# Patient Record
Sex: Female | Born: 1945 | Race: White | Hispanic: No | Smoking: Never smoker
Health system: Southern US, Community
[De-identification: ages and names within clinical notes are randomized; demographics above are authoritative.]

## PROBLEM LIST (undated history)

## (undated) DIAGNOSIS — E039 Hypothyroidism, unspecified: Secondary | ICD-10-CM

## (undated) DIAGNOSIS — I10 Essential (primary) hypertension: Secondary | ICD-10-CM

## (undated) DIAGNOSIS — C801 Malignant (primary) neoplasm, unspecified: Secondary | ICD-10-CM

## (undated) DIAGNOSIS — C50919 Malignant neoplasm of unspecified site of unspecified female breast: Secondary | ICD-10-CM

## (undated) DIAGNOSIS — N189 Chronic kidney disease, unspecified: Secondary | ICD-10-CM

## (undated) DIAGNOSIS — M1990 Unspecified osteoarthritis, unspecified site: Secondary | ICD-10-CM

## (undated) HISTORY — PX: ABDOMINAL HYSTERECTOMY: SHX81

## (undated) HISTORY — DX: Essential (primary) hypertension: I10

## (undated) HISTORY — DX: Malignant (primary) neoplasm, unspecified: C80.1

## (undated) HISTORY — DX: Unspecified osteoarthritis, unspecified site: M19.90

## (undated) HISTORY — PX: BREAST SURGERY: SHX581

## (undated) HISTORY — DX: Malignant neoplasm of unspecified site of unspecified female breast: C50.919

---

## 2000-12-07 ENCOUNTER — Other Ambulatory Visit: Admission: RE | Admit: 2000-12-07 | Discharge: 2000-12-07 | Payer: Self-pay

## 2000-12-20 ENCOUNTER — Ambulatory Visit: Admission: RE | Admit: 2000-12-20 | Discharge: 2000-12-20 | Payer: Self-pay | Admitting: Gynecology

## 2000-12-27 ENCOUNTER — Encounter: Payer: Self-pay | Admitting: Gynecology

## 2001-01-02 ENCOUNTER — Inpatient Hospital Stay (HOSPITAL_COMMUNITY): Admission: RE | Admit: 2001-01-02 | Discharge: 2001-01-04 | Payer: Self-pay

## 2001-01-02 ENCOUNTER — Encounter (INDEPENDENT_AMBULATORY_CARE_PROVIDER_SITE_OTHER): Payer: Self-pay

## 2001-01-02 ENCOUNTER — Encounter (INDEPENDENT_AMBULATORY_CARE_PROVIDER_SITE_OTHER): Payer: Self-pay | Admitting: Specialist

## 2001-01-09 ENCOUNTER — Ambulatory Visit: Admission: RE | Admit: 2001-01-09 | Discharge: 2001-01-09 | Payer: Self-pay | Admitting: Gynecology

## 2001-12-14 ENCOUNTER — Encounter: Payer: Self-pay | Admitting: Urology

## 2001-12-14 ENCOUNTER — Encounter: Admission: RE | Admit: 2001-12-14 | Discharge: 2001-12-14 | Payer: Self-pay | Admitting: Urology

## 2002-08-28 ENCOUNTER — Other Ambulatory Visit: Admission: RE | Admit: 2002-08-28 | Discharge: 2002-08-28 | Payer: Self-pay

## 2003-08-07 ENCOUNTER — Other Ambulatory Visit: Admission: RE | Admit: 2003-08-07 | Discharge: 2003-08-07 | Payer: Self-pay | Admitting: Obstetrics and Gynecology

## 2004-02-12 ENCOUNTER — Other Ambulatory Visit: Admission: RE | Admit: 2004-02-12 | Discharge: 2004-02-12 | Payer: Self-pay | Admitting: Obstetrics and Gynecology

## 2004-04-27 ENCOUNTER — Encounter: Admission: RE | Admit: 2004-04-27 | Discharge: 2004-04-27 | Payer: Self-pay | Admitting: General Surgery

## 2004-05-03 ENCOUNTER — Encounter: Admission: RE | Admit: 2004-05-03 | Discharge: 2004-05-03 | Payer: Self-pay | Admitting: General Surgery

## 2004-05-11 ENCOUNTER — Encounter: Admission: RE | Admit: 2004-05-11 | Discharge: 2004-05-11 | Payer: Self-pay | Admitting: General Surgery

## 2004-05-13 ENCOUNTER — Encounter (INDEPENDENT_AMBULATORY_CARE_PROVIDER_SITE_OTHER): Payer: Self-pay | Admitting: Specialist

## 2004-05-13 ENCOUNTER — Ambulatory Visit (HOSPITAL_COMMUNITY): Admission: RE | Admit: 2004-05-13 | Discharge: 2004-05-13 | Payer: Self-pay | Admitting: General Surgery

## 2004-05-13 ENCOUNTER — Ambulatory Visit (HOSPITAL_BASED_OUTPATIENT_CLINIC_OR_DEPARTMENT_OTHER): Admission: RE | Admit: 2004-05-13 | Discharge: 2004-05-13 | Payer: Self-pay | Admitting: General Surgery

## 2004-05-18 ENCOUNTER — Ambulatory Visit: Payer: Self-pay | Admitting: Oncology

## 2004-06-04 ENCOUNTER — Ambulatory Visit (HOSPITAL_COMMUNITY): Admission: RE | Admit: 2004-06-04 | Discharge: 2004-06-04 | Payer: Self-pay | Admitting: Oncology

## 2004-06-08 ENCOUNTER — Ambulatory Visit (HOSPITAL_COMMUNITY): Admission: RE | Admit: 2004-06-08 | Discharge: 2004-06-08 | Payer: Self-pay | Admitting: General Surgery

## 2004-06-08 ENCOUNTER — Ambulatory Visit (HOSPITAL_BASED_OUTPATIENT_CLINIC_OR_DEPARTMENT_OTHER): Admission: RE | Admit: 2004-06-08 | Discharge: 2004-06-08 | Payer: Self-pay | Admitting: General Surgery

## 2004-06-10 ENCOUNTER — Ambulatory Visit (HOSPITAL_COMMUNITY): Admission: RE | Admit: 2004-06-10 | Discharge: 2004-06-10 | Payer: Self-pay | Admitting: Oncology

## 2004-06-10 ENCOUNTER — Encounter (INDEPENDENT_AMBULATORY_CARE_PROVIDER_SITE_OTHER): Payer: Self-pay | Admitting: *Deleted

## 2004-07-07 ENCOUNTER — Ambulatory Visit: Payer: Self-pay | Admitting: Oncology

## 2004-08-26 ENCOUNTER — Ambulatory Visit: Payer: Self-pay | Admitting: Oncology

## 2004-10-13 ENCOUNTER — Ambulatory Visit: Payer: Self-pay | Admitting: Oncology

## 2004-10-19 ENCOUNTER — Ambulatory Visit: Payer: Self-pay | Admitting: Oncology

## 2004-10-20 ENCOUNTER — Ambulatory Visit: Admission: RE | Admit: 2004-10-20 | Discharge: 2004-12-22 | Payer: Self-pay | Admitting: Radiation Oncology

## 2004-12-08 ENCOUNTER — Ambulatory Visit: Payer: Self-pay | Admitting: Oncology

## 2005-03-02 ENCOUNTER — Other Ambulatory Visit: Admission: RE | Admit: 2005-03-02 | Discharge: 2005-03-02 | Payer: Self-pay | Admitting: Obstetrics and Gynecology

## 2005-03-10 ENCOUNTER — Ambulatory Visit: Payer: Self-pay | Admitting: Oncology

## 2005-06-21 ENCOUNTER — Ambulatory Visit: Payer: Self-pay | Admitting: Oncology

## 2005-06-23 LAB — COMPREHENSIVE METABOLIC PANEL
ALT: 17 U/L (ref 0–40)
AST: 18 U/L (ref 0–37)
Chloride: 105 mEq/L (ref 96–112)
Creatinine, Ser: 1 mg/dL (ref 0.4–1.2)
Sodium: 142 mEq/L (ref 135–145)
Total Bilirubin: 0.4 mg/dL (ref 0.3–1.2)

## 2005-06-23 LAB — CBC WITH DIFFERENTIAL/PLATELET
Basophils Absolute: 0 10*3/uL (ref 0.0–0.1)
EOS%: 3.8 % (ref 0.0–7.0)
HCT: 36.4 % (ref 34.8–46.6)
HGB: 12.2 g/dL (ref 11.6–15.9)
LYMPH%: 15 % (ref 14.0–48.0)
MCH: 29.7 pg (ref 26.0–34.0)
MCV: 88.4 fL (ref 81.0–101.0)
MONO%: 8.2 % (ref 0.0–13.0)
NEUT%: 72.8 % (ref 39.6–76.8)

## 2005-06-23 LAB — CANCER ANTIGEN 27.29: CA 27.29: 16 U/mL (ref 0–39)

## 2005-07-21 ENCOUNTER — Encounter: Admission: RE | Admit: 2005-07-21 | Discharge: 2005-07-21 | Payer: Self-pay | Admitting: Oncology

## 2005-08-09 ENCOUNTER — Ambulatory Visit: Payer: Self-pay | Admitting: Oncology

## 2005-09-27 ENCOUNTER — Ambulatory Visit: Payer: Self-pay | Admitting: Oncology

## 2005-09-29 LAB — CBC WITH DIFFERENTIAL/PLATELET
BASO%: 1.2 % (ref 0.0–2.0)
EOS%: 3.3 % (ref 0.0–7.0)
HCT: 36.6 % (ref 34.8–46.6)
LYMPH%: 18.7 % (ref 14.0–48.0)
MCH: 29.9 pg (ref 26.0–34.0)
MCHC: 33.4 g/dL (ref 32.0–36.0)
NEUT%: 70.4 % (ref 39.6–76.8)
RBC: 4.09 10*6/uL (ref 3.70–5.32)
lymph#: 1.2 10*3/uL (ref 0.9–3.3)

## 2005-09-29 LAB — COMPREHENSIVE METABOLIC PANEL
AST: 15 U/L (ref 0–37)
Alkaline Phosphatase: 67 U/L (ref 39–117)
BUN: 24 mg/dL — ABNORMAL HIGH (ref 6–23)
Creatinine, Ser: 0.83 mg/dL (ref 0.40–1.20)
Potassium: 4 mEq/L (ref 3.5–5.3)

## 2005-09-29 LAB — CANCER ANTIGEN 27.29: CA 27.29: 16 U/mL (ref 0–39)

## 2006-01-08 ENCOUNTER — Ambulatory Visit: Payer: Self-pay | Admitting: Oncology

## 2006-01-11 LAB — COMPREHENSIVE METABOLIC PANEL
ALT: 14 U/L (ref 0–35)
Albumin: 4.2 g/dL (ref 3.5–5.2)
CO2: 27 mEq/L (ref 19–32)
Chloride: 105 mEq/L (ref 96–112)
Glucose, Bld: 171 mg/dL — ABNORMAL HIGH (ref 70–99)
Potassium: 3.7 mEq/L (ref 3.5–5.3)
Sodium: 142 mEq/L (ref 135–145)
Total Bilirubin: 0.3 mg/dL (ref 0.3–1.2)
Total Protein: 6.9 g/dL (ref 6.0–8.3)

## 2006-01-11 LAB — CBC WITH DIFFERENTIAL/PLATELET
BASO%: 0.3 % (ref 0.0–2.0)
Basophils Absolute: 0 10*3/uL (ref 0.0–0.1)
HCT: 36.8 % (ref 34.8–46.6)
HGB: 12.1 g/dL (ref 11.6–15.9)
LYMPH%: 12.5 % — ABNORMAL LOW (ref 14.0–48.0)
MCHC: 32.8 g/dL (ref 32.0–36.0)
MONO#: 0.4 10*3/uL (ref 0.1–0.9)
NEUT%: 77.2 % — ABNORMAL HIGH (ref 39.6–76.8)
Platelets: 197 10*3/uL (ref 145–400)
WBC: 6.1 10*3/uL (ref 3.9–10.0)

## 2006-01-11 LAB — LACTATE DEHYDROGENASE: LDH: 165 U/L (ref 94–250)

## 2006-01-16 ENCOUNTER — Encounter: Admission: RE | Admit: 2006-01-16 | Discharge: 2006-01-16 | Payer: Self-pay | Admitting: Oncology

## 2006-02-18 ENCOUNTER — Ambulatory Visit: Payer: Self-pay | Admitting: Oncology

## 2006-04-10 ENCOUNTER — Ambulatory Visit: Payer: Self-pay | Admitting: Oncology

## 2006-05-11 LAB — COMPREHENSIVE METABOLIC PANEL
BUN: 18 mg/dL (ref 6–23)
CO2: 26 mEq/L (ref 19–32)
Creatinine, Ser: 0.88 mg/dL (ref 0.40–1.20)
Glucose, Bld: 142 mg/dL — ABNORMAL HIGH (ref 70–99)
Sodium: 144 mEq/L (ref 135–145)
Total Bilirubin: 0.3 mg/dL (ref 0.3–1.2)
Total Protein: 6.7 g/dL (ref 6.0–8.3)

## 2006-05-11 LAB — CBC WITH DIFFERENTIAL/PLATELET
Basophils Absolute: 0 10*3/uL (ref 0.0–0.1)
Eosinophils Absolute: 0.2 10*3/uL (ref 0.0–0.5)
HCT: 35 % (ref 34.8–46.6)
LYMPH%: 13.8 % — ABNORMAL LOW (ref 14.0–48.0)
MONO#: 0.5 10*3/uL (ref 0.1–0.9)
NEUT#: 5.7 10*3/uL (ref 1.5–6.5)
NEUT%: 76.2 % (ref 39.6–76.8)
Platelets: 207 10*3/uL (ref 145–400)
WBC: 7.5 10*3/uL (ref 3.9–10.0)

## 2006-06-27 ENCOUNTER — Ambulatory Visit: Payer: Self-pay | Admitting: Oncology

## 2006-07-27 ENCOUNTER — Encounter: Admission: RE | Admit: 2006-07-27 | Discharge: 2006-07-27 | Payer: Self-pay | Admitting: Oncology

## 2006-09-11 ENCOUNTER — Ambulatory Visit: Payer: Self-pay | Admitting: Oncology

## 2006-09-14 LAB — COMPREHENSIVE METABOLIC PANEL
Albumin: 3.8 g/dL (ref 3.5–5.2)
Alkaline Phosphatase: 62 U/L (ref 39–117)
BUN: 24 mg/dL — ABNORMAL HIGH (ref 6–23)
Glucose, Bld: 154 mg/dL — ABNORMAL HIGH (ref 70–99)
Potassium: 3.8 mEq/L (ref 3.5–5.3)
Total Bilirubin: 0.3 mg/dL (ref 0.3–1.2)

## 2006-09-14 LAB — CBC WITH DIFFERENTIAL/PLATELET
Basophils Absolute: 0 10*3/uL (ref 0.0–0.1)
Eosinophils Absolute: 0.3 10*3/uL (ref 0.0–0.5)
HGB: 11.8 g/dL (ref 11.6–15.9)
LYMPH%: 14.7 % (ref 14.0–48.0)
MCV: 88.4 fL (ref 81.0–101.0)
MONO%: 7.2 % (ref 0.0–13.0)
NEUT#: 4.5 10*3/uL (ref 1.5–6.5)
Platelets: 175 10*3/uL (ref 145–400)

## 2006-09-14 LAB — LACTATE DEHYDROGENASE: LDH: 149 U/L (ref 94–250)

## 2006-09-14 LAB — CANCER ANTIGEN 27.29: CA 27.29: 12 U/mL (ref 0–39)

## 2006-10-31 ENCOUNTER — Ambulatory Visit: Payer: Self-pay | Admitting: Oncology

## 2006-12-12 ENCOUNTER — Ambulatory Visit: Payer: Self-pay | Admitting: Oncology

## 2006-12-19 ENCOUNTER — Encounter: Admission: RE | Admit: 2006-12-19 | Discharge: 2006-12-19 | Payer: Self-pay | Admitting: Oncology

## 2007-01-11 ENCOUNTER — Other Ambulatory Visit: Admission: RE | Admit: 2007-01-11 | Discharge: 2007-01-11 | Payer: Self-pay | Admitting: Obstetrics and Gynecology

## 2007-01-23 ENCOUNTER — Ambulatory Visit: Payer: Self-pay | Admitting: Oncology

## 2007-02-01 ENCOUNTER — Encounter: Admission: RE | Admit: 2007-02-01 | Discharge: 2007-02-01 | Payer: Self-pay | Admitting: Oncology

## 2007-03-01 LAB — CBC WITH DIFFERENTIAL/PLATELET
Basophils Absolute: 0.1 10*3/uL (ref 0.0–0.1)
Eosinophils Absolute: 0.3 10*3/uL (ref 0.0–0.5)
HGB: 11.9 g/dL (ref 11.6–15.9)
LYMPH%: 14.8 % (ref 14.0–48.0)
MONO#: 0.5 10*3/uL (ref 0.1–0.9)
NEUT#: 5.6 10*3/uL (ref 1.5–6.5)
Platelets: 228 10*3/uL (ref 145–400)
RBC: 4.09 10*6/uL (ref 3.70–5.32)
RDW: 14.8 % — ABNORMAL HIGH (ref 11.3–14.5)
WBC: 7.5 10*3/uL (ref 3.9–10.0)

## 2007-03-01 LAB — COMPREHENSIVE METABOLIC PANEL
Albumin: 3.8 g/dL (ref 3.5–5.2)
CO2: 25 mEq/L (ref 19–32)
Calcium: 9.1 mg/dL (ref 8.4–10.5)
Glucose, Bld: 158 mg/dL — ABNORMAL HIGH (ref 70–99)
Potassium: 3.6 mEq/L (ref 3.5–5.3)
Sodium: 142 mEq/L (ref 135–145)
Total Protein: 6.6 g/dL (ref 6.0–8.3)

## 2007-03-01 LAB — LACTATE DEHYDROGENASE: LDH: 149 U/L (ref 94–250)

## 2007-03-01 LAB — CANCER ANTIGEN 27.29: CA 27.29: 15 U/mL (ref 0–39)

## 2007-04-02 ENCOUNTER — Ambulatory Visit: Payer: Self-pay | Admitting: Oncology

## 2007-04-23 ENCOUNTER — Ambulatory Visit (HOSPITAL_BASED_OUTPATIENT_CLINIC_OR_DEPARTMENT_OTHER): Admission: RE | Admit: 2007-04-23 | Discharge: 2007-04-23 | Payer: Self-pay | Admitting: General Surgery

## 2007-08-02 ENCOUNTER — Encounter: Admission: RE | Admit: 2007-08-02 | Discharge: 2007-08-02 | Payer: Self-pay | Admitting: Oncology

## 2007-12-13 ENCOUNTER — Ambulatory Visit: Payer: Self-pay | Admitting: Oncology

## 2007-12-17 LAB — CBC WITH DIFFERENTIAL/PLATELET
BASO%: 0.3 % (ref 0.0–2.0)
EOS%: 1.6 % (ref 0.0–7.0)
MCH: 29.5 pg (ref 26.0–34.0)
MCHC: 33.6 g/dL (ref 32.0–36.0)
MONO%: 7.1 % (ref 0.0–13.0)
RBC: 4.33 10*6/uL (ref 3.70–5.32)
RDW: 15.3 % — ABNORMAL HIGH (ref 11.3–14.5)
lymph#: 1.1 10*3/uL (ref 0.9–3.3)

## 2007-12-18 LAB — COMPREHENSIVE METABOLIC PANEL
ALT: 14 U/L (ref 0–35)
AST: 13 U/L (ref 0–37)
Albumin: 4.2 g/dL (ref 3.5–5.2)
Calcium: 9.1 mg/dL (ref 8.4–10.5)
Chloride: 103 mEq/L (ref 96–112)
Creatinine, Ser: 0.94 mg/dL (ref 0.40–1.20)
Potassium: 3.7 mEq/L (ref 3.5–5.3)

## 2008-06-17 ENCOUNTER — Ambulatory Visit: Payer: Self-pay | Admitting: Oncology

## 2008-06-19 LAB — CBC WITH DIFFERENTIAL/PLATELET
Basophils Absolute: 0 10*3/uL (ref 0.0–0.1)
Eosinophils Absolute: 0.3 10*3/uL (ref 0.0–0.5)
HGB: 12.1 g/dL (ref 11.6–15.9)
MCV: 89.3 fL (ref 79.5–101.0)
MONO%: 6.4 % (ref 0.0–14.0)
NEUT#: 5.4 10*3/uL (ref 1.5–6.5)
RDW: 14.6 % — ABNORMAL HIGH (ref 11.2–14.5)

## 2008-06-20 LAB — COMPREHENSIVE METABOLIC PANEL
Albumin: 4 g/dL (ref 3.5–5.2)
Alkaline Phosphatase: 64 U/L (ref 39–117)
BUN: 22 mg/dL (ref 6–23)
CO2: 24 mEq/L (ref 19–32)
Calcium: 9.2 mg/dL (ref 8.4–10.5)
Chloride: 105 mEq/L (ref 96–112)
Glucose, Bld: 158 mg/dL — ABNORMAL HIGH (ref 70–99)
Potassium: 3.7 mEq/L (ref 3.5–5.3)

## 2008-06-20 LAB — CANCER ANTIGEN 27.29: CA 27.29: 11 U/mL (ref 0–39)

## 2008-06-20 LAB — VITAMIN D 25 HYDROXY (VIT D DEFICIENCY, FRACTURES): Vit D, 25-Hydroxy: 46 ng/mL (ref 30–89)

## 2008-06-20 LAB — LACTATE DEHYDROGENASE: LDH: 139 U/L (ref 94–250)

## 2008-08-28 ENCOUNTER — Encounter: Admission: RE | Admit: 2008-08-28 | Discharge: 2008-08-28 | Payer: Self-pay | Admitting: Oncology

## 2008-12-25 ENCOUNTER — Encounter: Admission: RE | Admit: 2008-12-25 | Discharge: 2008-12-25 | Payer: Self-pay | Admitting: Oncology

## 2008-12-30 ENCOUNTER — Ambulatory Visit: Payer: Self-pay | Admitting: Oncology

## 2009-01-01 ENCOUNTER — Ambulatory Visit (HOSPITAL_COMMUNITY): Admission: RE | Admit: 2009-01-01 | Discharge: 2009-01-01 | Payer: Self-pay | Admitting: Oncology

## 2009-01-01 LAB — CBC WITH DIFFERENTIAL/PLATELET
BASO%: 0.7 % (ref 0.0–2.0)
EOS%: 2.9 % (ref 0.0–7.0)
LYMPH%: 16.7 % (ref 14.0–49.7)
MCHC: 32.8 g/dL (ref 31.5–36.0)
MCV: 91.6 fL (ref 79.5–101.0)
MONO%: 7.4 % (ref 0.0–14.0)
Platelets: 210 10*3/uL (ref 145–400)
RBC: 3.92 10*6/uL (ref 3.70–5.45)

## 2009-01-02 LAB — COMPREHENSIVE METABOLIC PANEL
ALT: 13 U/L (ref 0–35)
Alkaline Phosphatase: 62 U/L (ref 39–117)
Sodium: 140 mEq/L (ref 135–145)
Total Bilirubin: 0.3 mg/dL (ref 0.3–1.2)
Total Protein: 6.9 g/dL (ref 6.0–8.3)

## 2009-01-02 LAB — CANCER ANTIGEN 27.29: CA 27.29: 11 U/mL (ref 0–39)

## 2009-07-15 ENCOUNTER — Ambulatory Visit: Payer: Self-pay | Admitting: Oncology

## 2009-07-16 LAB — CBC WITH DIFFERENTIAL/PLATELET
BASO%: 0.4 % (ref 0.0–2.0)
Eosinophils Absolute: 0.1 10*3/uL (ref 0.0–0.5)
HCT: 35.4 % (ref 34.8–46.6)
MCHC: 34.2 g/dL (ref 31.5–36.0)
MONO#: 0.4 10*3/uL (ref 0.1–0.9)
NEUT#: 4.8 10*3/uL (ref 1.5–6.5)
NEUT%: 73.4 % (ref 38.4–76.8)
WBC: 6.5 10*3/uL (ref 3.9–10.3)
lymph#: 1.2 10*3/uL (ref 0.9–3.3)

## 2009-07-17 LAB — COMPREHENSIVE METABOLIC PANEL
ALT: 15 U/L (ref 0–35)
CO2: 24 mEq/L (ref 19–32)
Calcium: 9.4 mg/dL (ref 8.4–10.5)
Chloride: 102 mEq/L (ref 96–112)
Creatinine, Ser: 1.06 mg/dL (ref 0.40–1.20)
Glucose, Bld: 143 mg/dL — ABNORMAL HIGH (ref 70–99)
Sodium: 140 mEq/L (ref 135–145)
Total Protein: 6.8 g/dL (ref 6.0–8.3)

## 2009-07-17 LAB — LACTATE DEHYDROGENASE: LDH: 147 U/L (ref 94–250)

## 2009-09-03 ENCOUNTER — Encounter: Admission: RE | Admit: 2009-09-03 | Discharge: 2009-09-03 | Payer: Self-pay | Admitting: General Surgery

## 2010-02-25 ENCOUNTER — Ambulatory Visit: Payer: Self-pay | Admitting: Oncology

## 2010-02-25 LAB — CBC WITH DIFFERENTIAL/PLATELET
BASO%: 0.2 % (ref 0.0–2.0)
Basophils Absolute: 0 10*3/uL (ref 0.0–0.1)
EOS%: 2.5 % (ref 0.0–7.0)
Eosinophils Absolute: 0.2 10*3/uL (ref 0.0–0.5)
HCT: 34.9 % (ref 34.8–46.6)
HGB: 11.6 g/dL (ref 11.6–15.9)
LYMPH%: 14.7 % (ref 14.0–49.7)
MCH: 29.9 pg (ref 25.1–34.0)
MCHC: 33.2 g/dL (ref 31.5–36.0)
MCV: 90 fL (ref 79.5–101.0)
MONO#: 0.5 10*3/uL (ref 0.1–0.9)
MONO%: 6.9 % (ref 0.0–14.0)
NEUT#: 5.2 10*3/uL (ref 1.5–6.5)
NEUT%: 75.7 % (ref 38.4–76.8)
Platelets: 219 10*3/uL (ref 145–400)
RBC: 3.88 10*6/uL (ref 3.70–5.45)
RDW: 15.5 % — ABNORMAL HIGH (ref 11.2–14.5)
WBC: 6.9 10*3/uL (ref 3.9–10.3)
lymph#: 1 10*3/uL (ref 0.9–3.3)

## 2010-02-26 LAB — COMPREHENSIVE METABOLIC PANEL
ALT: 13 U/L (ref 0–35)
AST: 17 U/L (ref 0–37)
Albumin: 4.1 g/dL (ref 3.5–5.2)
Alkaline Phosphatase: 65 U/L (ref 39–117)
BUN: 29 mg/dL — ABNORMAL HIGH (ref 6–23)
CO2: 25 mEq/L (ref 19–32)
Calcium: 9.5 mg/dL (ref 8.4–10.5)
Chloride: 103 mEq/L (ref 96–112)
Creatinine, Ser: 1.34 mg/dL — ABNORMAL HIGH (ref 0.40–1.20)
Glucose, Bld: 124 mg/dL — ABNORMAL HIGH (ref 70–99)
Potassium: 3.9 mEq/L (ref 3.5–5.3)
Sodium: 141 mEq/L (ref 135–145)
Total Bilirubin: 0.4 mg/dL (ref 0.3–1.2)
Total Protein: 7.1 g/dL (ref 6.0–8.3)

## 2010-02-26 LAB — LACTATE DEHYDROGENASE: LDH: 160 U/L (ref 94–250)

## 2010-02-26 LAB — VITAMIN D 25 HYDROXY (VIT D DEFICIENCY, FRACTURES): Vit D, 25-Hydroxy: 116 ng/mL — ABNORMAL HIGH (ref 30–89)

## 2010-02-26 LAB — CANCER ANTIGEN 27.29: CA 27.29: 30 U/mL (ref 0–39)

## 2010-03-14 ENCOUNTER — Encounter: Payer: Self-pay | Admitting: Oncology

## 2010-04-08 ENCOUNTER — Other Ambulatory Visit: Payer: Self-pay | Admitting: Oncology

## 2010-04-08 DIAGNOSIS — Z853 Personal history of malignant neoplasm of breast: Secondary | ICD-10-CM

## 2010-04-08 DIAGNOSIS — Z9889 Other specified postprocedural states: Secondary | ICD-10-CM

## 2010-04-08 DIAGNOSIS — Z780 Asymptomatic menopausal state: Secondary | ICD-10-CM

## 2010-06-29 ENCOUNTER — Encounter (INDEPENDENT_AMBULATORY_CARE_PROVIDER_SITE_OTHER): Payer: Self-pay | Admitting: General Surgery

## 2010-07-06 NOTE — Op Note (Signed)
[name], Monica Morrison              ACCOUNT NO.:  [id number]   MEDICAL RECORD NO.:  [id number]          PATIENT TYPE:  AMB   LOCATION:  DSC                          FACILITY:  [facility]   PHYSICIAN:  Angelia Mould. Derrell Lolling, M.D.DATE OF BIRTH:  Mar 29, 1945   DATE OF PROCEDURE:  04/23/2007  DATE OF DISCHARGE:                               OPERATIVE REPORT   PREOPERATIVE DIAGNOSIS:  Breast cancer.   POSTOPERATIVE DIAGNOSIS:  Breast cancer.   OPERATION PERFORMED:  Removal of Port-A-Cath.   SURGEON:  Angelia Mould. Derrell Lolling, M.D.   OPERATIVE INDICATIONS:  This is a 65 year old white female who underwent  a left partial mastectomy and left axillary lymph node dissection on  May 13, 2004.  She had 26 positive lymph nodes, stage T2, N2, receptor  positive.  She had chemotherapy and radiation therapy and is taking  Arimidex and is followed Dr. Pierce Crane.  She was referred back to me  at this time for removal of her port.  I have discussed the indications  and details and risks of this procedure with her, and she is very  comfortable and wants to go ahead with this.   OPERATIVE TECHNIQUE:  The patient was brought to the minor procedure  room at [facility].  She was placed supine on the operating  table.  I could palpate the port in the right infraclavicular area.  This area was prepped and draped in a sterile fashion.  The patient was  identified as correct patient, correct procedure. and correct site.  1%  Xylocaine with epinephrine was utilized as a local infiltration  anesthetic.   A transverse incision was made overlying the port, through the previous  scar.  Dissection was carried down through the subcutaneous tissue until  I encountered the capsule of the port.  The port was mobilized and  lifted up.  With a knife, I cut two Prolene sutures and then dissected  the port and the locking device and the catheter away from the scar  tissue, and I removed the catheter and the port  intact.  There was no  bleeding.  There was no sign of any fracture of the port or catheter.  The subcutaneous tissue was closed with interrupted sutures of 3-0  Vicryl.  The skin was closed with a running subcuticular suture of 4-0  Monocryl and Steri-Strips.  Clean bandages were placed and the patient  taken to the recovery room in stable condition.   ESTIMATED BLOOD LOSS:  About 2 mL.   COMPLICATIONS:  None.   COUNTS:  Sponge, needle, and instrument counts were correct.     Angelia Mould. Derrell Lolling, M.D.  Electronically Signed    HMI/MEDQ  D:  04/23/2007  T:  04/23/2007  Job:  [id number]

## 2010-07-09 NOTE — Discharge Summary (Signed)
[facility]  Patient:    Monica Morrison, Monica Morrison Visit Number: [id number] MRN: [id number]          Service Type: Attending:  [name]. Galen Daft, M.D. Dictated by:   [name]. Galen Daft, M.D.                             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  ADMITTING DIAGNOSIS:  Endometrial cancer.  PRINCIPAL DIAGNOSIS:  Endometrial cancer.  SECONDARY DIAGNOSES: 1. Diabetes. 2. High blood pressure.  PRINCIPAL PROCEDURE:  Total abdominal hysterectomy with bilateral salpingo-oophorectomy, lymph node sampling, and exploration.  SURGEONS:  [name]. Galen Daft, M.D., Rande Brunt. Clarke-Pearson, M.D.  ANESTHESIA:  General.  CONDITION AT DISCHARGE:  Improved and there were no complications of hospitalization.  HOSPITAL COURSE:  Patient was admitted for the planned procedure of hysterectomy with lymph node biopsies and oophorectomy.  The procedure went without difficulty.  The patient had a normal postoperative course with excellent return of bowel function.  She was discharged home on postoperative day #2 with full instructions regarding wound care, follow-up in the office, medications, and diet.  Final pathology came back with grade 1, stage I endometrial cancer with minimal invasion.  All lymph nodes were negative and there was no metastatic disease. Dictated by:   [name]. Galen Daft, M.D. Attending:  [name]. Galen Daft, M.D. DD:  01/11/01 TD:  01/11/01 Job: [id number]

## 2010-07-09 NOTE — Op Note (Signed)
Monica Morrison, Monica Morrison              ACCOUNT NO.:  [id number]   MEDICAL RECORD NO.:  [id number]          PATIENT TYPE:  AMB   LOCATION:  DSC                          FACILITY:  [facility]   PHYSICIAN:  Rose Phi. Maple Hudson, M.D.   DATE OF BIRTH:  October 25, 1945   DATE OF PROCEDURE:  06/08/2004  DATE OF DISCHARGE:                                 OPERATIVE REPORT   PREOPERATIVE DIAGNOSIS:  Carcinoma of the left breast.   POSTOPERATIVE DIAGNOSIS:  Carcinoma of the left breast.   OPERATION:  Insertion of Port-A-Cath under fluoroscopic control.   SURGEON:  Rose Phi. Maple Hudson, M.D.   ANESTHESIA:  MAC.   OPERATIVE PROCEDURE:  The patient was placed on the operating table with  arms down by the side and the right upper chest and neck prepped and draped  in the usual fashion.   Under local anesthesia, a right subclavian puncture was carried out without  difficulty and the guidewire inserted and proper positioning of it confirmed  by C-arm fluoroscopy.   Also under local anesthesia, I made an incision on the anterior chest wall  and developed a pocket for the implantable port. We then tunneled from the  puncture site and the pocket and passed the catheter through that and  connected it to the Bard export which was placed in the pocket and anchored  in place with two 2-0 Prolene sutures.   The catheter tip was then trimmed to go to the level of the fourth  interspace at the cavoatrial junction. We then passed a dilator and peel-  away sheath over the wire and removed the wire and the dilator and passed  the catheter through the peel-away sheath and then removed it.   Again, fluoroscopic control was used to be sure the tip was in the superior  vena cava and that there was no kinking in the system.   With this confirmed, we then closed incisions with 3-0 Vicryl and  subcuticular 4-0 Monocryl and Steri-Strips.   I then accessed the system to heparinize it fully and then remove the  needle.   Dressings  were applied and the patient transferred to the recovery room in  satisfactory condition having tolerated the procedure well.      PRY/MEDQ  D:  06/08/2004  T:  06/08/2004  Job:  [id number]

## 2010-07-09 NOTE — Op Note (Signed)
Monica Morrison, Monica Morrison          ACCOUNT NO.:  [id number]   MEDICAL RECORD NO.:  [id number]          PATIENT TYPE:  AMB   LOCATION:  DSC                          FACILITY:  [facility]   PHYSICIAN:  Rose Phi. Maple Hudson, M.D.   DATE OF BIRTH:  1945/12/15   DATE OF PROCEDURE:  05/13/2004  DATE OF DISCHARGE:                                 OPERATIVE REPORT   PREOPERATIVE DIAGNOSIS:  Stage II carcinoma left breast.   POSTOPERATIVE DIAGNOSIS:  Stage II carcinoma left breast.   OPERATION:  Left partial mastectomy with axillary lymph node dissection.   SURGEON:  Dr. Francina Ames.   ANESTHESIA:  General.   OPERATIVE PROCEDURE:  This patient had presented with a palpable mass in the  subareolar area of the left breast and a positive lymph node proven by  needle biopsy. She was then scheduled for partial mastectomy and an axillary  node dissection.   After suitable general endotracheal anesthesia was induced, the patient was  placed in the supine position with the arms extended on the arm board. After  prepping and draping the breast and axilla, a transverse elliptical incision  was outlined in the central portion of the breast, incorporating the  palpable mass as well as the nipple-areolar complex.  The incision was made  and I excised this central portion of the breast. This was oriented for the  pathologist and submitted for evaluation of the margins.   While that was being done, a transverse axillary incision was made with  dissection through the subcutaneous tissue to the clavipectoral fascia to  enter the low axilla. We exposed the pectoralis major muscle and then  dissected along it in a cephalad direction retracting it and exposing the  pectoralis minor and the clavipectoral fascia at the level of the axillary  vein. With both major and minor retracted and the vein exposed, we then  dissected out all the tissue inferior to the vein and from behind the minor  so that we got level 1 and  level 2 nodes. There were multiple palpable  positive nodes.  The long thoracic, thoracodorsal nerves were identified and  preserved and other vessels and nerves were clipped and divided.   Following the completion of axillary node dissection, we had good  hemostasis. I thoroughly irrigated the field with saline. A #19-French Blake  drain was inserted and brought out through a separate stab wound. We then  injected the subcutaneous tissue with the local anesthetic mixture, then  closed it in 2 layers of 3-0 Vicryl and staples.   The margins on the lumpectomy site showed some tumor at the deep margin and  I excised more deep margin, which put [place] on the pectoralis fascia at the  level of muscle. Then I was concerned about the inferolateral margin which  also was very close, so I excised more there to get a clear margin.   Following a completion of this with good hemostasis and thorough irrigation,  I again injected the tissue with the local anesthetic and then closed it in  2 layers with 3-0 Vicryl and staples.  Dressings were then applied  and the  patient transferred to the recovery room in satisfactory condition, having  tolerated procedure well.      PRY/MEDQ  D:  05/13/2004  T:  05/13/2004  Job:  [id number]

## 2010-07-09 NOTE — Consult Note (Signed)
[facility]  Patient:    Monica Morrison, Monica Morrison Visit Number: [id number] MRN: [id number]          Service Type: GON Location: GYN Attending Physician:  Jeannette Corpus Dictated by:   Rande Brunt. Clarke-Pearson, M.D. Proc. Date: 12/20/00 Admit Date:  12/20/2000   CC:         [name]. Galen Daft, M.D.             Meredith Staggers, M.D.             Telford Nab, R.N.                          Consultation Report  REASON FOR CONSULTATION:  [age]-year-old white female referred by Dr. [name]. Tuso for evaluation and management of a grade 2 endometrial carcinoma.  The patient had postmenopausal bleeding and was referred to Dr. Galen Daft, who obtained an endometrial biopsy showing a grade 2 endometrial adenocarcinoma.  She has minimal cramping but has no other pelvic symptoms. She denies any GI or GU symptoms, any weight loss or any constitutional symptoms.  The patient has no past gynecologic history.  PAST MEDICAL HISTORY:  Medical illnesses:  Hypertension, diabetes, and thyroid disease.  PAST SURGICAL HISTORY:  Thyroidectomy, tonsils and adenoidectomy, excision of cutaneous nevi.  OBSTETRICAL HISTORY:  Gravida 2 with twins who are 66 years of age.  SOCIAL HISTORY:  Patient does not smoke.  She works as a IT sales professional at [employer].  She lives with her mother who is in her 66s.  CURRENT MEDICATIONS: 1. Potassium chloride 8 mg b.i.d. 2. Verapamil ER 240 mg daily. 3. Levoxyl 200 mcg every day. 4. Glucovance 2.5/500 mg b.i.d. 5. Metoprolol 50 mg b.i.d. 6. Triamterene/hydrochlorothiazide 37.5/25 mg daily.  The patient had previously been taking Evista but that has been discontinued just recently; she has also been asked to discontinue taking baby aspirin in anticipation of surgery.  FAMILY HISTORY:  Family history is negative for gynecologic or colon cancer. The patient has a maternal aunt with breast cancer.  REVIEW OF SYSTEMS:   Essentially negative.  PHYSICAL EXAMINATION:  VITAL SIGNS:  Height 5 foot 3 inches.  Weight 163 pounds.  Blood pressure 148/90, pulse 70, respiratory rate 18.  GENERAL:  The patient is a healthy white female in no acute distress.  HEENT:  Negative.  NECK:  Supple without thyromegaly.  NODES:  There is no supraclavicular or inguinal adenopathy.  ABDOMEN:  Soft and nontender.  No masses, organomegaly, ascites or herniae are noted.  PELVIC:  EGBUS are normal.  Vagina is clean, well-supported.  Cervix is parous and clean.  No lesions are noted.  Bimanual and rectovaginal exam reveal no masses, induration or nodularity.  Her uterus is anterior, normal shape, size and consistency.  IMPRESSION:  Clinical stage I, grade 2 endometrial adenocarcinoma.  PLAN:  I recommend that the patient undergo exploratory laparotomy, total abdominal hysterectomy and bilateral salpingo-oophorectomy and probable pelvic and para-aortic lymphadenectomy for surgical treatment and staging.  We did discuss the risks of surgery including hemorrhage, infection, injury to adjacent viscera and thromboembolic complications as well as anesthetic risks and the increased risk of poor wound healing, given the fact that the patient is diabetic and moderately overweight.  She is further made aware that based on surgical findings, it may be recommended that she receive adjuvant therapy in the terms of radiation and/or chemotherapy.  All the patients questions are asked and answered.  We will proceed with surgery on November 12th in conjunction with Dr. Gwendalyn Ege. Dictated by:   Rande Brunt. Clarke-Pearson, M.D. Attending Physician:  Jeannette Corpus DD:  12/20/00 TD:  12/21/00 Job: [id number]

## 2010-07-09 NOTE — Discharge Summary (Signed)
[facility]  Patient:    Monica Morrison, MELLS Visit Number: [id number] MRN: [id number]          Service Type: GON Location: GYN Attending Physician:  Jeannette Corpus Dictated by:   [name]. Galen Daft, M.D. Admit Date:  01/09/2001 Discharge Date: 01/09/2001                             Discharge Summary  PREOPERATIVE DIAGNOSIS:  Endometrial cancer.  POSTOPERATIVE DIAGNOSIS:  Endometrial cancer, stage 1, grade 1.  PRINCIPAL PROCEDURE:  Total abdominal hysterectomy, bilateral salpingo-oophorectomy, and lymph node dissection.  SECONDARY PROCEDURE:  None.  COMPLICATIONS OF HOSPITALIZATION:  None.  CONDITION ON DISCHARGE:  Stable.  HOSPITAL COURSE:  The patient was admitted on January 02, 2001, she underwent a total abdominal hysterectomy, bilateral salpingo-oophorectomy, with lymph node dissection.  The surgery came back as endometrial cancer, stage 1, grade 1, and there was no evidence of metastatic disease.  She had a normal return to bowel function, and was discharged home after two postoperative days with full instructions regarding activity limits, wound care, followup in the office, and medications.  The patient had no complications. Dictated by:   [name]. Galen Daft, M.D. Attending Physician:  Jeannette Corpus DD:  03/21/01 TD:  03/21/01 Job: [id number]

## 2010-07-09 NOTE — Op Note (Signed)
[facility]  Patient:    Monica Morrison, Monica Morrison Visit Number: [id number] MRN: [id number]          Service Type: GYN Location: 4W [id number] [id number] Attending Physician:  Barbaraann Cao Proc. Date: 01/02/01 Admit Date:  01/02/2001   CC:         [name]. Galen Daft, M.D.  Telford Nab, R.N.   Operative Report  PREOPERATIVE DIAGNOSIS:  Grade 2 endometrial adenocarcinoma.  POSTOPERATIVE DIAGNOSIS:  Grade 2 endometrial adenocarcinoma.  PROCEDURE:  Exploratory laparotomy, total abdominal hysterectomy, bilateral salpingo-oophorectomy, pelvic and periaortic lymphadenectomy.  SURGEON:  Daniel L. Clarke-Pearson, M.D.  ASSISTANTS: 1. [name]. Galen Daft, M.D. 2. Telford Nab, R.N.  ANESTHESIA:  General with orotracheal tube.  ESTIMATED BLOOD LOSS:  150 cc.  SURGICAL FINDINGS:  At exploratory laparotomy, the upper abdomen including the diaphragm, liver, spleen, stomach, omentum, small and large bowel were normal. The only abnormality was that she had thickening of the sigmoid colon consistent with diverticulosis.  The uterus was normal.  The tubes and ovaries appeared normal.  The pelvic and periaortic lymph nodes appeared normal.  On frozen section, there was minimal myometrial invasion noted.  DESCRIPTION OF PROCEDURE:  The patient was brought to the operating room and placed under general anesthesia.  She was positioned in [place] stirrups in the modified lithotomy position.  The anterior abdominal wall, perineum, and vagina were prepped with Betadine, Foley catheter was placed, and the patient was draped.  The abdomen was entered through a midline incision.  Peritoneal washings were obtained from the pelvis.  The upper abdomen and pelvis explored with the above-noted findings.  A Bookwalter retractor was positioned, and the bowel was packed out of the pelvis.  The uterus was grasped with long Kelly clamps, the round ligaments were divided, and the  retroperitoneal space is opened, identifying the external iliac artery and vein, iliac artery and ureter.  The paravesical and pararectal spaces were opened.  The ureter was identified, the ovarian vessels isolated, clamped, cut, free-tied, and suture ligated using 2-0 Vicryl.  The bladder flap was advanced through sharp dissection.  The uterine vessels were skeletonized, clamped, cut, and suture ligated.  In a stepwise fashion, the paracervical and cardinal ligaments were clamped, cut, and suture ligated.  The pararectal space and rectovaginal septum were isolated and the uterosacral ligaments identified.  The uterosacral ligaments and vaginal angles were cross-clamped and divided.  The vagina was transected from its connection to the cervix.  The uterus, cervix, tubes and ovaries are handed off the operative field as a single specimen and submitted to frozen section.  The vaginal angles and the uterosacral ligaments were transfixed with 0 Vicryl.  The central portion of the vagina is closed with interrupted figure-of-eight sutures using 2-0 Vicryl.  Attention was turned to performing a pelvic lymphadenectomy. Bilateral pelvic lymphadenectomy was performed, removing lymph nodes from the external iliac artery and vein, internal iliac artery and obturator space.  These were submitted as two separate specimens with the external iliac nodes and obturator lymph nodes.  Throughout the dissection, hemostasis was achieved with hemoclips and electrocautery.  Care was taken to avoid injury to any of the blood vessels and the obturator nerve.  The Bookwalter retractor was repositioned.  The peritoneum overlying the right common iliac artery and aorta was identified and incised with cautery.  The dissection was carried from the right common iliac artery up to the retroperitoneal portion of the duodenum.  Further dissection identified the right ureter which was  reflected laterally, thus exposing lymph  nodes overlying the aorta and vena cava. Further dissection elevated the retroperitoneal portion of the duodenum to allow more cephalad access.  The lymph nodes over the aorta and vena cava were then excised with care taken to avoid injury to the inferior mesenteric artery.  The right ovarian artery was encountered and doubly clipped in the course of this dissection.  The lymph nodes were entirely excised and submitted to pathology as a single specimen.  The hemostasis in the para-aortic chain region was ascertained.  Attention was turned to the pelvis which was reinspected, hemostasis achieved. The pelvis was irrigated with saline.  The retractors and packs were removed, and the anterior abdominal wall closed in layers, the first being a running mass closure using #1 PDS.  The subcutaneous tissue was irrigated, hemostasis achieved with the cautery, and the skin closed with skin staples.  A dressing was applied.  The patient was taken to the recovery room in satisfactory condition.  Sponge, needle, and instrument counts correct x 2. Attending Physician:  Barbaraann Cao DD:  01/02/01 TD:  01/02/01 Job: [id number]

## 2010-07-09 NOTE — Consult Note (Signed)
[facility]  Patient:    Monica Morrison, Monica Morrison Visit Number: [id number] MRN: [id number]          Service Type: GON Location: GYN Attending Physician:  Jeannette Corpus Dictated by:   Rande Brunt. Clarke-Pearson, M.D. Proc. Date: 01/09/01 Admit Date:  01/09/2001   CC:         [name]. Galen Daft, M.D.  Telford Nab, R.N.   Consultation Report  REASON FOR FOLLOWUP:  [age]-year-old white female returns for her first postoperative checkup, having undergone a total abdominal hysterectomy, bilateral salpingo-oophorectomy and pelvic and para-aortic lymphadenectomy for endometrial carcinoma on January 02, 2001.  Final pathology showed a well-differentiated endometrial carcinoma (preoperative grade was grade 2) with focal invasion of the superficial myometrium.  All lymph nodes and washings were negative (stage IB, grade 1).  The patient has had an uncomplicated postoperative course.  PHYSICAL EXAMINATION:  ABDOMEN:  Soft and nontender.  Staples are removed from her midline incision and Steri-Strips are applied.  IMPRESSION:  Stage IB, grade 2 endometrial adenocarcinoma.  The patient is having a good postoperative recovery.  She will return to see Dr. [name]. Tuso for a six-week checkup and thereafter, would recommend that she be followed in his office with surveillance for a very low risk of recurrence.  We would suggest she have visits every three months in the first year, visits every four months in the second year and then every six months to complete five years of followup.  I would be happy to see her in followup as needed, however, her primary followup will be provided in Dr. Mardi Mainland office. Dictated by:   Rande Brunt. Clarke-Pearson, M.D. Attending Physician:  Jeannette Corpus DD:  01/09/01 TD:  01/10/01 Job: [id number]

## 2010-09-09 ENCOUNTER — Ambulatory Visit
Admission: RE | Admit: 2010-09-09 | Discharge: 2010-09-09 | Disposition: A | Discharge status: Home / Self Care | Payer: Medicare Other | Source: Ambulatory Visit | Attending: Oncology | Admitting: Oncology

## 2010-09-09 DIAGNOSIS — Z9889 Other specified postprocedural states: Secondary | ICD-10-CM

## 2010-11-01 ENCOUNTER — Encounter (INDEPENDENT_AMBULATORY_CARE_PROVIDER_SITE_OTHER): Payer: Self-pay | Admitting: General Surgery

## 2010-12-23 ENCOUNTER — Ambulatory Visit (INDEPENDENT_AMBULATORY_CARE_PROVIDER_SITE_OTHER): Payer: Medicare Other | Admitting: General Surgery

## 2010-12-23 ENCOUNTER — Encounter (INDEPENDENT_AMBULATORY_CARE_PROVIDER_SITE_OTHER): Payer: Self-pay | Admitting: General Surgery

## 2010-12-23 VITALS — BP 128/84 | HR 80 | Temp 98.2°F | Resp 16 | Wt 166.2 lb

## 2010-12-23 DIAGNOSIS — Z853 Personal history of malignant neoplasm of breast: Secondary | ICD-10-CM

## 2010-12-23 NOTE — Progress Notes (Signed)
Chief Complaint  Patient presents with  . Follow-up    Breast Cancer  [facility] - Left  PM; ALND 05/13/2004(Dr. PY) - T3,N1; 26 nodes pos, ER Pos., Her-2 neg.    HPI Monica Morrison is a 65 y.o. female.   HPI This pleasant woman returns for long-term followup regarding her breast cancer. She is followed annually by Dr. Pierce Crane every January. Her primary physician is Dr. Tally Joe.  On May 13, 2004 she underwent left partial mastectomy and left axillary lymph node dissection by Dr. Francina Ames. Pathologic stage is T2, N2, but was 26 nodes positive, receptor positive and HER-2 negative. She has no known recurrence to date. She had adjuvant chemotherapy. Her port has been removed. She had radiation therapy and she is on adjuvant anastrozole.  Her medical problems include arthritis, and non-insulin-dependent diabetes, hypertension.  Bilateral mammograms were performed on September 09, 2010. They show some scarring on the left buttock category 2 with no evidence of malignancy. She has no complaints about her breasts. She has very little in the way of left arm symptoms.  Past Medical History  Diagnosis Date  . Cancer     breast  . Hypertension   . Diabetes mellitus   . Arthritis     Past Surgical History  Procedure Date  . Abdominal hysterectomy   . Breast surgery     Family History  Problem Relation Age of Onset  . Diabetes Mother   . Hypertension Mother   . Diabetes Sister   . Hypertension Sister     Social History History  Substance Use Topics  . Smoking status: Never Smoker   . Smokeless tobacco: Not on file  . Alcohol Use: No    No Known Allergies  Current Outpatient Prescriptions  Medication Sig Dispense Refill  . alendronate (FOSAMAX) 70 MG tablet Take 70 mg by mouth every 7 (seven) days. Take with a full glass of water on an empty stomach.       Marland Kitchen anastrozole (ARIMIDEX) 1 MG tablet Take 1 mg by mouth daily.        . Calcium Carbonate (CALCIUM 500 PO) Take by  mouth.        Marland Kitchen glipiZIDE (GLUCOTROL) 5 MG tablet Take 5 mg by mouth 2 (two) times daily before a meal.        . levothyroxine (SYNTHROID, LEVOTHROID) 112 MCG tablet Take 112 mcg by mouth daily.        . metFORMIN (GLUMETZA) 500 MG (MOD) 24 hr tablet Take 500 mg by mouth daily with breakfast.        . METOPROLOL TARTRATE PO Take by mouth.        Marland Kitchen POTASSIUM PO Take by mouth.        . TRIAMTERENE-HCTZ PO Take by mouth.        . verapamil (COVERA HS) 240 MG (CO) 24 hr tablet Take 240 mg by mouth at bedtime.        . Flaxseed, Linseed, (FLAX SEED OIL) 1000 MG CAPS Take by mouth.        . LYSINE PO Take 20 mg by mouth daily.        . pioglitazone (ACTOS) 15 MG tablet Take 15 mg by mouth daily.          Review of Systems Review of Systems  Constitutional: Negative for fever, chills and unexpected weight change.  HENT: Negative for hearing loss, congestion, sore throat, trouble swallowing and voice change.   Eyes:  Negative for visual disturbance.  Respiratory: Negative for cough and wheezing.   Cardiovascular: Negative for chest pain, palpitations and leg swelling.  Gastrointestinal: Negative for nausea, vomiting, abdominal pain, diarrhea, constipation, blood in stool, abdominal distention and anal bleeding.  Genitourinary: Negative for hematuria, vaginal bleeding and difficulty urinating.  Musculoskeletal: Positive for arthralgias.  Skin: Negative for rash and wound.  Neurological: Negative for seizures, syncope and headaches.  Hematological: Negative for adenopathy. Does not bruise/bleed easily.  Psychiatric/Behavioral: Negative for confusion.    Blood pressure 128/84, pulse 80, temperature 98.2 F (36.8 C), temperature source Temporal, resp. rate 16, weight 166 lb 3.2 oz (75.388 kg).  Physical Exam Physical Exam  Eyes: Pupils are equal, round, and reactive to light. Right eye exhibits no discharge. Left eye exhibits no discharge. No scleral icterus.       Right conjunctiva slightly  erythrematous. ? allergies.  Neck: Normal range of motion. Neck supple. No JVD present. No tracheal deviation present. No thyromegaly present.       Old thyroidectomy scar  Cardiovascular: Normal rate, regular rhythm, normal heart sounds and intact distal pulses.   No murmur heard. Pulmonary/Chest: Effort normal and breath sounds normal. No stridor. No respiratory distress. She has no wheezes. She has no rales. She exhibits no tenderness.    Lymphadenopathy:    She has no cervical adenopathy.  Skin: Skin is warm and dry. No rash noted. No pallor.       Slight erythema left axilla. Chronic.    Data Reviewed I have reviewed her mammograms performed September 09, 2010.  Assessment    Invasive lobular carcinoma left breast, stage T2, N2 , receptor positive, 26 positive lymph nodes, no evidence of recurrence  6 years post op  Non-insulin-dependent diabetes mellitus  Hypertension     Plan    We had a long talk about long-term follow up and surveillance. She has excellent general medical care with Dr. Tally Joe and she sees Dr. Donnie Coffin annually. She was given the option of continuing to follow with me or see me on a p.r.n. basis. At this point in time her decision is to see Dr. Donnie Coffin annually and to see me on a p.r.n. basis. All standby should any further surgical problems arise.  Annual mammograms       Rubbie Goostree M 12/23/2010, 8:58 AM

## 2010-12-23 NOTE — Patient Instructions (Signed)
Your mammograms looked normal and your physical exam shows no evidence of recurrent cancer. I advise you do get mammograms every year in July. I advise you to continue annual followup with Dr. Donnie Coffin. Return to see me as needed.

## 2010-12-30 ENCOUNTER — Ambulatory Visit
Admission: RE | Admit: 2010-12-30 | Discharge: 2010-12-30 | Disposition: A | Discharge status: Home / Self Care | Payer: Medicare Other | Source: Ambulatory Visit | Attending: Oncology | Admitting: Oncology

## 2010-12-30 DIAGNOSIS — Z780 Asymptomatic menopausal state: Secondary | ICD-10-CM

## 2010-12-30 DIAGNOSIS — Z853 Personal history of malignant neoplasm of breast: Secondary | ICD-10-CM

## 2011-02-09 ENCOUNTER — Encounter: Payer: Self-pay | Admitting: Oncology

## 2011-02-11 ENCOUNTER — Telehealth: Payer: Self-pay | Admitting: Oncology

## 2011-02-11 NOTE — Telephone Encounter (Signed)
l.m for pt,appt r/s to later timwe and mailed appt info    aom

## 2011-02-24 ENCOUNTER — Other Ambulatory Visit: Payer: Self-pay | Admitting: Oncology

## 2011-02-24 ENCOUNTER — Encounter: Payer: Self-pay | Admitting: Oncology

## 2011-02-24 ENCOUNTER — Other Ambulatory Visit (HOSPITAL_BASED_OUTPATIENT_CLINIC_OR_DEPARTMENT_OTHER): Payer: Medicare Other | Admitting: Lab

## 2011-02-24 DIAGNOSIS — C50119 Malignant neoplasm of central portion of unspecified female breast: Secondary | ICD-10-CM

## 2011-02-24 LAB — CBC WITH DIFFERENTIAL/PLATELET
BASO%: 0.5 % (ref 0.0–2.0)
EOS%: 3.9 % (ref 0.0–7.0)
MCH: 29.2 pg (ref 25.1–34.0)
MCHC: 33.2 g/dL (ref 31.5–36.0)
MCV: 88 fL (ref 79.5–101.0)
MONO%: 7 % (ref 0.0–14.0)
RBC: 3.9 10*6/uL (ref 3.70–5.45)
RDW: 14.9 % — ABNORMAL HIGH (ref 11.2–14.5)

## 2011-02-25 LAB — COMPREHENSIVE METABOLIC PANEL
AST: 17 U/L (ref 0–37)
Albumin: 4.1 g/dL (ref 3.5–5.2)
Alkaline Phosphatase: 68 U/L (ref 39–117)
BUN: 30 mg/dL — ABNORMAL HIGH (ref 6–23)
Potassium: 3.9 mEq/L (ref 3.5–5.3)
Sodium: 142 mEq/L (ref 135–145)

## 2011-03-03 ENCOUNTER — Ambulatory Visit (HOSPITAL_BASED_OUTPATIENT_CLINIC_OR_DEPARTMENT_OTHER): Payer: Medicare Other | Admitting: Oncology

## 2011-03-03 VITALS — BP 162/95 | HR 72 | Temp 97.7°F | Ht 62.5 in | Wt 168.7 lb

## 2011-03-03 DIAGNOSIS — C50919 Malignant neoplasm of unspecified site of unspecified female breast: Secondary | ICD-10-CM

## 2011-03-03 NOTE — Progress Notes (Signed)
Hematology and Oncology Follow Up Visit  Monica Morrison [id number] Feb 09, 1946 66 y.o. 03/03/2011 3:30 PM PCP dr Tally Joe  Principle Diagnosis: multi-node + BC, s/p lumpectomy 2006; s/p TAC x 6, s/p xrt completed 9/06; on arimidex since.  Interim History:  There have been no intercurrent illness, hospitalizations or medication changes.  Medications: I have reviewed the patient's current medications.  Allergies: No Known Allergies  Past Medical History, Surgical history, Social history, and Family History were reviewed and updated.  Review of Systems: Constitutional:  Negative for fever, chills, night sweats, anorexia, weight loss, pain. Cardiovascular: no chest pain or dyspnea on exertion Respiratory: no cough, shortness of breath, or wheezing Neurological: negative Dermatological: negative ENT: negative Skin Gastrointestinal: no abdominal pain, change in bowel habits, or black or bloody stools Genito-Urinary: no dysuria, trouble voiding, or hematuria Hematological and Lymphatic: negative Breast: negative Musculoskeletal: negative Remaining ROS negative.  Physical Exam: Blood pressure 162/95, pulse 72, temperature 97.7 F (36.5 C), temperature source Oral, height 5' 2.5" (1.588 m), weight 168 lb 11.2 oz (76.522 kg). ECOG:  General appearance: alert, cooperative and appears stated age Head: Normocephalic, without obvious abnormality, atraumatic Neck: no adenopathy, no carotid bruit, no JVD, supple, symmetrical, trachea midline and thyroid not enlarged, symmetric, no tenderness/mass/nodules Lymph nodes: Cervical, supraclavicular, and axillary nodes normal. Cardiac : regular rate and rhythm, no murmurs or gallops Pulmonary:clear to auscultation bilaterally and normal percussion bilaterally Breasts: inspection negative, no nipple discharge or bleeding, no masses or nodularity palpable Abdomen:soft, non-tender; bowel sounds normal; no masses,  no organomegaly Extremities  negative Neuro: alert, oriented, normal speech, no focal findings or movement disorder noted  Lab Results: Lab Results  Component Value Date   WBC 6.3 02/24/2011   HGB 11.4* 02/24/2011   HCT 34.3* 02/24/2011   MCV 88.0 02/24/2011   PLT 197 02/24/2011     Chemistry      Component Value Date/Time   NA 142 02/24/2011 1233   NA 142 02/24/2011 1233   NA 142 02/24/2011 1233   K 3.9 02/24/2011 1233   K 3.9 02/24/2011 1233   K 3.9 02/24/2011 1233   CL 105 02/24/2011 1233   CL 105 02/24/2011 1233   CL 105 02/24/2011 1233   CO2 27 02/24/2011 1233   CO2 27 02/24/2011 1233   CO2 27 02/24/2011 1233   BUN 30* 02/24/2011 1233   BUN 30* 02/24/2011 1233   BUN 30* 02/24/2011 1233   CREATININE 1.39* 02/24/2011 1233   CREATININE 1.39* 02/24/2011 1233   CREATININE 1.39* 02/24/2011 1233      Component Value Date/Time   CALCIUM 9.4 02/24/2011 1233   CALCIUM 9.4 02/24/2011 1233   CALCIUM 9.4 02/24/2011 1233   ALKPHOS 68 02/24/2011 1233   ALKPHOS 68 02/24/2011 1233   ALKPHOS 68 02/24/2011 1233   AST 17 02/24/2011 1233   AST 17 02/24/2011 1233   AST 17 02/24/2011 1233   ALT 13 02/24/2011 1233   ALT 13 02/24/2011 1233   ALT 13 02/24/2011 1233   BILITOT 0.4 02/24/2011 1233   BILITOT 0.4 02/24/2011 1233   BILITOT 0.4 02/24/2011 1233      .pathology. Radiological Studies: chest X-ray n/a Mammogram Due 7/13 Bone density Due 11/14  Impression and Plan: Monica Morrison is doing well tolerating her arimidex and is free of disease. i will see her in 1 yr with appropriate imaging.  More than 50% of the visit was spent in patient-related counselling   Pierce Crane, MD 1/10/20133:30 PM

## 2011-10-12 ENCOUNTER — Ambulatory Visit
Admission: RE | Admit: 2011-10-12 | Discharge: 2011-10-12 | Disposition: A | Discharge status: Home / Self Care | Payer: Medicare Other | Source: Ambulatory Visit | Attending: Oncology | Admitting: Oncology

## 2011-10-12 ENCOUNTER — Other Ambulatory Visit: Payer: Self-pay | Admitting: Oncology

## 2011-10-12 DIAGNOSIS — C50919 Malignant neoplasm of unspecified site of unspecified female breast: Secondary | ICD-10-CM

## 2012-02-16 ENCOUNTER — Telehealth: Payer: Self-pay | Admitting: *Deleted

## 2012-02-16 NOTE — Telephone Encounter (Signed)
patient confirmed over the phone the appointment on the 03-02-2012 has been cancelled

## 2012-03-02 ENCOUNTER — Ambulatory Visit: Payer: Medicare Other | Admitting: Oncology

## 2012-03-02 ENCOUNTER — Other Ambulatory Visit: Payer: Medicare Other | Admitting: Lab

## 2012-03-06 ENCOUNTER — Telehealth: Payer: Self-pay | Admitting: Oncology

## 2012-03-06 NOTE — Telephone Encounter (Signed)
Left a message to schedule follow up.

## 2012-03-07 ENCOUNTER — Other Ambulatory Visit: Payer: Self-pay | Admitting: Oncology

## 2012-03-07 DIAGNOSIS — Z853 Personal history of malignant neoplasm of breast: Secondary | ICD-10-CM

## 2012-03-07 DIAGNOSIS — C50512 Malignant neoplasm of lower-outer quadrant of left female breast: Secondary | ICD-10-CM | POA: Insufficient documentation

## 2012-03-12 ENCOUNTER — Telehealth: Payer: Self-pay | Admitting: Oncology

## 2012-03-12 ENCOUNTER — Other Ambulatory Visit (HOSPITAL_BASED_OUTPATIENT_CLINIC_OR_DEPARTMENT_OTHER): Payer: Medicare Other | Admitting: Lab

## 2012-03-12 ENCOUNTER — Ambulatory Visit (HOSPITAL_BASED_OUTPATIENT_CLINIC_OR_DEPARTMENT_OTHER): Payer: Medicare Other | Admitting: Gynecologic Oncology

## 2012-03-12 VITALS — BP 178/103 | HR 79 | Temp 97.9°F | Resp 20 | Ht 62.5 in | Wt 170.6 lb

## 2012-03-12 DIAGNOSIS — C50119 Malignant neoplasm of central portion of unspecified female breast: Secondary | ICD-10-CM

## 2012-03-12 DIAGNOSIS — M949 Disorder of cartilage, unspecified: Secondary | ICD-10-CM

## 2012-03-12 DIAGNOSIS — C773 Secondary and unspecified malignant neoplasm of axilla and upper limb lymph nodes: Secondary | ICD-10-CM

## 2012-03-12 DIAGNOSIS — Z853 Personal history of malignant neoplasm of breast: Secondary | ICD-10-CM

## 2012-03-12 DIAGNOSIS — M899 Disorder of bone, unspecified: Secondary | ICD-10-CM

## 2012-03-12 LAB — COMPREHENSIVE METABOLIC PANEL (CC13)
BUN: 22 mg/dL (ref 7.0–26.0)
CO2: 27 mEq/L (ref 22–29)
Creatinine: 1.3 mg/dL — ABNORMAL HIGH (ref 0.6–1.1)
Glucose: 131 mg/dl — ABNORMAL HIGH (ref 70–99)
Total Bilirubin: 0.4 mg/dL (ref 0.20–1.20)

## 2012-03-12 LAB — CBC WITH DIFFERENTIAL/PLATELET
BASO%: 0.8 % (ref 0.0–2.0)
Eosinophils Absolute: 0.2 10*3/uL (ref 0.0–0.5)
HCT: 37.3 % (ref 34.8–46.6)
LYMPH%: 12.1 % — ABNORMAL LOW (ref 14.0–49.7)
MCHC: 32.9 g/dL (ref 31.5–36.0)
MONO#: 0.6 10*3/uL (ref 0.1–0.9)
NEUT%: 77.4 % — ABNORMAL HIGH (ref 38.4–76.8)
Platelets: 235 10*3/uL (ref 145–400)
WBC: 8.3 10*3/uL (ref 3.9–10.3)

## 2012-03-12 MED ORDER — ANASTROZOLE 1 MG PO TABS: 1.0000 mg | ORAL_TABLET | Freq: Every day | ORAL | Status: DC

## 2012-03-12 NOTE — Telephone Encounter (Signed)
gv pt appt schedule for September, mammo appt for 8/22 and bone density test for 11/10. Checked w/[facility] (APP) and test pt needs is bone density test not bone scan. Per [facility] pt not due to bone density til November 2014. Bone density schedule for November [facility] made aware.

## 2012-03-12 NOTE — Progress Notes (Signed)
OFFICE PROGRESS NOTE  Sissy Hoff, MD [address] [place] [address]  HPI:  Monica Morrison is a 67 year old female who presented to her primary care physician in February of 2006 because she noted a mass behind her nipple on the left breast.  A mammogram performed on 04/22/2004 compared to a mammogram performed on 08/28/03 revealed a 1.2 cm nodular mass-like density in the anterior left upper outer breast.  There was increase in the anterior left breast with spiculation noted in the craniocaudal margin imaged.  Ultrasound of the breast showed an irregular, angular, hypoechoic solid mass, measuring well 14 mm in height.  Vascularity with lesions and posterior shadowing was also noted along with an enlarged lymph node in the axilla.  After referral to the breast center, a biopsy was performed on 04/27/2004 revealing invasive mammary carcinoma from both the subareolar mass as well as the left axillary mass.  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  S/p 6 q3 week cycles of adjuvant TAC with start date of 06/17/04 (Adriamycin held with the first cycle due to the unavailability of an echo at that time).  She completed radiation in 10/2004 and was started on Arimidex at that time.     CURRENT THERAPY:  Arimidex 1 mg daily since September of 2006.  INTERVAL HISTORY: Monica Morrison 67 y.o. female returns for continued follow up.  She voices no complaints since last visit.  She reports that she is feeling anxious today about her visit because she "hopes everything is ok."  She reports that she is tolerating Arimidex well with no hot flashes or vaginal dryness.  Her last mammogram was in August of 2013 and bone density in 12/2010.  She complains of intermittent joint pain but she relates this to a history of arthritis.  She has been taking Fosamax to address osteopenia noted on a  DXA scan in November of 2012 without difficulties.  She reports that her diabetes is under good control with her Hbg A1C around 6.    MEDICAL HISTORY: Past Medical History  Diagnosis Date  . Cancer     breast  . Hypertension   . Diabetes mellitus   . Arthritis     ALLERGIES:   has no known allergies.  MEDICATIONS:  Current Outpatient Prescriptions  Medication Sig Dispense Refill  . alendronate (FOSAMAX) 70 MG tablet Take 70 mg by mouth every 7 (seven) days. Take with a full glass of water on an empty stomach.       . ALPRAZolam (XANAX) 0.25 MG tablet Take 0.25 mg by mouth at bedtime as needed.      Marland Kitchen anastrozole (ARIMIDEX) 1 MG tablet Take 1 tablet (1 mg total) by mouth daily.  90 tablet  12  . atorvastatin (LIPITOR) 20 MG tablet Take 20 mg by mouth daily.      . Calcium Carbonate (CALCIUM 500 PO) Take by mouth.        . Flaxseed, Linseed, (FLAX SEED OIL) 1000 MG CAPS Take by mouth.        Marland Kitchen glipiZIDE (GLUCOTROL) 5 MG tablet Take 10 mg by mouth daily.       Marland Kitchen levothyroxine (SYNTHROID, LEVOTHROID) 112 MCG tablet Take 112 mcg by mouth daily.        Marland Kitchen LYSINE PO Take 20 mg by mouth daily.        Marland Kitchen  metFORMIN (GLUMETZA) 500 MG (MOD) 24 hr tablet Take 1,000 mg by mouth 2 (two) times daily with a meal.       . METOPROLOL TARTRATE PO Take 50 mg by mouth 2 (two) times daily.       . pioglitazone (ACTOS) 15 MG tablet Take 15 mg by mouth daily.        Marland Kitchen POTASSIUM PO Take by mouth 2 (two) times daily.       . TRIAMTERENE-HCTZ PO Take by mouth daily.       . verapamil (COVERA HS) 240 MG (CO) 24 hr tablet Take 240 mg by mouth at bedtime.          SURGICAL HISTORY:  Past Surgical History  Procedure Date  . Abdominal hysterectomy   . Breast surgery     REVIEW OF SYSTEMS:   Constitutional: Feels well.  Cardiovascular: No chest pain, shortness of breath, or edema.  Pulmonary: No cough or wheeze.  Gastrointestinal: No nausea, vomiting, or diarrhea. No bright red blood per rectum or change in  bowel movement.  Genitourinary: No frequency, urgency, or dysuria. No vaginal bleeding or discharge.  Musculoskeletal: Intermittent joint pain related to arthritis.  No myalgia. Neurologic: No weakness, numbness, or change in gait.  Psychology: No depression, anxiety, or insomnia  HEALTH MAINTENANCE: Mammogram: 09/2011 Colonoscopy:  Arrange by Dr. Damaris Schooner Density Scan:  12/2010  PHYSICAL EXAMINATION: Blood pressure 178/103, pulse 79, temperature 97.9 F (36.6 C), resp. rate 20, height 5' 2.5" (1.588 m), weight 170 lb 9.6 oz (77.384 kg). Body mass index is 30.71 kg/(m^2). ECOG PERFORMANCE STATUS: 1 - Symptomatic but completely ambulatory  General: Well developed, well nourished female in no acute distress. Alert and oriented x 3.  Repeat BP of 158/92 with patient reporting decrease in anxiety.  Head/ Neck: Oropharynx clear.  Sclerae anticteric.  Supple without any enlargements.  Lymph node survey: No cervical, supraclavicular, or inguinal adenopathy  Cardiovascular: Regular rate and rhythm. S1 and S2 normal.  Lungs: Clear to auscultation bilaterally. No wheezes/crackles/rhonchi noted.  Skin: No rashes or lesions present. Back: No CVA tenderness  Abdomen: Abdomen soft, non-tender and obese. Active bowel sounds in all quadrants. No evidence of a fluid wave or abdominal masses. Breast:  Scar to the left breast noted with no masses, discharge, or nodularity noted.  Right breast unremarkable with no masses, erythema, discharge, or nodularity noted.  Extremities: No bilateral cyanosis, edema, or clubbing.     LABORATORY DATA: Lab Results  Component Value Date   WBC 8.3 03/12/2012   HGB 12.3 03/12/2012   HCT 37.3 03/12/2012   MCV 87.1 03/12/2012   PLT 235 03/12/2012      Chemistry      Component Value Date/Time   NA 135* 03/12/2012 1310   NA 142 02/24/2011 1233   NA 142 02/24/2011 1233   NA 142 02/24/2011 1233   K 3.7 03/12/2012 1310   K 3.9 02/24/2011 1233   K 3.9 02/24/2011 1233   K 3.9  02/24/2011 1233   CL 97* 03/12/2012 1310   CL 105 02/24/2011 1233   CL 105 02/24/2011 1233   CL 105 02/24/2011 1233   CO2 27 03/12/2012 1310   CO2 27 02/24/2011 1233   CO2 27 02/24/2011 1233   CO2 27 02/24/2011 1233   BUN 22.0 03/12/2012 1310   BUN 30* 02/24/2011 1233   BUN 30* 02/24/2011 1233   BUN 30* 02/24/2011 1233   CREATININE 1.3* 03/12/2012 1310   CREATININE 1.39*  02/24/2011 1233   CREATININE 1.39* 02/24/2011 1233   CREATININE 1.39* 02/24/2011 1233      Component Value Date/Time   CALCIUM 10.7* 03/12/2012 1310   CALCIUM 9.4 02/24/2011 1233   CALCIUM 9.4 02/24/2011 1233   CALCIUM 9.4 02/24/2011 1233   ALKPHOS 73 03/12/2012 1310   ALKPHOS 68 02/24/2011 1233   ALKPHOS 68 02/24/2011 1233   ALKPHOS 68 02/24/2011 1233   AST 16 03/12/2012 1310   AST 17 02/24/2011 1233   AST 17 02/24/2011 1233   AST 17 02/24/2011 1233   ALT 13 03/12/2012 1310   ALT 13 02/24/2011 1233   ALT 13 02/24/2011 1233   ALT 13 02/24/2011 1233   BILITOT 0.40 03/12/2012 1310   BILITOT 0.4 02/24/2011 1233   BILITOT 0.4 02/24/2011 1233   BILITOT 0.4 02/24/2011 1233       RADIOGRAPHIC STUDIES:  No results found.  ASSESSMENT:  #69  67 year old female with T2 N2 stage IIIA left breast carcinoma s/p left partial mastectomy with axillary lymph node dissection on 05/13/2004.  She underwent 6 q3 week cycles of adjuvant TAC with start date of 06/17/04 (Adriamycin held with the first cycle due to the unavailability of an echo at that time).  She completed radiation in 10/2004 and was started on Arimidex at that time.     #2 Moderate fracture risk, low bone mass, and osteopenia on DXA scan in 12/2010 PLAN:  #1  She is to follow up with Dr. Welton Flakes for future care in September 2014 with a mammogram in August and a bone density scan.  Arimidex therapy will be re-evaluated in September 2014 based on bone density scan results and the risks vs the benefits since she has been taking Arimidex since September of 2006.     #2  She is continue taking Fosamax as ordered per Dr.  Azucena Cecil.  She is to have a repeat bone density scan before her appointment in September 2014 to re-evaluate Arimidex therapy.  All questions were answered. The patient knows to call the clinic with any problems, questions or concerns. We can certainly see the patient much sooner if necessary.  I spent 30 minutes counseling the patient face to face. Dr. Welton Flakes spoke with the patient for 10 minutes about future care.  The total time spent in the appointment including abstracting past information was 2 hours.  Warner Mccreedy, NP Medical/[facility]cology [facility] [phone] (Office)  03/12/2012, 5:11 PM

## 2012-03-13 ENCOUNTER — Encounter: Payer: Self-pay | Admitting: Gynecologic Oncology

## 2012-03-13 NOTE — Patient Instructions (Addendum)
Follow up with Dr. Welton Flakes in September 2014 with a mammogram and bone density in August 2014.  Please call for any questions or concerns.  Continue taking Arimidex as ordered.

## 2012-03-26 ENCOUNTER — Other Ambulatory Visit: Payer: Medicare Other | Admitting: Lab

## 2012-03-26 ENCOUNTER — Ambulatory Visit: Payer: Medicare Other | Admitting: Gynecologic Oncology

## 2012-09-11 ENCOUNTER — Other Ambulatory Visit: Payer: Self-pay | Admitting: Family Medicine

## 2012-09-11 ENCOUNTER — Ambulatory Visit
Admission: RE | Admit: 2012-09-11 | Discharge: 2012-09-11 | Disposition: A | Discharge status: Home / Self Care | Payer: Medicare Other | Source: Ambulatory Visit | Attending: Family Medicine | Admitting: Family Medicine

## 2012-09-11 DIAGNOSIS — Q766 Other congenital malformations of ribs: Secondary | ICD-10-CM

## 2012-10-12 ENCOUNTER — Ambulatory Visit
Admission: RE | Admit: 2012-10-12 | Discharge: 2012-10-12 | Disposition: A | Discharge status: Home / Self Care | Payer: Medicare Other | Source: Ambulatory Visit | Attending: Gynecologic Oncology | Admitting: Gynecologic Oncology

## 2012-10-12 DIAGNOSIS — Z853 Personal history of malignant neoplasm of breast: Secondary | ICD-10-CM

## 2012-11-05 ENCOUNTER — Ambulatory Visit (HOSPITAL_BASED_OUTPATIENT_CLINIC_OR_DEPARTMENT_OTHER): Payer: Medicare Other | Admitting: Oncology

## 2012-11-05 ENCOUNTER — Encounter: Payer: Self-pay | Admitting: Oncology

## 2012-11-05 ENCOUNTER — Telehealth: Payer: Self-pay | Admitting: *Deleted

## 2012-11-05 VITALS — BP 147/86 | HR 91 | Temp 97.8°F | Resp 20 | Ht 62.5 in | Wt 170.5 lb

## 2012-11-05 DIAGNOSIS — Z853 Personal history of malignant neoplasm of breast: Secondary | ICD-10-CM

## 2012-11-05 NOTE — Progress Notes (Signed)
OFFICE PROGRESS NOTE  Sissy Hoff, MD [address] [place] [address]  HPI:  Monica Morrison is a 67 year old female who presented to her primary care physician in February of 2006 because she noted a mass behind her nipple on the left breast.  A mammogram performed on 04/22/2004 compared to a mammogram performed on 08/28/03 revealed a 1.2 cm nodular mass-like density in the anterior left upper outer breast.  There was increase in the anterior left breast with spiculation noted in the craniocaudal margin imaged.  Ultrasound of the breast showed an irregular, angular, hypoechoic solid mass, measuring well 14 mm in height.  Vascularity with lesions and posterior shadowing was also noted along with an enlarged lymph node in the axilla.  After referral to the breast center, a biopsy was performed on 04/27/2004 revealing invasive mammary carcinoma from both the subareolar mass as well as the left axillary mass.  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  S/p 6 q3 week cycles of adjuvant TAC with start date of 06/17/04 (Adriamycin held with the first cycle due to the unavailability of an echo at that time).  She completed radiation in 10/2004 and was started on Arimidex at that time.     CURRENT THERAPY:  Arimidex 1 mg daily since September of 2006.  INTERVAL HISTORY: Monica Morrison 67 y.o. female returns for continued follow up.  She voices no complaints since last visit.  She reports that she is feeling anxious today about her visit because she "hopes everything is ok."  She reports that she is tolerating Arimidex well with no hot flashes or vaginal dryness.    She complains of intermittent joint pain but she relates this to a history of arthritis.  She has been taking Fosamax to address osteopenia noted on a DXA scan in November of 2012 without difficulties.    MEDICAL  HISTORY: Past Medical History  Diagnosis Date  . Cancer     breast  . Hypertension   . Diabetes mellitus   . Arthritis   . Breast cancer     ALLERGIES:  has No Known Allergies.  MEDICATIONS:  Current Outpatient Prescriptions  Medication Sig Dispense Refill  . alendronate (FOSAMAX) 70 MG tablet Take 70 mg by mouth every 7 (seven) days. Take with a full glass of water on an empty stomach.       . ALPRAZolam (XANAX) 0.25 MG tablet Take 0.25 mg by mouth at bedtime as needed.      Marland Kitchen anastrozole (ARIMIDEX) 1 MG tablet Take 1 tablet (1 mg total) by mouth daily.  90 tablet  12  . atorvastatin (LIPITOR) 20 MG tablet Take 20 mg by mouth daily.      . Calcium Carbonate (CALCIUM 500 PO) Take by mouth.        Marland Kitchen glipiZIDE (GLUCOTROL) 5 MG tablet Take 10 mg by mouth daily.       Marland Kitchen levothyroxine (SYNTHROID, LEVOTHROID) 112 MCG tablet Take 112 mcg by mouth daily.        . metFORMIN (GLUMETZA) 500 MG (MOD) 24 hr tablet Take 1,000 mg by mouth 2 (two) times daily with a meal.       . METOPROLOL TARTRATE PO Take 50 mg by mouth 2 (two) times daily.       . Multiple Vitamins-Minerals (MULTIVITAMIN PO) Take by mouth.      Marland Kitchen  pioglitazone (ACTOS) 15 MG tablet Take 15 mg by mouth daily.        Marland Kitchen POTASSIUM PO Take by mouth 2 (two) times daily.       . TRIAMTERENE-HCTZ PO Take by mouth daily.       . verapamil (COVERA HS) 240 MG (CO) 24 hr tablet Take 240 mg by mouth at bedtime.         No current facility-administered medications for this visit.    SURGICAL HISTORY:  Past Surgical History  Procedure Laterality Date  . Abdominal hysterectomy    . Breast surgery      REVIEW OF SYSTEMS:   Constitutional: Feels well.  Cardiovascular: No chest pain, shortness of breath, or edema.  Pulmonary: No cough or wheeze.  Gastrointestinal: No nausea, vomiting, or diarrhea. No bright red blood per rectum or change in bowel movement.  Genitourinary: No frequency, urgency, or dysuria. No vaginal bleeding or  discharge.  Musculoskeletal: Intermittent joint pain related to arthritis.  No myalgia. Neurologic: No weakness, numbness, or change in gait.  Psychology: No depression, anxiety, or insomnia  HEALTH MAINTENANCE: Mammogram: 09/2011 Colonoscopy:  Arrange by Dr. Verlee Monte Scan:  12/2010  PHYSICAL EXAMINATION: Blood pressure 147/86, pulse 91, temperature 97.8 F (36.6 C), temperature source Oral, resp. rate 20, height 5' 2.5" (1.588 m), weight 170 lb 8 oz (77.338 kg). Body mass index is 30.67 kg/(m^2). ECOG PERFORMANCE STATUS: 1 - Symptomatic but completely ambulatory  General: Well developed, well nourished female in no acute distress. Alert and oriented x 3.  Repeat BP of 158/92 with patient reporting decrease in anxiety.  Head/ Neck: Oropharynx clear.  Sclerae anticteric.  Supple without any enlargements.  Lymph node survey: No cervical, supraclavicular, or inguinal adenopathy  Cardiovascular: Regular rate and rhythm. S1 and S2 normal.  Lungs: Clear to auscultation bilaterally. No wheezes/crackles/rhonchi noted.  Skin: No rashes or lesions present. Back: No CVA tenderness  Abdomen: Abdomen soft, non-tender and obese. Active bowel sounds in all quadrants. No evidence of a fluid wave or abdominal masses. Breast:  Scar to the left breast noted with no masses, discharge, or nodularity noted.  Right breast unremarkable with no masses, erythema, discharge, or nodularity noted.  Extremities: No bilateral cyanosis, edema, or clubbing.     LABORATORY DATA: Lab Results  Component Value Date   WBC 8.3 03/12/2012   HGB 12.3 03/12/2012   HCT 37.3 03/12/2012   MCV 87.1 03/12/2012   PLT 235 03/12/2012      Chemistry      Component Value Date/Time   NA 135* 03/12/2012 1310   NA 142 02/24/2011 1233   K 3.7 03/12/2012 1310   K 3.9 02/24/2011 1233   CL 97* 03/12/2012 1310   CL 105 02/24/2011 1233   CO2 27 03/12/2012 1310   CO2 27 02/24/2011 1233   BUN 22.0 03/12/2012 1310   BUN 30* 02/24/2011 1233    CREATININE 1.3* 03/12/2012 1310   CREATININE 1.39* 02/24/2011 1233      Component Value Date/Time   CALCIUM 10.7* 03/12/2012 1310   CALCIUM 9.4 02/24/2011 1233   ALKPHOS 73 03/12/2012 1310   ALKPHOS 68 02/24/2011 1233   AST 16 03/12/2012 1310   AST 17 02/24/2011 1233   ALT 13 03/12/2012 1310   ALT 13 02/24/2011 1233   BILITOT 0.40 03/12/2012 1310   BILITOT 0.4 02/24/2011 1233       RADIOGRAPHIC STUDIES:  No results found.  ASSESSMENT:  #50  67 year old female  with T2 N2 stage IIIA left breast carcinoma s/p left partial mastectomy with axillary lymph node dissection on 05/13/2004.  She underwent 6 q3 week cycles of adjuvant TAC with start date of 06/17/04 (Adriamycin held with the first cycle due to the unavailability of an echo at that time).  She completed radiation in 10/2004 and was started on Arimidex at that time.     #2 Moderate fracture risk, low bone mass, and osteopenia on DXA scan in 12/2010  PLAN:  #1  Clinically patient is doing well her counts are stable.  #2  She is continue taking Fosamax as ordered per Dr. Azucena Cecil.   #3 patient will be seen back in 6 months time for followup  All questions were answered. The patient knows to call the clinic with any problems, questions or concerns. We can certainly see the patient much sooner if necessary.  I spent 20 minutes counseling the patient face to face..  The total time spent in the appointment including abstracting past information was 25  Drue Second, MD Medical/Oncology [facility] [phone] (beeper) [phone] (Office)   11/05/2012, 3:03 PM

## 2012-11-05 NOTE — Telephone Encounter (Signed)
appts made and printed...td 

## 2012-12-31 ENCOUNTER — Ambulatory Visit
Admission: RE | Admit: 2012-12-31 | Discharge: 2012-12-31 | Disposition: A | Discharge status: Home / Self Care | Payer: Self-pay | Source: Ambulatory Visit | Attending: Gynecologic Oncology | Admitting: Gynecologic Oncology

## 2012-12-31 DIAGNOSIS — Z853 Personal history of malignant neoplasm of breast: Secondary | ICD-10-CM

## 2013-02-04 ENCOUNTER — Telehealth: Payer: Self-pay | Admitting: Oncology

## 2013-02-04 ENCOUNTER — Ambulatory Visit (HOSPITAL_BASED_OUTPATIENT_CLINIC_OR_DEPARTMENT_OTHER): Payer: Medicare Other | Admitting: Oncology

## 2013-02-04 ENCOUNTER — Encounter: Payer: Self-pay | Admitting: Oncology

## 2013-02-04 VITALS — BP 165/92 | HR 84 | Temp 98.4°F | Resp 20 | Ht 62.5 in | Wt 165.0 lb

## 2013-02-04 DIAGNOSIS — M899 Disorder of bone, unspecified: Secondary | ICD-10-CM

## 2013-02-04 DIAGNOSIS — Z853 Personal history of malignant neoplasm of breast: Secondary | ICD-10-CM

## 2013-02-04 NOTE — Progress Notes (Signed)
OFFICE PROGRESS NOTE  Sissy Hoff, MD [address] [place] [address]  HPI:  Monica Morrison is a 67 year old female who presented to her primary care physician in February of 2006 because she noted a mass behind her nipple on the left breast.  A mammogram performed on 04/22/2004 compared to a mammogram performed on 08/28/03 revealed a 1.2 cm nodular mass-like density in the anterior left upper outer breast.  There was increase in the anterior left breast with spiculation noted in the craniocaudal margin imaged.  Ultrasound of the breast showed an irregular, angular, hypoechoic solid mass, measuring well 14 mm in height.  Vascularity with lesions and posterior shadowing was also noted along with an enlarged lymph node in the axilla.  After referral to the breast center, a biopsy was performed on 04/27/2004 revealing invasive mammary carcinoma from both the subareolar mass as well as the left axillary mass.  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  S/p 6 q3 week cycles of adjuvant TAC with start date of 06/17/04 (Adriamycin held with the first cycle due to the unavailability of an echo at that time).  She completed radiation in 10/2004 and was started on Arimidex at that time.     CURRENT THERAPY:  Arimidex 1 mg daily since September of 2006.  INTERVAL HISTORY: Monica Morrison 67 y.o. female returns for continued follow up.  She voices no complaints since last visit.  She reports that she is feeling anxious today about her visit because she "hopes everything is ok."  She reports that she is tolerating Arimidex well with no hot flashes or vaginal dryness.    She complains of intermittent joint pain but she relates this to a history of arthritis.  She has been taking Fosamax to address osteopenia noted on a DXA scan in November of 2012 without difficulties.     MEDICAL HISTORY: Past Medical History  Diagnosis Date  . Cancer     breast  . Hypertension   . Diabetes mellitus   . Arthritis   . Breast cancer     ALLERGIES:  has No Known Allergies.  MEDICATIONS:  Current Outpatient Prescriptions  Medication Sig Dispense Refill  . alendronate (FOSAMAX) 70 MG tablet Take 70 mg by mouth every 7 (seven) days. Take with a full glass of water on an empty stomach.       . ALPRAZolam (XANAX) 0.25 MG tablet Take 0.25 mg by mouth at bedtime as needed.      Marland Kitchen anastrozole (ARIMIDEX) 1 MG tablet Take 1 tablet (1 mg total) by mouth daily.  90 tablet  12  . atorvastatin (LIPITOR) 20 MG tablet Take 20 mg by mouth daily.      . Calcium Carbonate (CALCIUM 500 PO) Take by mouth.        Marland Kitchen glipiZIDE (GLUCOTROL) 5 MG tablet Take 10 mg by mouth daily.       Marland Kitchen levothyroxine (SYNTHROID, LEVOTHROID) 112 MCG tablet Take 112 mcg by mouth daily.        . metFORMIN (GLUMETZA) 500 MG (MOD) 24 hr tablet Take 1,000 mg by mouth 2 (two) times daily with a meal.       . METOPROLOL TARTRATE PO Take 50 mg by mouth 2 (two) times daily.       . Multiple Vitamins-Minerals (MULTIVITAMIN PO) Take by mouth.      Marland Kitchen  ONE TOUCH ULTRA TEST test strip       . pioglitazone (ACTOS) 15 MG tablet Take 15 mg by mouth daily.        Marland Kitchen POTASSIUM PO Take by mouth 2 (two) times daily.       . TRIAMTERENE-HCTZ PO Take by mouth daily.       . verapamil (COVERA HS) 240 MG (CO) 24 hr tablet Take 240 mg by mouth at bedtime.         No current facility-administered medications for this visit.    SURGICAL HISTORY:  Past Surgical History  Procedure Laterality Date  . Abdominal hysterectomy    . Breast surgery      REVIEW OF SYSTEMS:   Constitutional: Feels well.  Cardiovascular: No chest pain, shortness of breath, or edema.  Pulmonary: No cough or wheeze.  Gastrointestinal: No nausea, vomiting, or diarrhea. No bright red blood per rectum or change in bowel movement.  Genitourinary: No frequency,  urgency, or dysuria. No vaginal bleeding or discharge.  Musculoskeletal: Intermittent joint pain related to arthritis.  No myalgia. Neurologic: No weakness, numbness, or change in gait.  Psychology: No depression, anxiety, or insomnia  HEALTH MAINTENANCE: Mammogram: 09/2011 Colonoscopy:  Arrange by Dr. Verlee Monte Scan:  12/2010  PHYSICAL EXAMINATION: Blood pressure 165/92, pulse 84, temperature 98.4 F (36.9 C), temperature source Oral, resp. rate 20, height 5' 2.5" (1.588 m), weight 165 lb (74.844 kg). Body mass index is 29.68 kg/(m^2). ECOG PERFORMANCE STATUS: 1 - Symptomatic but completely ambulatory  General: Well developed, well nourished female in no acute distress. Alert and oriented x 3.  Repeat BP of 158/92 with patient reporting decrease in anxiety.  Head/ Neck: Oropharynx clear.  Sclerae anticteric.  Supple without any enlargements.  Lymph node survey: No cervical, supraclavicular, or inguinal adenopathy  Cardiovascular: Regular rate and rhythm. S1 and S2 normal.  Lungs: Clear to auscultation bilaterally. No wheezes/crackles/rhonchi noted.  Skin: No rashes or lesions present. Back: No CVA tenderness  Abdomen: Abdomen soft, non-tender and obese. Active bowel sounds in all quadrants. No evidence of a fluid wave or abdominal masses. Breast:  Scar to the left breast noted with no masses, discharge, or nodularity noted.  Right breast unremarkable with no masses, erythema, discharge, or nodularity noted.  Extremities: No bilateral cyanosis, edema, or clubbing.     LABORATORY DATA: Lab Results  Component Value Date   WBC 8.3 03/12/2012   HGB 12.3 03/12/2012   HCT 37.3 03/12/2012   MCV 87.1 03/12/2012   PLT 235 03/12/2012      Chemistry      Component Value Date/Time   NA 135* 03/12/2012 1310   NA 142 02/24/2011 1233   K 3.7 03/12/2012 1310   K 3.9 02/24/2011 1233   CL 97* 03/12/2012 1310   CL 105 02/24/2011 1233   CO2 27 03/12/2012 1310   CO2 27 02/24/2011 1233   BUN 22.0  03/12/2012 1310   BUN 30* 02/24/2011 1233   CREATININE 1.3* 03/12/2012 1310   CREATININE 1.39* 02/24/2011 1233      Component Value Date/Time   CALCIUM 10.7* 03/12/2012 1310   CALCIUM 9.4 02/24/2011 1233   ALKPHOS 73 03/12/2012 1310   ALKPHOS 68 02/24/2011 1233   AST 16 03/12/2012 1310   AST 17 02/24/2011 1233   ALT 13 03/12/2012 1310   ALT 13 02/24/2011 1233   BILITOT 0.40 03/12/2012 1310   BILITOT 0.4 02/24/2011 1233       RADIOGRAPHIC STUDIES:  No  results found.  ASSESSMENT:  #74  67 year old female with T2 N2 stage IIIA left breast carcinoma s/p left partial mastectomy with axillary lymph node dissection on 05/13/2004.  She underwent 6 q3 week cycles of adjuvant TAC with start date of 06/17/04 (Adriamycin held with the first cycle due to the unavailability of an echo at that time).  She completed radiation in 10/2004 and was started on Arimidex at that time.     #2 Moderate fracture risk, low bone mass, and osteopenia on DXA scan in 12/2010  PLAN:  #1  Clinically patient is doing well her counts are stable.  #2  She is continue taking Fosamax as ordered per Dr. Azucena Cecil.   #3 patient will be seen back in 6 months time for followup  All questions were answered. The patient knows to call the clinic with any problems, questions or concerns. We can certainly see the patient much sooner if necessary.  I spent 20 minutes counseling the patient face to face..  The total time spent in the appointment including abstracting past information was 25  Drue Second, MD Medical/Oncology [facility] [phone] (beeper) [phone] (Office)   02/04/2013, 10:06 AM

## 2013-02-04 NOTE — Telephone Encounter (Signed)
, °

## 2013-07-18 ENCOUNTER — Other Ambulatory Visit: Payer: Self-pay | Admitting: Gastroenterology

## 2013-07-18 DIAGNOSIS — R1310 Dysphagia, unspecified: Secondary | ICD-10-CM

## 2013-07-25 ENCOUNTER — Ambulatory Visit
Admission: RE | Admit: 2013-07-25 | Discharge: 2013-07-25 | Disposition: A | Discharge status: Home / Self Care | Payer: Commercial Managed Care - HMO | Source: Ambulatory Visit | Attending: Gastroenterology | Admitting: Gastroenterology

## 2013-07-25 DIAGNOSIS — R1310 Dysphagia, unspecified: Secondary | ICD-10-CM

## 2013-11-05 ENCOUNTER — Other Ambulatory Visit: Payer: Self-pay

## 2013-11-05 DIAGNOSIS — Z853 Personal history of malignant neoplasm of breast: Secondary | ICD-10-CM

## 2013-11-05 DIAGNOSIS — Z1231 Encounter for screening mammogram for malignant neoplasm of breast: Secondary | ICD-10-CM

## 2013-11-15 ENCOUNTER — Ambulatory Visit
Admission: RE | Admit: 2013-11-15 | Discharge: 2013-11-15 | Disposition: A | Discharge status: Home / Self Care | Payer: Commercial Managed Care - HMO | Source: Ambulatory Visit

## 2013-11-15 DIAGNOSIS — Z853 Personal history of malignant neoplasm of breast: Secondary | ICD-10-CM

## 2013-11-15 DIAGNOSIS — Z1231 Encounter for screening mammogram for malignant neoplasm of breast: Secondary | ICD-10-CM

## 2013-11-16 ENCOUNTER — Telehealth: Payer: Self-pay | Admitting: Hematology and Oncology

## 2013-11-16 NOTE — Telephone Encounter (Signed)
Lft msg for pt regarding labs/ov and MD change, mailed out schedule...Marland KitchenMarland KitchenMarland Kitchen KJ

## 2014-01-15 ENCOUNTER — Telehealth: Payer: Self-pay

## 2014-01-15 NOTE — Telephone Encounter (Signed)
Office notes dtd 01/13/14 rcvd from [facility].  Copy to Dr Lindi Adie.  Original to scan.

## 2014-02-05 ENCOUNTER — Other Ambulatory Visit: Payer: Self-pay

## 2014-02-05 DIAGNOSIS — Z853 Personal history of malignant neoplasm of breast: Secondary | ICD-10-CM

## 2014-02-06 ENCOUNTER — Ambulatory Visit (HOSPITAL_BASED_OUTPATIENT_CLINIC_OR_DEPARTMENT_OTHER): Payer: Commercial Managed Care - HMO | Admitting: Hematology and Oncology

## 2014-02-06 ENCOUNTER — Other Ambulatory Visit (HOSPITAL_BASED_OUTPATIENT_CLINIC_OR_DEPARTMENT_OTHER): Payer: Commercial Managed Care - HMO

## 2014-02-06 ENCOUNTER — Telehealth: Payer: Self-pay | Admitting: Hematology and Oncology

## 2014-02-06 VITALS — BP 160/81 | HR 18 | Temp 97.7°F | Resp 18 | Ht 62.5 in | Wt 180.5 lb

## 2014-02-06 DIAGNOSIS — Z853 Personal history of malignant neoplasm of breast: Secondary | ICD-10-CM

## 2014-02-06 DIAGNOSIS — M899 Disorder of bone, unspecified: Secondary | ICD-10-CM

## 2014-02-06 LAB — COMPREHENSIVE METABOLIC PANEL (CC13)
ALBUMIN: 3.5 g/dL (ref 3.5–5.0)
ALK PHOS: 83 U/L (ref 40–150)
ALT: 12 U/L (ref 0–55)
AST: 15 U/L (ref 5–34)
Anion Gap: 12 mEq/L — ABNORMAL HIGH (ref 3–11)
BUN: 30.1 mg/dL — AB (ref 7.0–26.0)
CHLORIDE: 104 meq/L (ref 98–109)
CO2: 24 mEq/L (ref 22–29)
Calcium: 9.4 mg/dL (ref 8.4–10.4)
Creatinine: 1.5 mg/dL — ABNORMAL HIGH (ref 0.6–1.1)
EGFR: 35 mL/min/{1.73_m2} — ABNORMAL LOW (ref >90)
Glucose: 181 mg/dl — ABNORMAL HIGH (ref 70–140)
POTASSIUM: 4 meq/L (ref 3.5–5.1)
SODIUM: 141 meq/L (ref 136–145)
Total Bilirubin: 0.31 mg/dL (ref 0.20–1.20)
Total Protein: 7.4 g/dL (ref 6.4–8.3)

## 2014-02-06 LAB — CBC WITH DIFFERENTIAL/PLATELET
BASO%: 1.1 % (ref 0.0–2.0)
BASOS ABS: 0.1 10*3/uL (ref 0.0–0.1)
EOS%: 2.8 % (ref 0.0–7.0)
Eosinophils Absolute: 0.2 10*3/uL (ref 0.0–0.5)
HCT: 36.6 % (ref 34.8–46.6)
HGB: 11.2 g/dL — ABNORMAL LOW (ref 11.6–15.9)
LYMPH#: 0.9 10*3/uL (ref 0.9–3.3)
LYMPH%: 11.1 % — ABNORMAL LOW (ref 14.0–49.7)
MCH: 25 pg — AB (ref 25.1–34.0)
MCHC: 30.7 g/dL — AB (ref 31.5–36.0)
MCV: 81.5 fL (ref 79.5–101.0)
MONO#: 0.5 10*3/uL (ref 0.1–0.9)
MONO%: 6.6 % (ref 0.0–14.0)
NEUT#: 6.4 10*3/uL (ref 1.5–6.5)
NEUT%: 78.4 % — ABNORMAL HIGH (ref 38.4–76.8)
Platelets: 250 10*3/uL (ref 145–400)
RBC: 4.49 10*6/uL (ref 3.70–5.45)
RDW: 17.1 % — AB (ref 11.2–14.5)
WBC: 8.2 10*3/uL (ref 3.9–10.3)

## 2014-02-06 NOTE — Assessment & Plan Note (Addendum)
Left breast invasive ductal carcinoma diagnosed 04/27/2004 involving the subareolar mass and the left axillary mass T2 N2 M0 stage IIIa, status post left lumpectomy with axillary lymph node dissection 05/13/2004 followed by adjuvant chemotherapy with TAC followed by radiation therapy completed 10/2004 and Arimidex 1 mg daily since 10/2004 till 10/2009  Arimidex toxicities: Osteopenia: Currently on Fosamax and calcium and vitamin D  Breast cancer surveillance: 1. Breast exam 02/06/2014 normal 2. Mammogram 11/19/2013 normal 3. DEXA scan April 2014: T score -1.3 in the forearm osteopenia  Return to clinic once a year for follow-ups

## 2014-02-06 NOTE — Progress Notes (Signed)
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 S/p 6 q3 week cycles of adjuvant TAC with start date of 06/17/04 (Adriamycin held with the first cycle due to the unavailability of an echo at that time). She completed radiation in 10/2004 and was started on Arimidex at that time.   CURRENT THERAPY: Arimidex 1 mg daily  September of 2006 tilll 10/2009  CHIEF COMPLIANT: Chronic fatigue  INTERVAL HISTORY: Monica Morrison is a 68 year old Caucasian lady with above-mentioned history of stage III left breast cancer who had undergone adjuvant chemotherapy and radiation and had taken Arimidex until 2011. She is currently in observation. Once a year follow-up for breast exams and mammograms review. She reports no new complaints or concerns other than chronic fatigue symptoms.  REVIEW OF SYSTEMS:   Constitutional: Denies fevers, chills or abnormal weight loss Eyes: Denies blurriness of vision Ears, nose, mouth, throat, and face: Denies mucositis or sore throat Respiratory: Denies cough, dyspnea or wheezes Cardiovascular: Denies palpitation, chest discomfort or lower extremity swelling Gastrointestinal:  Denies nausea, heartburn or change in bowel habits Skin: Denies abnormal skin rashes Lymphatics: Denies new lymphadenopathy or easy bruising Neurological:Denies numbness, tingling or new weaknesses Behavioral/Psych: Mood is stable, no new changes  Breast:  denies any pain or lumps or nodules in either breasts All other systems were reviewed with the patient and are negative.  I have reviewed the past medical history, past surgical history, social history and family history with the patient and they are unchanged from previous note.  ALLERGIES:  has No Known Allergies.  MEDICATIONS:  Current Outpatient  Prescriptions  Medication Sig Dispense Refill  . alendronate (FOSAMAX) 70 MG tablet Take 70 mg by mouth every 7 (seven) days. Take with a full glass of water on an empty stomach.     . ALPRAZolam (XANAX) 0.25 MG tablet Take 0.25 mg by mouth at bedtime as needed.    Marland Kitchen atorvastatin (LIPITOR) 20 MG tablet Take 20 mg by mouth daily.    . Calcium Carbonate (CALCIUM 500 PO) Take by mouth.      Marland Kitchen glipiZIDE (GLUCOTROL) 5 MG tablet Take 10 mg by mouth daily.     Marland Kitchen levothyroxine (SYNTHROID, LEVOTHROID) 112 MCG tablet Take 112 mcg by mouth daily.      . metFORMIN (GLUMETZA) 500 MG (MOD) 24 hr tablet Take 1,000 mg by mouth 2 (two) times daily with a meal.     . METOPROLOL TARTRATE PO Take 50 mg by mouth 2 (two) times daily.     . Multiple Vitamins-Minerals (MULTIVITAMIN PO) Take by mouth.    Marland Kitchen omeprazole (PRILOSEC) 20 MG capsule   1  . ONE TOUCH ULTRA TEST test strip     . pioglitazone (ACTOS) 15 MG tablet Take 15 mg by mouth daily.      Marland Kitchen POTASSIUM PO Take by mouth 2 (two) times daily.     . TRIAMTERENE-HCTZ PO Take by mouth daily.     . verapamil (COVERA HS) 240 MG (CO) 24 hr tablet Take 240 mg by mouth at bedtime.       No current facility-administered medications for this visit.    PHYSICAL EXAMINATION: ECOG PERFORMANCE STATUS: 2 - Symptomatic, <50% confined to bed  Filed Vitals:   02/06/14 1050  BP: 160/81  Pulse: 18  Temp: 97.7 F (36.5 C)  Resp: 18   Filed Weights   02/06/14 1050  Weight: 180 lb 8 oz (81.874 kg)    GENERAL:alert, no distress and comfortable SKIN: skin color, texture, turgor are normal, no rashes or significant lesions EYES: normal, Conjunctiva are pink and non-injected, sclera clear OROPHARYNX:no exudate, no erythema and lips, buccal mucosa, and tongue normal  NECK: supple, thyroid normal size, non-tender, without nodularity LYMPH:  no palpable lymphadenopathy in the cervical, axillary or inguinal LUNGS: clear to auscultation and percussion with normal breathing  effort HEART: regular rate & rhythm and no murmurs and no lower extremity edema ABDOMEN:abdomen soft, non-tender and normal bowel sounds Musculoskeletal:no cyanosis of digits and no clubbing ; limitation of extension of the left arm NEURO: alert & oriented x 3 with fluent speech, no focal motor/sensory deficits BREAST: No palpable masses or nodules in right breast. No palpable axillary supraclavicular or infraclavicular adenopathy no breast tenderness or nipple discharge. Left chest wall is without any palpable nodules   LABORATORY DATA:  I have reviewed the data as listed   Chemistry      Component Value Date/Time   NA 141 02/06/2014 0926   NA 142 02/24/2011 1233   K 4.0 02/06/2014 0926   K 3.9 02/24/2011 1233   CL 97* 03/12/2012 1310   CL 105 02/24/2011 1233   CO2 24 02/06/2014 0926   CO2 27 02/24/2011 1233   BUN 30.1* 02/06/2014 0926   BUN 30* 02/24/2011 1233   CREATININE 1.5* 02/06/2014 0926   CREATININE 1.39* 02/24/2011 1233      Component Value Date/Time   CALCIUM 9.4 02/06/2014 0926   CALCIUM 9.4 02/24/2011 1233   ALKPHOS 83 02/06/2014 0926   ALKPHOS 68 02/24/2011 1233   AST 15 02/06/2014 0926   AST 17 02/24/2011 1233   ALT 12 02/06/2014 0926   ALT 13 02/24/2011 1233   BILITOT 0.31 02/06/2014 0926   BILITOT 0.4 02/24/2011 1233       Lab Results  Component Value Date   WBC 8.2 02/06/2014   HGB 11.2* 02/06/2014   HCT 36.6 02/06/2014   MCV 81.5 02/06/2014   PLT 250 02/06/2014   NEUTROABS 6.4 02/06/2014     RADIOGRAPHIC STUDIES: I have personally reviewed the radiology reports and agreed with their findings. Mammogram September 2015 normal  ASSESSMENT & PLAN:  History of breast cancer Left breast invasive ductal carcinoma diagnosed 04/27/2004 involving the subareolar mass and the left axillary mass T2 N2 M0 stage IIIa, status post left lumpectomy with axillary lymph node dissection 05/13/2004 followed by adjuvant chemotherapy with TAC followed by radiation  therapy completed 10/2004 and Arimidex 1 mg daily since 10/2004 till 10/2009  Arimidex toxicities: Osteopenia: Currently on Fosamax and calcium and vitamin D  Breast cancer surveillance: 1. Breast exam 02/06/2014 normal 2. Mammogram 11/19/2013 normal 3. DEXA scan April 2014: T score -1.3 in the forearm osteopenia  Return to clinic once a year for follow-ups  survivorship: I discussed the importance of doing physical activity and exercise. She is planning to join the [facility].  No orders of the defined types were placed in this encounter.   The patient has a good understanding of the overall plan. she agrees with it. She will call with any problems that may develop before her next visit here.   Rulon Eisenmenger, MD 02/06/2014 10:58 AM

## 2014-02-06 NOTE — Telephone Encounter (Signed)
, °

## 2014-11-28 ENCOUNTER — Other Ambulatory Visit: Payer: Self-pay

## 2014-11-28 DIAGNOSIS — Z1231 Encounter for screening mammogram for malignant neoplasm of breast: Secondary | ICD-10-CM

## 2014-12-23 ENCOUNTER — Ambulatory Visit
Admission: RE | Admit: 2014-12-23 | Discharge: 2014-12-23 | Disposition: A | Discharge status: Home / Self Care | Payer: Commercial Managed Care - HMO | Source: Ambulatory Visit

## 2014-12-23 DIAGNOSIS — Z1231 Encounter for screening mammogram for malignant neoplasm of breast: Secondary | ICD-10-CM

## 2015-01-23 ENCOUNTER — Telehealth: Payer: Self-pay | Admitting: Hematology and Oncology

## 2015-01-23 NOTE — Telephone Encounter (Signed)
returned call and s.w. pt and confirmed appt for DEc

## 2015-02-08 NOTE — Assessment & Plan Note (Signed)
Left breast invasive ductal carcinoma diagnosed 04/27/2004 involving the subareolar mass and the left axillary mass T2 N2 M0 stage IIIa, status post left lumpectomy with axillary lymph node dissection 05/13/2004 followed by adjuvant chemotherapy with TAC followed by radiation therapy completed 10/2004 and Arimidex 1 mg daily since 10/2004 till 10/2009  Arimidex toxicities: Osteopenia: Currently on Fosamax and calcium and vitamin D  Breast cancer surveillance: 1. Breast exam 02/08/2015 normal 2. Mammogram 12/24/14 normal 3. DEXA scan April 2014: T score -1.3 in the forearm osteopenia  Return to clinic once a year for follow-ups Survivorship: I discussed the importance of doing physical activity and exercise. She is planning to join the [facility].

## 2015-02-09 ENCOUNTER — Encounter: Payer: Self-pay | Admitting: Hematology and Oncology

## 2015-02-09 ENCOUNTER — Ambulatory Visit (HOSPITAL_BASED_OUTPATIENT_CLINIC_OR_DEPARTMENT_OTHER): Payer: Commercial Managed Care - HMO | Admitting: Hematology and Oncology

## 2015-02-09 ENCOUNTER — Other Ambulatory Visit: Payer: Self-pay | Admitting: *Deleted

## 2015-02-09 ENCOUNTER — Other Ambulatory Visit (HOSPITAL_BASED_OUTPATIENT_CLINIC_OR_DEPARTMENT_OTHER): Payer: Commercial Managed Care - HMO

## 2015-02-09 VITALS — BP 146/71 | HR 74 | Temp 98.0°F | Resp 18 | Ht 62.5 in | Wt 172.8 lb

## 2015-02-09 DIAGNOSIS — C50512 Malignant neoplasm of lower-outer quadrant of left female breast: Secondary | ICD-10-CM

## 2015-02-09 DIAGNOSIS — Z853 Personal history of malignant neoplasm of breast: Secondary | ICD-10-CM | POA: Diagnosis not present

## 2015-02-09 LAB — COMPREHENSIVE METABOLIC PANEL
ALT: 13 U/L (ref 0–55)
AST: 17 U/L (ref 5–34)
Albumin: 3.5 g/dL (ref 3.5–5.0)
Alkaline Phosphatase: 74 U/L (ref 40–150)
Anion Gap: 10 mEq/L (ref 3–11)
BUN: 30.1 mg/dL — ABNORMAL HIGH (ref 7.0–26.0)
CHLORIDE: 104 meq/L (ref 98–109)
CO2: 27 meq/L (ref 22–29)
Calcium: 10.3 mg/dL (ref 8.4–10.4)
Creatinine: 1.4 mg/dL — ABNORMAL HIGH (ref 0.6–1.1)
EGFR: 38 mL/min/{1.73_m2} — AB (ref >90)
GLUCOSE: 127 mg/dL (ref 70–140)
Potassium: 4.5 mEq/L (ref 3.5–5.1)
SODIUM: 141 meq/L (ref 136–145)
TOTAL PROTEIN: 7.4 g/dL (ref 6.4–8.3)
Total Bilirubin: 0.35 mg/dL (ref 0.20–1.20)

## 2015-02-09 LAB — CBC WITH DIFFERENTIAL/PLATELET
BASO%: 1.1 % (ref 0.0–2.0)
Basophils Absolute: 0.1 10*3/uL (ref 0.0–0.1)
EOS%: 2.7 % (ref 0.0–7.0)
Eosinophils Absolute: 0.2 10*3/uL (ref 0.0–0.5)
HCT: 37.7 % (ref 34.8–46.6)
HGB: 11.7 g/dL (ref 11.6–15.9)
LYMPH%: 11.6 % — ABNORMAL LOW (ref 14.0–49.7)
MCH: 24.9 pg — ABNORMAL LOW (ref 25.1–34.0)
MCHC: 30.9 g/dL — ABNORMAL LOW (ref 31.5–36.0)
MCV: 80.4 fL (ref 79.5–101.0)
MONO#: 0.6 10*3/uL (ref 0.1–0.9)
MONO%: 7.5 % (ref 0.0–14.0)
NEUT#: 6.6 10*3/uL — ABNORMAL HIGH (ref 1.5–6.5)
NEUT%: 77.1 % — ABNORMAL HIGH (ref 38.4–76.8)
Platelets: 249 10*3/uL (ref 145–400)
RBC: 4.69 10*6/uL (ref 3.70–5.45)
RDW: 18.3 % — ABNORMAL HIGH (ref 11.2–14.5)
WBC: 8.6 10*3/uL (ref 3.9–10.3)
lymph#: 1 10*3/uL (ref 0.9–3.3)

## 2015-02-09 NOTE — Progress Notes (Signed)
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 S/p 6 q3 week cycles of adjuvant TAC with start date of 06/17/04 (Adriamycin held with the first cycle due to the unavailability of an echo at that time). She completed radiation in 10/2004 and was started on Arimidex at that time.   CURRENT THERAPY: Arimidex 1 mg daily September of 2006 tilll 10/2009 INTERVAL HISTORY: Monica Morrison is a  69 year old with above-mentioned history also left breast cancer was currently on surveillance. She is been 10 years since diagnosis. Denies any lumps or nodules in the breast. She spends most of her time helping others. Her sister was diagnosed with breast cancer.  REVIEW OF SYSTEMS:   Constitutional: Denies fevers, chills or abnormal weight loss Eyes: Denies blurriness of vision Ears, nose, mouth, throat, and face: Denies mucositis or sore throat Respiratory: Denies cough, dyspnea or wheezes Cardiovascular: Denies palpitation, chest discomfort or lower extremity swelling Gastrointestinal:  Denies nausea, heartburn or change in bowel habits Skin: Denies abnormal skin rashes Lymphatics: Denies new lymphadenopathy or easy bruising Neurological:Denies numbness, tingling or new weaknesses Behavioral/Psych: Mood is stable, no new changes  Breast:  denies any pain or lumps or nodules in either breasts All other systems were reviewed with the patient and are negative.  I have reviewed the past medical history, past surgical history, social history and family history with the patient and they are unchanged from previous note.  ALLERGIES:  has No Known Allergies.  MEDICATIONS:  Current Outpatient Prescriptions  Medication Sig Dispense Refill  . alendronate (FOSAMAX) 70 MG tablet Take 70 mg by mouth every 7 (seven) days.  Take with a full glass of water on an empty stomach.     . ALPRAZolam (XANAX) 0.25 MG tablet Take 0.25 mg by mouth at bedtime as needed.    Marland Kitchen atorvastatin (LIPITOR) 20 MG tablet Take 20 mg by mouth daily.    . Calcium Carbonate (CALCIUM 500 PO) Take by mouth.      . escitalopram (LEXAPRO) 10 MG tablet     . glipiZIDE (GLUCOTROL) 5 MG tablet Take 10 mg by mouth daily.     Marland Kitchen levothyroxine (SYNTHROID, LEVOTHROID) 112 MCG tablet Take 112 mcg by mouth daily.      . metFORMIN (GLUMETZA) 500 MG (MOD) 24 hr tablet Take 1,000 mg by mouth 2 (two) times daily with a meal.     . METOPROLOL TARTRATE PO Take 50 mg by mouth 2 (two) times daily.     . Multiple Vitamins-Minerals (MULTIVITAMIN PO) Take by mouth.    Marland Kitchen omeprazole (PRILOSEC) 20 MG capsule   1  . ONE TOUCH ULTRA TEST test strip     . pioglitazone (ACTOS) 15 MG tablet Take 15 mg by mouth daily.      Marland Kitchen POTASSIUM PO Take 8 mEq by mouth 2 (two) times daily.     . TRIAMTERENE-HCTZ PO Take by mouth daily.     . verapamil (COVERA HS) 240 MG (CO) 24 hr tablet Take 240 mg by mouth at bedtime.       No current facility-administered medications for this visit.    PHYSICAL EXAMINATION: ECOG PERFORMANCE STATUS: 0 - Asymptomatic  Filed Vitals:   02/09/15 1126  BP: 146/71  Pulse: 74  Temp: 98 F (36.7 C)  Resp: 18   Filed Weights  02/09/15 1126  Weight: 172 lb 12.8 oz (78.382 kg)    GENERAL:alert, no distress and comfortable SKIN: skin color, texture, turgor are normal, no rashes or significant lesions EYES: normal, Conjunctiva are pink and non-injected, sclera clear OROPHARYNX:no exudate, no erythema and lips, buccal mucosa, and tongue normal  NECK: supple, thyroid normal size, non-tender, without nodularity LYMPH:  no palpable lymphadenopathy in the cervical, axillary or inguinal LUNGS: clear to auscultation and percussion with normal breathing effort HEART: regular rate & rhythm and no murmurs and no lower extremity edema ABDOMEN:abdomen  soft, non-tender and normal bowel sounds Musculoskeletal:no cyanosis of digits and no clubbing  NEURO: alert & oriented x 3 with fluent speech, no focal motor/sensory deficits BREAST: No palpable masses or nodules in either right or left breasts. No palpable axillary supraclavicular or infraclavicular adenopathy no breast tenderness or nipple discharge. (exam performed in the presence of a chaperone)  LABORATORY DATA:  I have reviewed the data as listed   Chemistry      Component Value Date/Time   NA 141 02/06/2014 0926   NA 142 02/24/2011 1233   K 4.0 02/06/2014 0926   K 3.9 02/24/2011 1233   CL 97* 03/12/2012 1310   CL 105 02/24/2011 1233   CO2 24 02/06/2014 0926   CO2 27 02/24/2011 1233   BUN 30.1* 02/06/2014 0926   BUN 30* 02/24/2011 1233   CREATININE 1.5* 02/06/2014 0926   CREATININE 1.39* 02/24/2011 1233      Component Value Date/Time   CALCIUM 9.4 02/06/2014 0926   CALCIUM 9.4 02/24/2011 1233   ALKPHOS 83 02/06/2014 0926   ALKPHOS 68 02/24/2011 1233   AST 15 02/06/2014 0926   AST 17 02/24/2011 1233   ALT 12 02/06/2014 0926   ALT 13 02/24/2011 1233   BILITOT 0.31 02/06/2014 0926   BILITOT 0.4 02/24/2011 1233       Lab Results  Component Value Date   WBC 8.6 02/09/2015   HGB 11.7 02/09/2015   HCT 37.7 02/09/2015   MCV 80.4 02/09/2015   PLT 249 02/09/2015   NEUTROABS 6.6* 02/09/2015   ASSESSMENT & PLAN:  History of breast cancer Left breast invasive ductal carcinoma diagnosed 04/27/2004 involving the subareolar mass and the left axillary mass T2 N2 M0 stage IIIa, status post left lumpectomy with axillary lymph node dissection 05/13/2004 followed by adjuvant chemotherapy with TAC followed by radiation therapy completed 10/2004 and Arimidex 1 mg daily since 10/2004 till 10/2009  Arimidex toxicities: Osteopenia: Currently on Fosamax and calcium and vitamin D  Breast cancer surveillance: 1. Breast exam 02/08/2015 normal 2. Mammogram 12/24/14 normal 3. DEXA scan  April 2014: T score -1.3 in the forearm osteopenia  Patient wishes to follow-up with primary care physician from next year onwards. I believe that would be completely reasonable since she is your of breast cancer and it has been over 10 years from diagnosis.   return to clinic on an as-needed basis. No orders of the defined types were placed in this encounter.   The patient has a good understanding of the overall plan. she agrees with it. she will call with any problems that may develop before the next visit here.   Rulon Eisenmenger, MD 02/09/2015

## 2015-06-15 ENCOUNTER — Encounter (HOSPITAL_BASED_OUTPATIENT_CLINIC_OR_DEPARTMENT_OTHER): Payer: Self-pay | Admitting: *Deleted

## 2015-06-15 ENCOUNTER — Emergency Department (HOSPITAL_BASED_OUTPATIENT_CLINIC_OR_DEPARTMENT_OTHER): Payer: Commercial Managed Care - HMO

## 2015-06-15 ENCOUNTER — Inpatient Hospital Stay (HOSPITAL_BASED_OUTPATIENT_CLINIC_OR_DEPARTMENT_OTHER)
Admission: EM | Admit: 2015-06-15 | Discharge: 2015-06-22 | DRG: 389 | Disposition: A | Discharge status: Home / Self Care | Payer: Commercial Managed Care - HMO | Attending: Surgery | Admitting: Surgery

## 2015-06-15 ENCOUNTER — Inpatient Hospital Stay (HOSPITAL_COMMUNITY): Payer: Commercial Managed Care - HMO

## 2015-06-15 DIAGNOSIS — E1122 Type 2 diabetes mellitus with diabetic chronic kidney disease: Secondary | ICD-10-CM | POA: Diagnosis present

## 2015-06-15 DIAGNOSIS — K8020 Calculus of gallbladder without cholecystitis without obstruction: Secondary | ICD-10-CM | POA: Diagnosis present

## 2015-06-15 DIAGNOSIS — I440 Atrioventricular block, first degree: Secondary | ICD-10-CM | POA: Diagnosis present

## 2015-06-15 DIAGNOSIS — K56609 Unspecified intestinal obstruction, unspecified as to partial versus complete obstruction: Secondary | ICD-10-CM

## 2015-06-15 DIAGNOSIS — M1990 Unspecified osteoarthritis, unspecified site: Secondary | ICD-10-CM | POA: Diagnosis present

## 2015-06-15 DIAGNOSIS — Z9221 Personal history of antineoplastic chemotherapy: Secondary | ICD-10-CM | POA: Diagnosis not present

## 2015-06-15 DIAGNOSIS — E860 Dehydration: Secondary | ICD-10-CM | POA: Diagnosis present

## 2015-06-15 DIAGNOSIS — Z7984 Long term (current) use of oral hypoglycemic drugs: Secondary | ICD-10-CM

## 2015-06-15 DIAGNOSIS — K565 Intestinal adhesions [bands] with obstruction (postprocedural) (postinfection): Secondary | ICD-10-CM | POA: Diagnosis present

## 2015-06-15 DIAGNOSIS — E039 Hypothyroidism, unspecified: Secondary | ICD-10-CM | POA: Diagnosis present

## 2015-06-15 DIAGNOSIS — K769 Liver disease, unspecified: Secondary | ICD-10-CM | POA: Diagnosis present

## 2015-06-15 DIAGNOSIS — K5669 Other intestinal obstruction: Secondary | ICD-10-CM | POA: Diagnosis not present

## 2015-06-15 DIAGNOSIS — K5730 Diverticulosis of large intestine without perforation or abscess without bleeding: Secondary | ICD-10-CM | POA: Diagnosis present

## 2015-06-15 DIAGNOSIS — Z833 Family history of diabetes mellitus: Secondary | ICD-10-CM

## 2015-06-15 DIAGNOSIS — N183 Chronic kidney disease, stage 3 unspecified: Secondary | ICD-10-CM | POA: Diagnosis present

## 2015-06-15 DIAGNOSIS — I129 Hypertensive chronic kidney disease with stage 1 through stage 4 chronic kidney disease, or unspecified chronic kidney disease: Secondary | ICD-10-CM | POA: Diagnosis present

## 2015-06-15 DIAGNOSIS — Z853 Personal history of malignant neoplasm of breast: Secondary | ICD-10-CM | POA: Diagnosis not present

## 2015-06-15 DIAGNOSIS — E118 Type 2 diabetes mellitus with unspecified complications: Secondary | ICD-10-CM | POA: Diagnosis not present

## 2015-06-15 DIAGNOSIS — Z9010 Acquired absence of unspecified breast and nipple: Secondary | ICD-10-CM

## 2015-06-15 DIAGNOSIS — M810 Age-related osteoporosis without current pathological fracture: Secondary | ICD-10-CM | POA: Diagnosis present

## 2015-06-15 DIAGNOSIS — R945 Abnormal results of liver function studies: Secondary | ICD-10-CM

## 2015-06-15 DIAGNOSIS — Z8249 Family history of ischemic heart disease and other diseases of the circulatory system: Secondary | ICD-10-CM | POA: Diagnosis not present

## 2015-06-15 DIAGNOSIS — K2960 Other gastritis without bleeding: Secondary | ICD-10-CM | POA: Diagnosis present

## 2015-06-15 DIAGNOSIS — F410 Panic disorder [episodic paroxysmal anxiety] without agoraphobia: Secondary | ICD-10-CM | POA: Diagnosis present

## 2015-06-15 DIAGNOSIS — Z923 Personal history of irradiation: Secondary | ICD-10-CM | POA: Diagnosis not present

## 2015-06-15 DIAGNOSIS — R7989 Other specified abnormal findings of blood chemistry: Secondary | ICD-10-CM | POA: Diagnosis present

## 2015-06-15 DIAGNOSIS — A045 Campylobacter enteritis: Secondary | ICD-10-CM | POA: Diagnosis present

## 2015-06-15 DIAGNOSIS — Z79899 Other long term (current) drug therapy: Secondary | ICD-10-CM | POA: Diagnosis not present

## 2015-06-15 DIAGNOSIS — E279 Disorder of adrenal gland, unspecified: Secondary | ICD-10-CM | POA: Diagnosis present

## 2015-06-15 DIAGNOSIS — N179 Acute kidney failure, unspecified: Secondary | ICD-10-CM | POA: Diagnosis present

## 2015-06-15 DIAGNOSIS — Z7983 Long term (current) use of bisphosphonates: Secondary | ICD-10-CM | POA: Diagnosis not present

## 2015-06-15 DIAGNOSIS — C50919 Malignant neoplasm of unspecified site of unspecified female breast: Secondary | ICD-10-CM | POA: Diagnosis present

## 2015-06-15 DIAGNOSIS — E876 Hypokalemia: Secondary | ICD-10-CM | POA: Diagnosis present

## 2015-06-15 DIAGNOSIS — N189 Chronic kidney disease, unspecified: Secondary | ICD-10-CM

## 2015-06-15 HISTORY — DX: Hypothyroidism, unspecified: E03.9

## 2015-06-15 HISTORY — DX: Chronic kidney disease, unspecified: N18.9

## 2015-06-15 LAB — URINE MICROSCOPIC-ADD ON

## 2015-06-15 LAB — URINALYSIS, ROUTINE W REFLEX MICROSCOPIC
GLUCOSE, UA: NEGATIVE mg/dL
HGB URINE DIPSTICK: NEGATIVE
Ketones, ur: 15 mg/dL — AB
Nitrite: NEGATIVE
PH: 6 (ref 5.0–8.0)
Protein, ur: 30 mg/dL — AB
SPECIFIC GRAVITY, URINE: 1.027 (ref 1.005–1.030)

## 2015-06-15 LAB — CBC
HEMATOCRIT: 41.3 % (ref 36.0–46.0)
HEMOGLOBIN: 13 g/dL (ref 12.0–15.0)
MCH: 26.1 pg (ref 26.0–34.0)
MCHC: 31.5 g/dL (ref 30.0–36.0)
MCV: 82.9 fL (ref 78.0–100.0)
Platelets: 219 10*3/uL (ref 150–400)
RBC: 4.98 MIL/uL (ref 3.87–5.11)
RDW: 17.5 % — ABNORMAL HIGH (ref 11.5–15.5)
WBC: 11.4 10*3/uL — AB (ref 4.0–10.5)

## 2015-06-15 LAB — CBC WITH DIFFERENTIAL/PLATELET
Basophils Absolute: 0 10*3/uL (ref 0.0–0.1)
Basophils Relative: 0 %
EOS PCT: 0 %
Eosinophils Absolute: 0 10*3/uL (ref 0.0–0.7)
HEMATOCRIT: 44.4 % (ref 36.0–46.0)
Hemoglobin: 14.5 g/dL (ref 12.0–15.0)
LYMPHS ABS: 0.8 10*3/uL (ref 0.7–4.0)
LYMPHS PCT: 7 %
MCH: 26.4 pg (ref 26.0–34.0)
MCHC: 32.7 g/dL (ref 30.0–36.0)
MCV: 80.9 fL (ref 78.0–100.0)
MONO ABS: 0.7 10*3/uL (ref 0.1–1.0)
MONOS PCT: 7 %
Neutro Abs: 9.3 10*3/uL — ABNORMAL HIGH (ref 1.7–7.7)
Neutrophils Relative %: 86 %
PLATELETS: 282 10*3/uL (ref 150–400)
RBC: 5.49 MIL/uL — ABNORMAL HIGH (ref 3.87–5.11)
RDW: 19 % — AB (ref 11.5–15.5)
WBC: 10.9 10*3/uL — ABNORMAL HIGH (ref 4.0–10.5)

## 2015-06-15 LAB — COMPREHENSIVE METABOLIC PANEL
ALT: 30 U/L (ref 14–54)
AST: 26 U/L (ref 15–41)
Albumin: 4.2 g/dL (ref 3.5–5.0)
Alkaline Phosphatase: 162 U/L — ABNORMAL HIGH (ref 38–126)
Anion gap: 23 — ABNORMAL HIGH (ref 5–15)
BILIRUBIN TOTAL: 0.8 mg/dL (ref 0.3–1.2)
BUN: 35 mg/dL — ABNORMAL HIGH (ref 6–20)
CHLORIDE: 94 mmol/L — AB (ref 101–111)
CO2: 22 mmol/L (ref 22–32)
CREATININE: 1.57 mg/dL — AB (ref 0.44–1.00)
Calcium: 10.2 mg/dL (ref 8.9–10.3)
GFR, EST AFRICAN AMERICAN: 37 mL/min — AB (ref >60)
GFR, EST NON AFRICAN AMERICAN: 32 mL/min — AB (ref >60)
Glucose, Bld: 213 mg/dL — ABNORMAL HIGH (ref 65–99)
POTASSIUM: 3.6 mmol/L (ref 3.5–5.1)
Sodium: 139 mmol/L (ref 135–145)
TOTAL PROTEIN: 7.9 g/dL (ref 6.5–8.1)

## 2015-06-15 LAB — GLUCOSE, CAPILLARY: Glucose-Capillary: 95 mg/dL (ref 65–99)

## 2015-06-15 LAB — CREATININE, SERUM
CREATININE: 1.46 mg/dL — AB (ref 0.44–1.00)
GFR, EST AFRICAN AMERICAN: 41 mL/min — AB (ref >60)
GFR, EST NON AFRICAN AMERICAN: 35 mL/min — AB (ref >60)

## 2015-06-15 LAB — CBG MONITORING, ED
GLUCOSE-CAPILLARY: 89 mg/dL (ref 65–99)
Glucose-Capillary: 186 mg/dL — ABNORMAL HIGH (ref 65–99)

## 2015-06-15 LAB — LIPASE, BLOOD: LIPASE: 20 U/L (ref 11–51)

## 2015-06-15 MED ORDER — FAMOTIDINE IN NACL 20-0.9 MG/50ML-% IV SOLN
20.0000 mg | Freq: Two times a day (BID) | INTRAVENOUS | Status: DC
  Administered 2015-06-15 – 2015-06-17 (×4): 20 mg via INTRAVENOUS
  Filled 2015-06-15 (×5): qty 50

## 2015-06-15 MED ORDER — FAMOTIDINE 20 MG PO TABS
20.0000 mg | ORAL_TABLET | Freq: Once | ORAL | Status: AC
  Administered 2015-06-15: 20 mg via ORAL
  Filled 2015-06-15: qty 1

## 2015-06-15 MED ORDER — HEPARIN SODIUM (PORCINE) 5000 UNIT/ML IJ SOLN
5000.0000 [IU] | Freq: Three times a day (TID) | INTRAMUSCULAR | Status: DC
  Administered 2015-06-15 – 2015-06-22 (×20): 5000 [IU] via SUBCUTANEOUS
  Filled 2015-06-15 (×20): qty 1

## 2015-06-15 MED ORDER — ACETAMINOPHEN 325 MG PO TABS: 650.0000 mg | ORAL_TABLET | Freq: Four times a day (QID) | ORAL | Status: DC | PRN

## 2015-06-15 MED ORDER — INSULIN ASPART 100 UNIT/ML ~~LOC~~ SOLN
1.0000 [IU] | SUBCUTANEOUS | Status: DC
  Administered 2015-06-16 (×2): 2 [IU] via SUBCUTANEOUS
  Administered 2015-06-17 (×3): 1 [IU] via SUBCUTANEOUS
  Administered 2015-06-17 – 2015-06-18 (×3): 2 [IU] via SUBCUTANEOUS
  Administered 2015-06-18 (×3): 1 [IU] via SUBCUTANEOUS
  Administered 2015-06-18: 2 [IU] via SUBCUTANEOUS
  Administered 2015-06-19 (×2): 3 [IU] via SUBCUTANEOUS

## 2015-06-15 MED ORDER — IOPAMIDOL (ISOVUE-300) INJECTION 61%
80.0000 mL | Freq: Once | INTRAVENOUS | Status: AC | PRN
  Administered 2015-06-15: 80 mL via INTRAVENOUS

## 2015-06-15 MED ORDER — DIPHENHYDRAMINE HCL 12.5 MG/5ML PO ELIX: 12.5000 mg | ORAL_SOLUTION | Freq: Four times a day (QID) | ORAL | Status: DC | PRN

## 2015-06-15 MED ORDER — SODIUM CHLORIDE 0.9 % IV SOLN: INTRAVENOUS | Status: DC

## 2015-06-15 MED ORDER — ACETAMINOPHEN 650 MG RE SUPP: 650.0000 mg | Freq: Four times a day (QID) | RECTAL | Status: DC | PRN

## 2015-06-15 MED ORDER — ONDANSETRON 4 MG PO TBDP: 4.0000 mg | ORAL_TABLET | Freq: Four times a day (QID) | ORAL | Status: DC | PRN

## 2015-06-15 MED ORDER — ONDANSETRON HCL 4 MG/2ML IJ SOLN: 4.0000 mg | Freq: Once | INTRAMUSCULAR | Status: DC

## 2015-06-15 MED ORDER — LEVOTHYROXINE SODIUM 100 MCG IV SOLR
56.0000 ug | Freq: Every day | INTRAVENOUS | Status: DC
  Administered 2015-06-15 – 2015-06-18 (×4): 56 ug via INTRAVENOUS
  Filled 2015-06-15 (×4): qty 5

## 2015-06-15 MED ORDER — HYDROMORPHONE HCL 1 MG/ML IJ SOLN
0.5000 mg | INTRAMUSCULAR | Status: DC | PRN
Start: 2015-06-15 — End: 2015-06-22
  Filled 2015-06-15: qty 1

## 2015-06-15 MED ORDER — ONDANSETRON 8 MG PO TBDP
8.0000 mg | ORAL_TABLET | Freq: Once | ORAL | Status: AC
  Administered 2015-06-15: 8 mg via ORAL
  Filled 2015-06-15: qty 1

## 2015-06-15 MED ORDER — METOPROLOL TARTRATE 1 MG/ML IV SOLN
2.5000 mg | Freq: Four times a day (QID) | INTRAVENOUS | Status: DC
  Administered 2015-06-15 – 2015-06-16 (×4): 2.5 mg via INTRAVENOUS
  Filled 2015-06-15 (×6): qty 5

## 2015-06-15 MED ORDER — PIPERACILLIN-TAZOBACTAM 3.375 G IVPB
3.3750 g | Freq: Three times a day (TID) | INTRAVENOUS | Status: DC
  Administered 2015-06-15 – 2015-06-17 (×6): 3.375 g via INTRAVENOUS
  Filled 2015-06-15 (×8): qty 50

## 2015-06-15 MED ORDER — SODIUM CHLORIDE 0.9 % IV BOLUS (SEPSIS)
1000.0000 mL | Freq: Once | INTRAVENOUS | Status: AC
  Administered 2015-06-15: 1000 mL via INTRAVENOUS

## 2015-06-15 MED ORDER — ONDANSETRON HCL 4 MG/2ML IJ SOLN
4.0000 mg | Freq: Four times a day (QID) | INTRAMUSCULAR | Status: DC | PRN
  Administered 2015-06-19: 4 mg via INTRAVENOUS
  Filled 2015-06-15: qty 2

## 2015-06-15 MED ORDER — DIPHENHYDRAMINE HCL 50 MG/ML IJ SOLN: 12.5000 mg | Freq: Four times a day (QID) | INTRAMUSCULAR | Status: DC | PRN

## 2015-06-15 MED ORDER — SODIUM CHLORIDE 0.9 % IV SOLN
INTRAVENOUS | Status: DC
  Administered 2015-06-15: 100 mL/h via INTRAVENOUS

## 2015-06-15 NOTE — Consult Note (Signed)
Reason for Consult:  Nausea and vomiting Referring Physician: Anica Alcaraz is an 70 y.o. female.  HPI: 70 y/o who presented to [facility] with nausea and vomiting. Symptoms started on Thursday 06/11/15.  She was OK on Friday, but sx of nausea and vomiting started again on 06/12/15.  She has not been able to keep any thing down since then.  She presented to ED at [facility] this AM.  She notes no BM she can remember for 2-3 days, but now is having significant diarrhea here in the ED at [facility].  No fever, minimal abdominal pain most lying down over the last 48 hours.   Work up shows she is afebrile, BP up some on admit. Her creatinine is 1.57, glucose is 213.  Alk phos is up other LFT's are normal.  WVC 10.9.  UA shows 6-30 WBC, but is nitrite negative.  EKG shows SR, with old anterior infarct, LAF.   CT shows:  1. Findings consistent with small-bowel obstruction likely due to adhesions near the junction of the jejunum and ileum  2. Cholelithiasis without secondary sign of cholecystitis by CT scan 3. New masses in the right hepatic lobe and in the bilateral adrenal glands. These could represent metastatic lesions in further evaluation with MRI is recommended. 4. There is diverticulosis involving the descending colon and sigmoid colon. There is wall thickening involving the distal half of the sigmoid colon and more severely, the rectum. Findings suggest distal colitis possibly of infectious origin. 5. Subtle hyperattenuation left adnexal region as discussed above likely representing prominent pelvic veins in the area, but exact etiology and potential relationship to inflamed distal colon uncertain and deserving follow up CT imaging to reassess. We are ask to see.    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 Abdominal hysterectomy    . Breast surgery      Family History  Problem Relation Age of Onset  . Diabetes Mother   . Hypertension Mother   . Diabetes Sister   . Hypertension Sister     Social History:  reports that she has never smoked. She does not have any smokeless tobacco history on file. She reports that she does not drink alcohol or use illicit drugs. Tobacco:  Never ETOH:  None  Drugs:  None  Retired from [employer]    Allergies: No Known Allergies  Prior to Admission medications   Medication Sig Start Date End Date Taking? Authorizing Provider  alendronate (FOSAMAX) 70 MG tablet Take 70 mg by mouth every 7 (seven) days. Take with a full glass of water on an empty stomach.    Yes Historical Provider, MD  ALPRAZolam Duanne Moron) 0.25 MG tablet Take 0.25 mg by mouth at bedtime as needed.   Yes Historical Provider, MD  atorvastatin (LIPITOR) 20 MG tablet Take 20 mg by mouth daily.   Yes Historical Provider, MD  Calcium Carbonate (CALCIUM 500 PO) Take by mouth.     Yes Historical Provider, MD  escitalopram (LEXAPRO) 10 MG tablet  01/06/15  Yes Historical Provider, MD  glipiZIDE (GLUCOTROL) 5 MG tablet Take 10 mg by mouth daily.    Yes Historical Provider, MD  levothyroxine (SYNTHROID, LEVOTHROID) 112 MCG tablet Take 112 mcg by mouth daily.     Yes Historical Provider, MD  metFORMIN (GLUMETZA) 500 MG (MOD) 24 hr tablet Take 1,000 mg by mouth 2 (two) times daily with a meal.    Yes Historical Provider, MD  metoprolol tartrate (LOPRESSOR) 25 MG tablet Take 50 mg by mouth 2 (two) times daily.    Yes Historical Provider, MD  METOPROLOL TARTRATE PO Take 50 mg by mouth 2 (two) times daily.    Yes Historical Provider, MD  Multiple Vitamins-Minerals (MULTIVITAMIN PO) Take by mouth.   Yes Historical  Provider, MD  omeprazole (PRILOSEC) 20 MG capsule  01/21/14  Yes Historical Provider, MD  ONE TOUCH ULTRA TEST test strip  01/03/13  Yes Historical Provider, MD  pioglitazone (ACTOS) 15 MG tablet Take 15 mg by mouth daily.     Yes Historical Provider, MD  POTASSIUM PO Take 8 mEq by mouth 2 (two) times daily.    Yes Historical Provider, MD  triamterene-hydrochlorothiazide (MAXZIDE-25) 37.5-25 MG tablet Take 1 tablet by mouth daily. 04/07/15  Yes Historical Provider, MD  verapamil (COVERA HS) 240 MG (CO) 24 hr tablet Take 240 mg by mouth at bedtime.     Yes Historical Provider, MD  TRIAMTERENE-HCTZ PO Take by mouth daily.     Historical Provider, MD    . sodium chloride 100 mL/hr (06/15/15 1255)   PRN: Anti-infectives    None      Results for orders placed or performed during the hospital encounter of 06/15/15 (from the past 48 hour(s))  Urinalysis, Routine w reflex microscopic (not at [facility])     Status: Abnormal   Collection Time: 06/15/15  6:03 AM  Result Value Ref Range   Color, Urine YELLOW YELLOW   APPearance CLOUDY (A) CLEAR   Specific Gravity, Urine 1.027 1.005 - 1.030   pH 6.0 5.0 - 8.0   Glucose, UA NEGATIVE NEGATIVE mg/dL   Hgb urine dipstick NEGATIVE NEGATIVE   Bilirubin Urine MODERATE (A) NEGATIVE   Ketones, ur 15 (A) NEGATIVE mg/dL   Protein, ur 30 (A) NEGATIVE mg/dL   Nitrite NEGATIVE NEGATIVE   Leukocytes, UA MODERATE (A) NEGATIVE  Urine microscopic-add on     Status: Abnormal   Collection Time: 06/15/15  6:03 AM  Result Value Ref Range   Squamous Epithelial / LPF 0-5 (A) NONE SEEN   WBC, UA 6-30 0 - 5 WBC/hpf   RBC / HPF 6-30 0 - 5 RBC/hpf   Bacteria, UA FEW (A) NONE SEEN  CBG monitoring, ED     Status: Abnormal   Collection Time: 06/15/15  6:05 AM  Result Value Ref Range   Glucose-Capillary 186 (H) 65 - 99 mg/dL  CBC with Differential/Platelet     Status: Abnormal   Collection Time: 06/15/15  6:38 AM  Result Value Ref Range   WBC 10.9 (H) 4.0 - 10.5 K/uL    RBC 5.49 (H) 3.87 - 5.11 MIL/uL   Hemoglobin 14.5 12.0 - 15.0 g/dL   HCT 44.4 36.0 - 46.0 %   MCV 80.9 78.0 - 100.0 fL   MCH 26.4 26.0 - 34.0 pg   MCHC 32.7 30.0 - 36.0 g/dL   RDW 19.0 (H) 11.5 - 15.5 %   Platelets 282 150 - 400 K/uL   Neutrophils Relative % 86 %  Neutro Abs 9.3 (H) 1.7 - 7.7 K/uL   Lymphocytes Relative 7 %   Lymphs Abs 0.8 0.7 - 4.0 K/uL   Monocytes Relative 7 %   Monocytes Absolute 0.7 0.1 - 1.0 K/uL   Eosinophils Relative 0 %   Eosinophils Absolute 0.0 0.0 - 0.7 K/uL   Basophils Relative 0 %   Basophils Absolute 0.0 0.0 - 0.1 K/uL  Comprehensive metabolic panel     Status: Abnormal   Collection Time: 06/15/15  6:38 AM  Result Value Ref Range   Sodium 139 135 - 145 mmol/L   Potassium 3.6 3.5 - 5.1 mmol/L   Chloride 94 (L) 101 - 111 mmol/L   CO2 22 22 - 32 mmol/L   Glucose, Bld 213 (H) 65 - 99 mg/dL   BUN 35 (H) 6 - 20 mg/dL   Creatinine, Ser 1.57 (H) 0.44 - 1.00 mg/dL   Calcium 10.2 8.9 - 10.3 mg/dL   Total Protein 7.9 6.5 - 8.1 g/dL   Albumin 4.2 3.5 - 5.0 g/dL   AST 26 15 - 41 U/L   ALT 30 14 - 54 U/L   Alkaline Phosphatase 162 (H) 38 - 126 U/L   Total Bilirubin 0.8 0.3 - 1.2 mg/dL   GFR calc non Af Amer 32 (L) >60 mL/min   GFR calc Af Amer 37 (L) >60 mL/min    Comment: (NOTE) The eGFR has been calculated using the CKD EPI equation. This calculation has not been validated in all clinical situations. eGFR's persistently <60 mL/min signify possible Chronic Kidney Disease.    Anion gap 23 (H) 5 - 15  Lipase, blood     Status: None   Collection Time: 06/15/15  6:38 AM  Result Value Ref Range   Lipase 20 11 - 51 U/L    Ct Abdomen Pelvis W Contrast  06/15/2015  CLINICAL DATA:  Nausea and vomiting for 2 days with right upper quadrant abdominal pain and tenderness EXAM: CT ABDOMEN AND PELVIS WITH CONTRAST TECHNIQUE: Multidetector CT imaging of the abdomen and pelvis was performed using the standard protocol following bolus administration of intravenous  contrast. CONTRAST:  48m ISOVUE-300 IOPAMIDOL (ISOVUE-300) INJECTION 61% COMPARISON:  02/01/2007 FINDINGS: Lower chest: Postsurgical change from left breast surgery noted. Mitral valve calcification. Visualized portions of the lung bases clear. Hepatobiliary: Cholelithiasis is again identified although there appear to be few or gallstones currently than on the prior study. There is 3 cm low-attenuation lesion in the posterior right lobe of the liver that was not present previously. Pancreas: Normal Spleen: Normal Adrenals/Urinary Tract: A 3.4 cm right adrenal mass present that was not present previously. A 3.1 cm left adrenal mass is present that was not present previously. There is bilateral renal cortical atrophy. There is no hydronephrosis. Bladder is decompressed. There appears to be bladder wall thickening. Stomach/Bowel: There is a small hiatal hernia. There is significant thickening of the wall of the rectum and distal half of the sigmoid colon. There is mild inflammatory change surrounding the rectum in the presacral region. There is diverticulosis of the sigmoid colon. There is diverticulosis of the descending colon. The appendix is normal. Duodenum is normal but most of the jejunum is distended and demonstrates air-fluid levels. Dilatation is up to a diameter of 3.8 cm. The transition is in the right lower abdomen with decompression of the ileum. Distal loops of decompressed small bowel show fecalized material. Vascular/Lymphatic: Atherosclerotic calcification of the aortoiliac vessels. No significant abdominal or pelvic adenopathy. Reproductive: Uterus not  visualized.  No pelvic masses. Other: Subtle focus of hyperattenuation inferior left adnexa into left rectovesical compartment. Significance uncertain with possible causes including mildly prominent pelvic veins or less likely contrast from a perforated sigmoid diverticulum, considered unlikely given absence of free air. Musculoskeletal: No acute  musculoskeletal findings. IMPRESSION: 1. Findings consistent with small-bowel obstruction likely due to adhesions near the junction of the jejunum and ileum 2. Cholelithiasis without secondary sign of cholecystitis by CT scan 3. New masses in the right hepatic lobe and in the bilateral adrenal glands. These could represent metastatic lesions in further evaluation with MRI is recommended. 4. There is diverticulosis involving the descending colon and sigmoid colon. There is wall thickening involving the distal half of the sigmoid colon and more severely, the rectum. Findings suggest distal colitis possibly of infectious origin. 5. Subtle hyperattenuation left adnexal region as discussed above likely representing prominent pelvic veins in the area, but exact etiology and potential relationship to inflamed distal colon uncertain and deserving follow up CT imaging to reassess. Electronically Signed   By: Skipper Cliche M.D.   On: 06/15/2015 08:09    Review of Systems  Constitutional: Negative.   HENT: Negative.   Eyes: Negative.   Respiratory: Negative.   Cardiovascular: Negative.   Gastrointestinal: Positive for nausea, vomiting, diarrhea (started in the ED here at [facility].) and constipation. Negative for blood in stool and melena.  Genitourinary:       SHE LEAKS A GREAT DEAL  Musculoskeletal: Positive for joint pain (ARTHRIITIS ALL OVER.).  Skin: Negative.   Neurological: Negative.   Endo/Heme/Allergies: Negative.   Psychiatric/Behavioral: Positive for depression. The patient is nervous/anxious (SHE HAS PANIC ATTACKS, OVER THE LAST 5 YEARS, BETTER WITH XANAX).    Blood pressure 130/75, pulse 75, temperature 98.6 F (37 C), temperature source Oral, resp. rate 16, height _0  (1.6 m), weight 77.111 kg (170 lb), SpO2 93 %. Physical Exam  Constitutional: She is oriented to person, place, and time. She appears well-developed and well-nourished. No distress.  HENT:  Head: Normocephalic and atraumatic.   Eyes: Conjunctivae and EOM are normal. Right eye exhibits no discharge. Left eye exhibits no discharge. No scleral icterus.  Neck: Normal range of motion. Neck supple. No JVD present. No tracheal deviation present. No thyromegaly present.  Cardiovascular: Normal rate, regular rhythm, normal heart sounds and intact distal pulses.   No murmur heard. Respiratory: Effort normal and breath sounds normal. No respiratory distress. She has no wheezes. She has no rales. She exhibits no tenderness.  GI: Soft. She exhibits distension. She exhibits no mass. There is no tenderness. There is no rebound and no guarding.  BOWEL SOUNDS HYPERACTIVE  Musculoskeletal: She exhibits no edema or tenderness.  Lymphadenopathy:    She has no cervical adenopathy.  Neurological: She is alert and oriented to person, place, and time. No cranial nerve deficit.  Skin: Skin is warm and dry. No rash noted. She is not diaphoretic. No erythema. No pallor.  Psychiatric: She has a normal mood and affect. Her behavior is normal. Judgment and thought content normal.    Assessment/Plan: SBO Colitis sigmoid and rectum  New liver lesion and bilateral adrenal masses Cholelithiasis  Diverticulosis without diverticulitis Mild renal insuffiencey/dehydration Hx of left breast cancer with mastectomy, chemotherapy and radiation therapy AODM Hypertension  Hypothyroid Arthritis    Plan:  She just started having a large amount of diarrhea here in the ED. I plan to get a plain film and see how her stomach and distension look  now.  If she is still distended or has more vomiting we will place and NG.  If she  continues to have diarrhea, I will hold off on the NG.  Start her on some Zosyn for CT reading of colitis.  Recheck films in AM and see how she is doing.  Discuss other CT findings with Dr. Brantley Stage.     Reaghan Kawa 06/15/2015, 1:00 PM

## 2015-06-15 NOTE — ED Notes (Signed)
MD aware of pt.

## 2015-06-15 NOTE — ED Provider Notes (Signed)
CSN: [id number]     Arrival date & time 06/15/15  0541 History   First MD Initiated Contact with Patient 06/15/15 [phone]     Chief Complaint  Patient presents with  . Emesis     (Consider location/radiation/quality/duration/timing/severity/associated sxs/prior Treatment) HPI Patient is transferred from [facility] [place] for surgical consultation. She is diagnosed with small bowel obstruction by CT scan. Patient denies any abdominal pain at this time. She reports that she has started having bowel movements since in the emergency department. She reports one episode of stool incontinence. She has not had recent vomiting. At this time she denies active nausea or pain. Past Medical History  Diagnosis Date  . Cancer (HCC)     breast  . Hypertension   . Diabetes mellitus   . Arthritis   . Breast cancer [facility])    Past Surgical History  Procedure Laterality Date  . Abdominal hysterectomy    . Breast surgery     Family History  Problem Relation Age of Onset  . Diabetes Mother   . Hypertension Mother   . Diabetes Sister   . Hypertension Sister    Social History  Substance Use Topics  . Smoking status: Never Smoker   . Smokeless tobacco: None  . Alcohol Use: No   OB History    No data available     Review of Systems 10 Systems reviewed and are negative for acute change except as noted in the HPI.   Allergies  Review of patient's allergies indicates no known allergies.  Home Medications   Prior to Admission medications   Medication Sig Start Date End Date Taking? Authorizing Provider  alendronate (FOSAMAX) 70 MG tablet Take 70 mg by mouth every 7 (seven) days. Take with a full glass of water on an empty stomach.    Yes Historical Provider, MD  ALPRAZolam Duanne Moron) 0.25 MG tablet Take 0.25 mg by mouth at bedtime as needed.   Yes Historical Provider, MD  atorvastatin (LIPITOR) 20 MG tablet Take 20 mg by mouth daily.   Yes Historical Provider, MD  Calcium Carbonate (CALCIUM 500  PO) Take by mouth.     Yes Historical Provider, MD  escitalopram (LEXAPRO) 10 MG tablet  01/06/15  Yes Historical Provider, MD  glipiZIDE (GLUCOTROL) 5 MG tablet Take 10 mg by mouth daily.    Yes Historical Provider, MD  levothyroxine (SYNTHROID, LEVOTHROID) 112 MCG tablet Take 112 mcg by mouth daily.     Yes Historical Provider, MD  metFORMIN (GLUMETZA) 500 MG (MOD) 24 hr tablet Take 1,000 mg by mouth 2 (two) times daily with a meal.    Yes Historical Provider, MD  metoprolol tartrate (LOPRESSOR) 25 MG tablet Take 50 mg by mouth 2 (two) times daily.    Yes Historical Provider, MD  METOPROLOL TARTRATE PO Take 50 mg by mouth 2 (two) times daily.    Yes Historical Provider, MD  Multiple Vitamins-Minerals (MULTIVITAMIN PO) Take by mouth.   Yes Historical Provider, MD  omeprazole (PRILOSEC) 20 MG capsule  01/21/14  Yes Historical Provider, MD  ONE TOUCH ULTRA TEST test strip  01/03/13  Yes Historical Provider, MD  pioglitazone (ACTOS) 15 MG tablet Take 15 mg by mouth daily.     Yes Historical Provider, MD  POTASSIUM PO Take 8 mEq by mouth 2 (two) times daily.    Yes Historical Provider, MD  triamterene-hydrochlorothiazide (MAXZIDE-25) 37.5-25 MG tablet Take 1 tablet by mouth daily. 04/07/15  Yes Historical Provider, MD  verapamil (COVERA  HS) 240 MG (CO) 24 hr tablet Take 240 mg by mouth at bedtime.     Yes Historical Provider, MD  TRIAMTERENE-HCTZ PO Take by mouth daily.     Historical Provider, MD   BP 135/74 mmHg  Pulse 76  Temp(Src) 98.6 F (37 C) (Oral)  Resp 21  Ht 5\' 3"  (1.6 m)  Wt 170 lb (77.111 kg)  BMI 30.12 kg/m2  SpO2 98% Physical Exam  Constitutional: She is oriented to person, place, and time. She appears well-developed and well-nourished.  HENT:  Head: Normocephalic and atraumatic.  Eyes: EOM are normal.  Neck: Neck supple.  Cardiovascular: Normal rate, regular rhythm, normal heart sounds and intact distal pulses.   Pulmonary/Chest: Effort normal and breath sounds normal.   Abdominal: Soft. She exhibits no distension. There is no tenderness.  Hyperactive BS  Musculoskeletal: Normal range of motion. She exhibits no edema.  Neurological: She is alert and oriented to person, place, and time. She has normal strength. Coordination normal. GCS eye subscore is 4. GCS verbal subscore is 5. GCS motor subscore is 6.  Skin: Skin is warm, dry and intact.  Psychiatric: She has a normal mood and affect.    ED Course  Procedures (including critical care time) Labs Review Labs Reviewed  URINALYSIS, ROUTINE W REFLEX MICROSCOPIC (NOT AT [facility]) - Abnormal; Notable for the following:    APPearance CLOUDY (*)    Bilirubin Urine MODERATE (*)    Ketones, ur 15 (*)    Protein, ur 30 (*)    Leukocytes, UA MODERATE (*)    All other components within normal limits  CBC WITH DIFFERENTIAL/PLATELET - Abnormal; Notable for the following:    WBC 10.9 (*)    RBC 5.49 (*)    RDW 19.0 (*)    Neutro Abs 9.3 (*)    All other components within normal limits  COMPREHENSIVE METABOLIC PANEL - Abnormal; Notable for the following:    Chloride 94 (*)    Glucose, Bld 213 (*)    BUN 35 (*)    Creatinine, Ser 1.57 (*)    Alkaline Phosphatase 162 (*)    GFR calc non Af Amer 32 (*)    GFR calc Af Amer 37 (*)    Anion gap 23 (*)    All other components within normal limits  URINE MICROSCOPIC-ADD ON - Abnormal; Notable for the following:    Squamous Epithelial / LPF 0-5 (*)    Bacteria, UA FEW (*)    All other components within normal limits  CBG MONITORING, ED - Abnormal; Notable for the following:    Glucose-Capillary 186 (*)    All other components within normal limits  URINE CULTURE  LIPASE, BLOOD    Imaging Review Ct Abdomen Pelvis W Contrast  06/15/2015  CLINICAL DATA:  Nausea and vomiting for 2 days with right upper quadrant abdominal pain and tenderness EXAM: CT ABDOMEN AND PELVIS WITH CONTRAST TECHNIQUE: Multidetector CT imaging of the abdomen and pelvis was performed using the  standard protocol following bolus administration of intravenous contrast. CONTRAST:  17mL ISOVUE-300 IOPAMIDOL (ISOVUE-300) INJECTION 61% COMPARISON:  02/01/2007 FINDINGS: Lower chest: Postsurgical change from left breast surgery noted. Mitral valve calcification. Visualized portions of the lung bases clear. Hepatobiliary: Cholelithiasis is again identified although there appear to be few or gallstones currently than on the prior study. There is 3 cm low-attenuation lesion in the posterior right lobe of the liver that was not present previously. Pancreas: Normal Spleen: Normal Adrenals/Urinary Tract: A 3.4  cm right adrenal mass present that was not present previously. A 3.1 cm left adrenal mass is present that was not present previously. There is bilateral renal cortical atrophy. There is no hydronephrosis. Bladder is decompressed. There appears to be bladder wall thickening. Stomach/Bowel: There is a small hiatal hernia. There is significant thickening of the wall of the rectum and distal half of the sigmoid colon. There is mild inflammatory change surrounding the rectum in the presacral region. There is diverticulosis of the sigmoid colon. There is diverticulosis of the descending colon. The appendix is normal. Duodenum is normal but most of the jejunum is distended and demonstrates air-fluid levels. Dilatation is up to a diameter of 3.8 cm. The transition is in the right lower abdomen with decompression of the ileum. Distal loops of decompressed small bowel show fecalized material. Vascular/Lymphatic: Atherosclerotic calcification of the aortoiliac vessels. No significant abdominal or pelvic adenopathy. Reproductive: Uterus not visualized.  No pelvic masses. Other: Subtle focus of hyperattenuation inferior left adnexa into left rectovesical compartment. Significance uncertain with possible causes including mildly prominent pelvic veins or less likely contrast from a perforated sigmoid diverticulum, considered  unlikely given absence of free air. Musculoskeletal: No acute musculoskeletal findings. IMPRESSION: 1. Findings consistent with small-bowel obstruction likely due to adhesions near the junction of the jejunum and ileum 2. Cholelithiasis without secondary sign of cholecystitis by CT scan 3. New masses in the right hepatic lobe and in the bilateral adrenal glands. These could represent metastatic lesions in further evaluation with MRI is recommended. 4. There is diverticulosis involving the descending colon and sigmoid colon. There is wall thickening involving the distal half of the sigmoid colon and more severely, the rectum. Findings suggest distal colitis possibly of infectious origin. 5. Subtle hyperattenuation left adnexal region as discussed above likely representing prominent pelvic veins in the area, but exact etiology and potential relationship to inflamed distal colon uncertain and deserving follow up CT imaging to reassess. Electronically Signed   By: Skipper Cliche M.D.   On: 06/15/2015 08:09   I have personally reviewed and evaluated these images and lab results as part of my medical decision-making.   EKG Interpretation   Date/Time:  Monday June 15 2015 06:41:48 EDT Ventricular Rate:  73 PR Interval:  194 QRS Duration: 102 QT Interval:  444 QTC Calculation: 489 R Axis:   -62 Text Interpretation:  Sinus rhythm Left anterior fascicular block LVH with  secondary repolarization abnormality Anterior infarct, old No significant  change since last tracing Confirmed by Glynn Octave [phone]) on  06/15/2015 8:19:38 AM     Consult: Carin Hock. surgery MDM   Final diagnoses:  SBO (small bowel obstruction) ([place])   Patient alert and nontoxic. Vital signs are stable. Surgery consult at for spelled all obstruction.     Charlesetta Shanks, MD 06/24/15 [phone]

## 2015-06-15 NOTE — ED Notes (Signed)
MD at bedside. 

## 2015-06-15 NOTE — ED Notes (Signed)
Pt arrives from [facility] for SBO per CT, vomiting x 2 days.  Hx: breast cx.   Zofran and Pepcid given PTA, here to see Surgeon-Cornett.   Alert and family at bedside on arrival.

## 2015-06-15 NOTE — H&P (Signed)
Attestation signed by Erroll Luna, MD at 06/15/2015 3:15 PM  Seen examined  And chart reviewed   Had N/V on Saturday but none today and now diarhea  Abdomen benign Needs PET scan at some point as outpatient Admit for IVF and NPO for now   NGT if she vomits    Expand All Collapse All   Reason for Consult:  Nausea and vomiting Referring Physician: Calla Kicks PCP: Gara Kroner, MD   Monica Morrison is an 70 y.o. female.  HPI: 70 y/o who presented to [facility] with nausea and vomiting. Symptoms started on Thursday 06/11/15.  She was OK on Friday, but sx of nausea and vomiting started again on 06/12/15.  She has not been able to keep any thing down since then.  She presented to ED at [facility] this AM.  She notes no BM she can remember for 2-3 days, but now is having significant diarrhea here in the ED at [facility].  No fever, minimal abdominal pain most lying down over the last 48 hours.    Work up shows she is afebrile, BP up some on admit. Her creatinine is 1.57, glucose is 213.  Alk phos is up other LFT's are normal.  WVC 10.9.  UA shows 6-30 WBC, but is nitrite negative.  EKG shows SR, with old anterior infarct, LAF.    CT shows:  1. Findings consistent with small-bowel obstruction likely due to adhesions near the junction of the jejunum and ileum  2. Cholelithiasis without secondary sign of cholecystitis by CT scan 3. New masses in the right hepatic lobe and in the bilateral adrenal glands. These could represent metastatic lesions in further evaluation with MRI is recommended. 4. There is diverticulosis involving the descending colon and sigmoid colon. There is wall thickening involving the distal half of the sigmoid colon and more severely, the rectum. Findings suggest distal colitis possibly of infectious origin. 5. Subtle hyperattenuation left adnexal region as discussed above likely representing prominent pelvic veins in the area, but exact etiology and potential relationship to inflamed distal colon  uncertain and deserving follow up CT imaging to reassess. We are ask to see.      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  Abdominal hysterectomy       .  Breast surgery           Family History   Problem  Relation  Age of Onset   .  Diabetes  Mother     .  Hypertension  Mother     .  Diabetes  Sister     .  Hypertension  Sister       Social History:  reports that she has never smoked. She does not have any smokeless tobacco history on file. She reports that she does not drink alcohol or use illicit drugs. Tobacco:  Never ETOH:  None   Drugs:  None  Retired from [employer]    Allergies: No Known Allergies    Prior to Admission medications    Medication  Sig  Start Date  End Date  Taking?  Authorizing Provider   alendronate (FOSAMAX) 70 MG tablet  Take 70 mg by mouth every 7 (seven) days. Take with a full glass of water on an empty stomach.       Yes  Historical Provider, MD   ALPRAZolam Duanne Moron) 0.25 MG tablet  Take 0.25 mg by mouth at bedtime as needed.      Yes  Historical Provider, MD   atorvastatin (LIPITOR) 20 MG tablet  Take 20 mg by mouth daily.      Yes  Historical Provider, MD   Calcium Carbonate (CALCIUM 500 PO)  Take by mouth.        Yes  Historical Provider, MD   escitalopram (LEXAPRO) 10 MG tablet    01/06/15    Yes  Historical Provider, MD   glipiZIDE (GLUCOTROL) 5 MG tablet  Take 10 mg by mouth daily.       Yes  Historical Provider, MD   levothyroxine (SYNTHROID, LEVOTHROID) 112 MCG tablet  Take 112 mcg by mouth daily.        Yes  Historical  Provider, MD   metFORMIN (GLUMETZA) 500 MG (MOD) 24 hr tablet  Take 1,000 mg by mouth 2 (two) times daily with a meal.       Yes  Historical Provider, MD   metoprolol tartrate (LOPRESSOR) 25 MG tablet  Take 50 mg by mouth 2 (two) times daily.       Yes  Historical Provider, MD   METOPROLOL TARTRATE PO  Take 50 mg by mouth 2 (two) times daily.       Yes  Historical Provider, MD   Multiple Vitamins-Minerals (MULTIVITAMIN PO)  Take by mouth.      Yes  Historical Provider, MD   omeprazole (PRILOSEC) 20 MG capsule    01/21/14    Yes  Historical Provider, MD   ONE TOUCH ULTRA TEST test strip    01/03/13    Yes  Historical Provider, MD   pioglitazone (ACTOS) 15 MG tablet  Take 15 mg by mouth daily.        Yes  Historical Provider, MD   POTASSIUM PO  Take 8 mEq by mouth 2 (two) times daily.       Yes  Historical Provider, MD   triamterene-hydrochlorothiazide (MAXZIDE-25) 37.5-25 MG tablet  Take 1 tablet by mouth daily.  04/07/15    Yes  Historical Provider, MD   verapamil (COVERA HS) 240 MG (CO) 24 hr tablet  Take 240 mg by mouth at bedtime.        Yes  Historical Provider, MD   TRIAMTERENE-HCTZ PO  Take by mouth daily.         Historical Provider, MD       .  sodium chloride  100 mL/hr (06/15/15 1255)    PRN: Anti-infectives      None           Lab Results Last 48 Hours    Results for orders placed or performed during the hospital encounter of 06/15/15 (from the past 48 hour(s))   Urinalysis, Routine w reflex microscopic (not at [facility])     Status: Abnormal     Collection Time: 06/15/15  6:03 AM   Result  Value  Ref Range  Color, Urine  YELLOW  YELLOW     APPearance  CLOUDY (A)  CLEAR     Specific Gravity, Urine  1.027  1.005 - 1.030     pH  6.0  5.0 - 8.0     Glucose, UA  NEGATIVE  NEGATIVE mg/dL     Hgb urine dipstick  NEGATIVE  NEGATIVE     Bilirubin Urine  MODERATE (A)  NEGATIVE     Ketones, ur  15 (A)  NEGATIVE mg/dL     Protein, ur  30 (A)  NEGATIVE mg/dL     Nitrite  NEGATIVE   NEGATIVE     Leukocytes, UA  MODERATE (A)  NEGATIVE   Urine microscopic-add on     Status: Abnormal     Collection Time: 06/15/15  6:03 AM   Result  Value  Ref Range     Squamous Epithelial / LPF  0-5 (A)  NONE SEEN     WBC, UA  6-30  0 - 5 WBC/hpf     RBC / HPF  6-30  0 - 5 RBC/hpf     Bacteria, UA  FEW (A)  NONE SEEN   CBG monitoring, ED     Status: Abnormal     Collection Time: 06/15/15  6:05 AM   Result  Value  Ref Range     Glucose-Capillary  186 (H)  65 - 99 mg/dL   CBC with Differential/Platelet     Status: Abnormal     Collection Time: 06/15/15  6:38 AM   Result  Value  Ref Range     WBC  10.9 (H)  4.0 - 10.5 K/uL     RBC  5.49 (H)  3.87 - 5.11 MIL/uL     Hemoglobin  14.5  12.0 - 15.0 g/dL     HCT  44.4  36.0 - 46.0 %     MCV  80.9  78.0 - 100.0 fL     MCH  26.4  26.0 - 34.0 pg     MCHC  32.7  30.0 - 36.0 g/dL     RDW  19.0 (H)  11.5 - 15.5 %     Platelets  282  150 - 400 K/uL     Neutrophils Relative %  86  %     Neutro Abs  9.3 (H)  1.7 - 7.7 K/uL     Lymphocytes Relative  7  %     Lymphs Abs  0.8  0.7 - 4.0 K/uL     Monocytes Relative  7  %     Monocytes Absolute  0.7  0.1 - 1.0 K/uL     Eosinophils Relative  0  %     Eosinophils Absolute  0.0  0.0 - 0.7 K/uL     Basophils Relative  0  %     Basophils Absolute  0.0  0.0 - 0.1 K/uL   Comprehensive metabolic panel     Status: Abnormal     Collection Time: 06/15/15  6:38 AM   Result  Value  Ref Range     Sodium  139  135 - 145 mmol/L     Potassium  3.6  3.5 - 5.1 mmol/L     Chloride  94 (L)  101 - 111 mmol/L     CO2  22  22 - 32 mmol/L     Glucose, Bld  213 (H)  65 - 99 mg/dL     BUN  35 (H)  6 - 20 mg/dL     Creatinine, Ser  1.57 (H)  0.44 - 1.00 mg/dL     Calcium  10.2  8.9 - 10.3 mg/dL     Total Protein  7.9  6.5 - 8.1 g/dL     Albumin  4.2  3.5 - 5.0 g/dL     AST  26  15 - 41 U/L     ALT  30  14 - 54 U/L     Alkaline Phosphatase  162 (H)  38 - 126 U/L     Total Bilirubin  0.8  0.3 - 1.2 mg/dL     GFR  calc non Af Amer  32 (L)  >60 mL/min     GFR calc Af Amer  37 (L)  >60 mL/min       Comment:  (NOTE)  The eGFR has been calculated using the CKD EPI equation. This calculation has not been validated in all clinical situations. eGFR's persistently <60 mL/min signify possible Chronic Kidney Disease.      Anion gap  23 (H)  5 - 15   Lipase, blood     Status: None     Collection Time: 06/15/15  6:38 AM   Result  Value  Ref Range     Lipase  20  11 - 51 U/L        Imaging Results (Last 48 hours)    Ct Abdomen Pelvis W Contrast  06/15/2015  CLINICAL DATA:  Nausea and vomiting for 2 days with right upper quadrant abdominal pain and tenderness EXAM: CT ABDOMEN AND PELVIS WITH CONTRAST TECHNIQUE: Multidetector CT imaging of the abdomen and pelvis was performed using the standard protocol following bolus administration of intravenous contrast. CONTRAST:  34m ISOVUE-300 IOPAMIDOL (ISOVUE-300) INJECTION 61% COMPARISON:  02/01/2007 FINDINGS: Lower chest: Postsurgical change from left breast surgery noted. Mitral valve calcification. Visualized portions of the lung bases clear. Hepatobiliary: Cholelithiasis is again identified although there appear to be few or gallstones currently than on the prior study. There is 3 cm low-attenuation lesion in the posterior right lobe of the liver that was not present previously. Pancreas: Normal Spleen: Normal Adrenals/Urinary Tract: A 3.4 cm right adrenal mass present that was not present previously. A 3.1 cm left adrenal mass is present that was not present previously. There is bilateral renal cortical atrophy. There is no hydronephrosis. Bladder is decompressed. There appears to be bladder wall thickening. Stomach/Bowel: There is a small hiatal hernia. There is significant thickening of the wall of the rectum and distal half of the sigmoid colon. There is mild inflammatory change surrounding the rectum in the presacral region. There is diverticulosis of the sigmoid colon.  There is diverticulosis of the descending colon. The appendix is normal. Duodenum is normal but most of the jejunum is distended and demonstrates air-fluid levels. Dilatation is up to a diameter of 3.8 cm. The transition is in the right lower abdomen with decompression of the ileum. Distal loops of decompressed small bowel show fecalized material. Vascular/Lymphatic: Atherosclerotic calcification of the aortoiliac vessels. No significant abdominal or pelvic adenopathy. Reproductive: Uterus not visualized.  No pelvic masses. Other: Subtle focus of hyperattenuation inferior left adnexa into left rectovesical compartment. Significance uncertain with possible causes including mildly prominent pelvic veins or less likely contrast from a perforated sigmoid diverticulum, considered unlikely given absence of free air. Musculoskeletal: No acute musculoskeletal findings. IMPRESSION: 1. Findings consistent with small-bowel obstruction likely due to adhesions near the junction of the jejunum and ileum  2. Cholelithiasis without secondary sign of cholecystitis by CT scan 3. New masses in the right hepatic lobe and in the bilateral adrenal glands. These could represent metastatic lesions in further evaluation with MRI is recommended. 4. There is diverticulosis involving the descending colon and sigmoid colon. There is wall thickening involving the distal half of the sigmoid colon and more severely, the rectum. Findings suggest distal colitis possibly of infectious origin. 5. Subtle hyperattenuation left adnexal region as discussed above likely representing prominent pelvic veins in the area, but exact etiology and potential relationship to inflamed distal colon uncertain and deserving follow up CT imaging to reassess. Electronically Signed   By: Skipper Cliche M.D.   On: 06/15/2015 08:09      Review of Systems  Constitutional: Negative.   HENT: Negative.   Eyes: Negative.   Respiratory: Negative.   Cardiovascular:  Negative.   Gastrointestinal: Positive for nausea, vomiting, diarrhea (started in the ED here at [facility].) and constipation. Negative for blood in stool and melena.  Genitourinary:        SHE LEAKS A GREAT DEAL  Musculoskeletal: Positive for joint pain (ARTHRIITIS ALL OVER.).  Skin: Negative.   Neurological: Negative.   Endo/Heme/Allergies: Negative.   Psychiatric/Behavioral: Positive for depression. The patient is nervous/anxious (SHE HAS PANIC ATTACKS, OVER THE LAST 5 YEARS, BETTER WITH XANAX).    Blood pressure 130/75, pulse 75, temperature 98.6 F (37 C), temperature source Oral, resp. rate 16, height _0  (1.6 m), weight 77.111 kg (170 lb), SpO2 93 %. Physical Exam  Constitutional: She is oriented to person, place, and time. She appears well-developed and well-nourished. No distress.  HENT:   Head: Normocephalic and atraumatic.  Eyes: Conjunctivae and EOM are normal. Right eye exhibits no discharge. Left eye exhibits no discharge. No scleral icterus.  Neck: Normal range of motion. Neck supple. No JVD present. No tracheal deviation present. No thyromegaly present.  Cardiovascular: Normal rate, regular rhythm, normal heart sounds and intact distal pulses.    No murmur heard. Respiratory: Effort normal and breath sounds normal. No respiratory distress. She has no wheezes. She has no rales. She exhibits no tenderness.  GI: Soft. She exhibits distension. She exhibits no mass. There is no tenderness. There is no rebound and no guarding.  BOWEL SOUNDS HYPERACTIVE  Musculoskeletal: She exhibits no edema or tenderness.  Lymphadenopathy:    She has no cervical adenopathy.  Neurological: She is alert and oriented to person, place, and time. No cranial nerve deficit.  Skin: Skin is warm and dry. No rash noted. She is not diaphoretic. No erythema. No pallor.  Psychiatric: She has a normal mood and affect. Her behavior is normal. Judgment and thought content normal.     Assessment/Plan: SBO Colitis sigmoid and rectum   New liver lesion and bilateral adrenal masses Cholelithiasis   Diverticulosis without diverticulitis Mild renal insuffiencey/dehydration Hx of left breast cancer with mastectomy, chemotherapy and radiation therapy AODM Hypertension   Hypothyroid Arthritis    Plan:  She just started having a large amount of diarrhea here in the ED. I plan to get a plain film and see how her stomach and distension look now.  If she is still distended or has more vomiting we will place and NG.  If she  continues to have diarrhea, I will hold off on the NG.  Start her on some Zosyn for CT reading of colitis.  Recheck films in AM and see how she is doing.  Discuss other CT findings with  Dr. Brantley Stage.     Jenifer Struve 06/15/2015, 1:00 PM              Cosigned by: Erroll Luna, MD at 06/15/2015  3:15 PM  Revision History      Date/Time User Provider Type Action    06/15/2015  3:15 PM Erroll Luna, MD Physician Cosign    06/15/2015  2:56 PM Earnstine Regal, PA-C Physician Assistant Sign     Routing History      Date/Time From To Method    06/15/2015  3:15 PM Erroll Luna, MD Earnstine Regal, PA-C In [facility]    06/15/2015  3:15 PM Erroll Luna, MD Antony Contras, MD Fax

## 2015-06-15 NOTE — ED Notes (Signed)
[facility] at bedside for transport. 

## 2015-06-15 NOTE — ED Notes (Addendum)
C/o nv, worse when lying down, vomited x 6-7 in last 24 hrs, onset Saturday afternoon after eating custard, last ate chicken noodle soup Sunday after noon, states, kept it down b/c I stayed upright for awhile after eating, (denies: dizziness, sob, pain, fever, diarrhea, bleeding or other sx), also admits to some constipation, generally weak, "feel full". Last BM Saturday am "she thinks". Alert, NAD, calm, interactive. Belching/ hiccups noted. Denies change in hx since last updated in December 2016.

## 2015-06-15 NOTE — ED Notes (Signed)
Attempted report 

## 2015-06-15 NOTE — ED Notes (Signed)
Dr. Oni into room. 

## 2015-06-15 NOTE — ED Provider Notes (Addendum)
CSN: [id number]     Arrival date & time 06/15/15  0541 History   First MD Initiated Contact with Patient 06/15/15 [id number]     Chief Complaint  Patient presents with  . Emesis     (Consider location/radiation/quality/duration/timing/severity/associated sxs/prior Treatment) HPI   Monica Morrison is a 70 y.o. female with past medical history of breast cancer, hypertension, diabetes presenting today with nausea and vomiting. Patient states this began 2 days ago. It is normally after she eats custard or fried chicken. She has multiple episodes of vomiting. She denies any abdominal pain. She had one episode of diarrhea. She denies fevers, dysuria, hematuria. She states this has never happened to her before in the past. Her only abdominal surgeries and hysterectomy. She took Dramamine without any significant relief because she threw that up. There are no further complaints.  10 Systems reviewed and are negative for acute change except as noted in the HPI.     Past Medical History  Diagnosis Date  . Cancer (HCC)     breast  . Hypertension   . Diabetes mellitus   . Arthritis   . Breast cancer [facility])    Past Surgical History  Procedure Laterality Date  . Abdominal hysterectomy    . Breast surgery     Family History  Problem Relation Age of Onset  . Diabetes Mother   . Hypertension Mother   . Diabetes Sister   . Hypertension Sister    Social History  Substance Use Topics  . Smoking status: Never Smoker   . Smokeless tobacco: None  . Alcohol Use: No   OB History    No data available     Review of Systems    Allergies  Review of patient's allergies indicates no known allergies.  Home Medications   Prior to Admission medications   Medication Sig Start Date End Date Taking? Authorizing Provider  alendronate (FOSAMAX) 70 MG tablet Take 70 mg by mouth every 7 (seven) days. Take with a full glass of water on an empty stomach.     Historical Provider, MD  ALPRAZolam Duanne Moron) 0.25  MG tablet Take 0.25 mg by mouth at bedtime as needed.    Historical Provider, MD  atorvastatin (LIPITOR) 20 MG tablet Take 20 mg by mouth daily.    Historical Provider, MD  Calcium Carbonate (CALCIUM 500 PO) Take by mouth.      Historical Provider, MD  escitalopram (LEXAPRO) 10 MG tablet  01/06/15   Historical Provider, MD  glipiZIDE (GLUCOTROL) 5 MG tablet Take 10 mg by mouth daily.     Historical Provider, MD  levothyroxine (SYNTHROID, LEVOTHROID) 112 MCG tablet Take 112 mcg by mouth daily.      Historical Provider, MD  metFORMIN (GLUMETZA) 500 MG (MOD) 24 hr tablet Take 1,000 mg by mouth 2 (two) times daily with a meal.     Historical Provider, MD  METOPROLOL TARTRATE PO Take 50 mg by mouth 2 (two) times daily.     Historical Provider, MD  Multiple Vitamins-Minerals (MULTIVITAMIN PO) Take by mouth.    Historical Provider, MD  omeprazole (PRILOSEC) 20 MG capsule  01/21/14   Historical Provider, MD  ONE TOUCH ULTRA TEST test strip  01/03/13   Historical Provider, MD  pioglitazone (ACTOS) 15 MG tablet Take 15 mg by mouth daily.      Historical Provider, MD  POTASSIUM PO Take 8 mEq by mouth 2 (two) times daily.     Historical Provider, MD  TRIAMTERENE-HCTZ PO Take by  mouth daily.     Historical Provider, MD  verapamil (COVERA HS) 240 MG (CO) 24 hr tablet Take 240 mg by mouth at bedtime.      Historical Provider, MD   BP 145/90 mmHg  Pulse 90  Temp(Src) 98.2 F (36.8 C) (Oral)  Resp 16  Ht 5' 3"  (1.6 m)  Wt 170 lb (77.111 kg)  BMI 30.12 kg/m2  SpO2 100% Physical Exam  Constitutional: She is oriented to person, place, and time. She appears well-developed and well-nourished. No distress.  HENT:  Head: Normocephalic and atraumatic.  Nose: Nose normal.  Mouth/Throat: Oropharynx is clear and moist. No oropharyngeal exudate.  Eyes: Conjunctivae and EOM are normal. Pupils are equal, round, and reactive to light. No scleral icterus.  Neck: Normal range of motion. Neck supple. No JVD present. No  tracheal deviation present. No thyromegaly present.  Cardiovascular: Normal rate, regular rhythm and normal heart sounds.  Exam reveals no gallop and no friction rub.   No murmur heard. Pulmonary/Chest: Effort normal and breath sounds normal. No respiratory distress. She has no wheezes. She exhibits no tenderness.  Abdominal: Soft. Bowel sounds are normal. She exhibits no distension and no mass. There is tenderness. There is no rebound and no guarding.  Right upper and lower quadrant tenderness to palpation.  Musculoskeletal: Normal range of motion. She exhibits no edema or tenderness.  Lymphadenopathy:    She has no cervical adenopathy.  Neurological: She is alert and oriented to person, place, and time. No cranial nerve deficit. She exhibits normal muscle tone.  Skin: Skin is warm and dry. No rash noted. No erythema. No pallor.  Nursing note and vitals reviewed.   ED Course  Procedures (including critical care time) Labs Review Labs Reviewed  URINALYSIS, ROUTINE W REFLEX MICROSCOPIC (NOT AT [facility]) - Abnormal; Notable for the following:    APPearance CLOUDY (*)    Bilirubin Urine MODERATE (*)    Ketones, ur 15 (*)    Protein, ur 30 (*)    Leukocytes, UA MODERATE (*)    All other components within normal limits  CBC WITH DIFFERENTIAL/PLATELET - Abnormal; Notable for the following:    WBC 10.9 (*)    RBC 5.49 (*)    RDW 19.0 (*)    Neutro Abs 9.3 (*)    All other components within normal limits  COMPREHENSIVE METABOLIC PANEL - Abnormal; Notable for the following:    Chloride 94 (*)    Glucose, Bld 213 (*)    BUN 35 (*)    Creatinine, Ser 1.57 (*)    Alkaline Phosphatase 162 (*)    GFR calc non Af Amer 32 (*)    GFR calc Af Amer 37 (*)    Anion gap 23 (*)    All other components within normal limits  URINE MICROSCOPIC-ADD ON - Abnormal; Notable for the following:    Squamous Epithelial / LPF 0-5 (*)    Bacteria, UA FEW (*)    All other components within normal limits  CBG  MONITORING, ED - Abnormal; Notable for the following:    Glucose-Capillary 186 (*)    All other components within normal limits  URINE CULTURE  LIPASE, BLOOD    Imaging Review Ct Abdomen Pelvis W Contrast  06/15/2015  CLINICAL DATA:  Nausea and vomiting for 2 days with right upper quadrant abdominal pain and tenderness EXAM: CT ABDOMEN AND PELVIS WITH CONTRAST TECHNIQUE: Multidetector CT imaging of the abdomen and pelvis was performed using the standard protocol following  bolus administration of intravenous contrast. CONTRAST:  71m ISOVUE-300 IOPAMIDOL (ISOVUE-300) INJECTION 61% COMPARISON:  02/01/2007 FINDINGS: Lower chest: Postsurgical change from left breast surgery noted. Mitral valve calcification. Visualized portions of the lung bases clear. Hepatobiliary: Cholelithiasis is again identified although there appear to be few or gallstones currently than on the prior study. There is 3 cm low-attenuation lesion in the posterior right lobe of the liver that was not present previously. Pancreas: Normal Spleen: Normal Adrenals/Urinary Tract: A 3.4 cm right adrenal mass present that was not present previously. A 3.1 cm left adrenal mass is present that was not present previously. There is bilateral renal cortical atrophy. There is no hydronephrosis. Bladder is decompressed. There appears to be bladder wall thickening. Stomach/Bowel: There is a small hiatal hernia. There is significant thickening of the wall of the rectum and distal half of the sigmoid colon. There is mild inflammatory change surrounding the rectum in the presacral region. There is diverticulosis of the sigmoid colon. There is diverticulosis of the descending colon. The appendix is normal. Duodenum is normal but most of the jejunum is distended and demonstrates air-fluid levels. Dilatation is up to a diameter of 3.8 cm. The transition is in the right lower abdomen with decompression of the ileum. Distal loops of decompressed small bowel show  fecalized material. Vascular/Lymphatic: Atherosclerotic calcification of the aortoiliac vessels. No significant abdominal or pelvic adenopathy. Reproductive: Uterus not visualized.  No pelvic masses. Other: Subtle focus of hyperattenuation inferior left adnexa into left rectovesical compartment. Significance uncertain with possible causes including mildly prominent pelvic veins or less likely contrast from a perforated sigmoid diverticulum, considered unlikely given absence of free air. Musculoskeletal: No acute musculoskeletal findings. IMPRESSION: 1. Findings consistent with small-bowel obstruction likely due to adhesions near the junction of the jejunum and ileum 2. Cholelithiasis without secondary sign of cholecystitis by CT scan 3. New masses in the right hepatic lobe and in the bilateral adrenal glands. These could represent metastatic lesions in further evaluation with MRI is recommended. 4. There is diverticulosis involving the descending colon and sigmoid colon. There is wall thickening involving the distal half of the sigmoid colon and more severely, the rectum. Findings suggest distal colitis possibly of infectious origin. 5. Subtle hyperattenuation left adnexal region as discussed above likely representing prominent pelvic veins in the area, but exact etiology and potential relationship to inflamed distal colon uncertain and deserving follow up CT imaging to reassess. Electronically Signed   By: RSkipper ClicheM.D.   On: 06/15/2015 08:09   I have personally reviewed and evaluated these images and lab results as part of my medical decision-making.   EKG Interpretation None      MDM   Final diagnoses:  None    Patient presents emergency part for nausea and vomiting. Physical exam reveals abdominal tenderness. Will obtain CT scan for evaluation. She was given Zofran with good relief of her nausea. EKG and blood work is pending.  8:17 AM CT reveals an SBO.  Also possible mets to the liver  with her remote history of breast cancer.  Alk phos is elevated as well.  Will consult with general surgery for transfer and admission.  8:43 AM I spoke with Dr. CBrantley Stagewho requests [facility]  Dr. KWilson Singerin the ED accepts the patient.  Please page Dr. cBrantley Stageupon arrival.   AEverlene Balls MD 06/15/15 [place] MD 06/15/15 [phone]

## 2015-06-15 NOTE — ED Notes (Signed)
Pt CBG, 89. Nurse was notified.

## 2015-06-15 NOTE — ED Notes (Signed)
Pt alert, NAD, calm, interactive, resps e/u, speech clear, sitting in w/c, from registration to b/r, attempting urine sample, friend with pt.

## 2015-06-15 NOTE — ED Notes (Signed)
Starting PO contrast, tolerating at this time.

## 2015-06-15 NOTE — ED Notes (Signed)
Patient transported to X-ray 

## 2015-06-15 NOTE — ED Notes (Signed)
CBG 186 

## 2015-06-16 ENCOUNTER — Inpatient Hospital Stay (HOSPITAL_COMMUNITY): Payer: Commercial Managed Care - HMO

## 2015-06-16 LAB — BASIC METABOLIC PANEL
ANION GAP: 15 (ref 5–15)
ANION GAP: 13 (ref 5–15)
BUN: 28 mg/dL — ABNORMAL HIGH (ref 6–20)
BUN: 29 mg/dL — ABNORMAL HIGH (ref 6–20)
CALCIUM: 8.2 mg/dL — AB (ref 8.9–10.3)
CALCIUM: 8.4 mg/dL — AB (ref 8.9–10.3)
CO2: 26 mmol/L (ref 22–32)
CO2: 24 mmol/L (ref 22–32)
CREATININE: 1.72 mg/dL — AB (ref 0.44–1.00)
Chloride: 100 mmol/L — ABNORMAL LOW (ref 101–111)
Chloride: 102 mmol/L (ref 101–111)
Creatinine, Ser: 1.82 mg/dL — ABNORMAL HIGH (ref 0.44–1.00)
GFR calc Af Amer: 31 mL/min — ABNORMAL LOW (ref >60)
GFR, EST AFRICAN AMERICAN: 34 mL/min — AB (ref >60)
GFR, EST NON AFRICAN AMERICAN: 29 mL/min — AB (ref >60)
GFR, EST NON AFRICAN AMERICAN: 27 mL/min — AB (ref >60)
GLUCOSE: 183 mg/dL — AB (ref 65–99)
Glucose, Bld: 103 mg/dL — ABNORMAL HIGH (ref 65–99)
Potassium: 2.9 mmol/L — ABNORMAL LOW (ref 3.5–5.1)
Potassium: 3 mmol/L — ABNORMAL LOW (ref 3.5–5.1)
SODIUM: 141 mmol/L (ref 135–145)
Sodium: 139 mmol/L (ref 135–145)

## 2015-06-16 LAB — GLUCOSE, CAPILLARY
GLUCOSE-CAPILLARY: 87 mg/dL (ref 65–99)
GLUCOSE-CAPILLARY: 90 mg/dL (ref 65–99)
GLUCOSE-CAPILLARY: 163 mg/dL — AB (ref 65–99)
Glucose-Capillary: 102 mg/dL — ABNORMAL HIGH (ref 65–99)
Glucose-Capillary: 166 mg/dL — ABNORMAL HIGH (ref 65–99)
Glucose-Capillary: 71 mg/dL (ref 65–99)

## 2015-06-16 LAB — CBC
HEMATOCRIT: 37.3 % (ref 36.0–46.0)
Hemoglobin: 11.5 g/dL — ABNORMAL LOW (ref 12.0–15.0)
MCH: 25.7 pg — ABNORMAL LOW (ref 26.0–34.0)
MCHC: 30.8 g/dL (ref 30.0–36.0)
MCV: 83.3 fL (ref 78.0–100.0)
Platelets: 220 10*3/uL (ref 150–400)
RBC: 4.48 MIL/uL (ref 3.87–5.11)
RDW: 17.9 % — AB (ref 11.5–15.5)
WBC: 8.1 10*3/uL (ref 4.0–10.5)

## 2015-06-16 LAB — URINE CULTURE

## 2015-06-16 LAB — APTT: aPTT: 31 seconds (ref 24–37)

## 2015-06-16 LAB — MAGNESIUM: Magnesium: 1.6 mg/dL — ABNORMAL LOW (ref 1.7–2.4)

## 2015-06-16 LAB — PROTIME-INR
INR: 1.19 (ref 0.00–1.49)
PROTHROMBIN TIME: 15.3 s — AB (ref 11.6–15.2)

## 2015-06-16 MED ORDER — POTASSIUM CHLORIDE CRYS ER 20 MEQ PO TBCR
20.0000 meq | EXTENDED_RELEASE_TABLET | Freq: Once | ORAL | Status: AC
  Administered 2015-06-16: 20 meq via ORAL
  Filled 2015-06-16: qty 1

## 2015-06-16 MED ORDER — KCL IN DEXTROSE-NACL 40-5-0.9 MEQ/L-%-% IV SOLN
INTRAVENOUS | Status: DC
  Administered 2015-06-16: 13:00:00 via INTRAVENOUS
  Filled 2015-06-16 (×3): qty 1000

## 2015-06-16 MED ORDER — KCL IN DEXTROSE-NACL 20-5-0.9 MEQ/L-%-% IV SOLN
INTRAVENOUS | Status: DC
  Administered 2015-06-16 – 2015-06-17 (×2): via INTRAVENOUS
  Filled 2015-06-16 (×5): qty 1000

## 2015-06-16 MED ORDER — MAGNESIUM SULFATE 2 GM/50ML IV SOLN
2.0000 g | Freq: Once | INTRAVENOUS | Status: AC
  Administered 2015-06-16: 2 g via INTRAVENOUS
  Filled 2015-06-16: qty 50

## 2015-06-16 MED ORDER — METOPROLOL TARTRATE 5 MG/5ML IV SOLN
2.5000 mg | Freq: Four times a day (QID) | INTRAVENOUS | Status: DC
  Administered 2015-06-16 – 2015-06-20 (×15): 2.5 mg via INTRAVENOUS
  Filled 2015-06-16 (×12): qty 5

## 2015-06-16 NOTE — Progress Notes (Signed)
Subjective: She feels much better, still having loose stools.  She is also hungry.    Objective: Vital signs in last 24 hours: Temp:  [97.7 F (36.5 C)-98.6 F (37 C)] 97.9 F (36.6 C) (04/25 0616) Pulse Rate:  [70-83] 81 (04/25 0616) Resp:  [14-23] 17 (04/25 0616) BP: (123-144)/(63-80) 123/66 mmHg (04/25 0616) SpO2:  [91 %-100 %] 98 % (04/25 0616) Last BM Date: 06/15/15 240 PO,  Stool x 3 yesterday and once today Afebrile, VSS Creatinine is up to 1.72 with IV fluids, K+ down to 2.9, mag 1.6  Film this AM shows contrast in the colon, no obstruction or ileus WBC is back to normal, creatinine is still the issue.  Intake/Output from previous day:  04/24 0701 - 04/25 0700 In: 3091.7 [P.O.:240; I.V.:2651.7; IV Piggyback:200] Out: 1 [Urine:1] Intake/Output this shift:    General appearance: alert, cooperative and no distress Resp: clear to auscultation bilaterally GI: soft, non-tender; bowel sounds normal; no masses,  no organomegaly  Lab Results:   Recent Labs  06/15/15 1656 06/16/15 0607  WBC 11.4* 8.1  HGB 13.0 11.5*  HCT 41.3 37.3  PLT 219 220    BMET  Recent Labs  06/15/15 0638 06/15/15 1656 06/16/15 0607  NA 139  --  141  K 3.6  --  2.9*  CL 94*  --  100*  CO2 22  --  26  GLUCOSE 213*  --  103*  BUN 35*  --  28*  CREATININE 1.57* 1.46* 1.72*  CALCIUM 10.2  --  8.2*   PT/INR  Recent Labs  06/16/15 0607  LABPROT 15.3*  INR 1.19     Recent Labs Lab 06/15/15 0638  AST 26  ALT 30  ALKPHOS 162*  BILITOT 0.8  PROT 7.9  ALBUMIN 4.2     Lipase     Component Value Date/Time   LIPASE 20 06/15/2015 [id number]     Studies/Results: Dg Abd 1 View  06/15/2015  CLINICAL DATA:  Vomiting brown liquid X 3 days, diarrhea since she got the emergency room today, Hx of acid reflux, hiatal hernia, EXAM: ABDOMEN - 1 VIEW COMPARISON:  06/15/2015 CT scan at 07:41 FINDINGS: Intravenous contrast is seen within the renal collecting systems ureters and bladder.  Mildly dilated loops of small bowel again identified with some gas into the proximal colon and decompressed more distal colon. IMPRESSION: Bowel gas pattern nonspecific radiographically but does again demonstrates some dilated loops of small bowel with the possibility again of small-bowel obstruction. Electronically Signed   By: Skipper Cliche M.D.   On: 06/15/2015 15:32   Ct Abdomen Pelvis W Contrast  06/15/2015  CLINICAL DATA:  Nausea and vomiting for 2 days with right upper quadrant abdominal pain and tenderness EXAM: CT ABDOMEN AND PELVIS WITH CONTRAST TECHNIQUE: Multidetector CT imaging of the abdomen and pelvis was performed using the standard protocol following bolus administration of intravenous contrast. CONTRAST:  8mL ISOVUE-300 IOPAMIDOL (ISOVUE-300) INJECTION 61% COMPARISON:  02/01/2007 FINDINGS: Lower chest: Postsurgical change from left breast surgery noted. Mitral valve calcification. Visualized portions of the lung bases clear. Hepatobiliary: Cholelithiasis is again identified although there appear to be few or gallstones currently than on the prior study. There is 3 cm low-attenuation lesion in the posterior right lobe of the liver that was not present previously. Pancreas: Normal Spleen: Normal Adrenals/Urinary Tract: A 3.4 cm right adrenal mass present that was not present previously. A 3.1 cm left adrenal mass is present that was not present previously. There is  bilateral renal cortical atrophy. There is no hydronephrosis. Bladder is decompressed. There appears to be bladder wall thickening. Stomach/Bowel: There is a small hiatal hernia. There is significant thickening of the wall of the rectum and distal half of the sigmoid colon. There is mild inflammatory change surrounding the rectum in the presacral region. There is diverticulosis of the sigmoid colon. There is diverticulosis of the descending colon. The appendix is normal. Duodenum is normal but most of the jejunum is distended and  demonstrates air-fluid levels. Dilatation is up to a diameter of 3.8 cm. The transition is in the right lower abdomen with decompression of the ileum. Distal loops of decompressed small bowel show fecalized material. Vascular/Lymphatic: Atherosclerotic calcification of the aortoiliac vessels. No significant abdominal or pelvic adenopathy. Reproductive: Uterus not visualized.  No pelvic masses. Other: Subtle focus of hyperattenuation inferior left adnexa into left rectovesical compartment. Significance uncertain with possible causes including mildly prominent pelvic veins or less likely contrast from a perforated sigmoid diverticulum, considered unlikely given absence of free air. Musculoskeletal: No acute musculoskeletal findings. IMPRESSION: 1. Findings consistent with small-bowel obstruction likely due to adhesions near the junction of the jejunum and ileum 2. Cholelithiasis without secondary sign of cholecystitis by CT scan 3. New masses in the right hepatic lobe and in the bilateral adrenal glands. These could represent metastatic lesions in further evaluation with MRI is recommended. 4. There is diverticulosis involving the descending colon and sigmoid colon. There is wall thickening involving the distal half of the sigmoid colon and more severely, the rectum. Findings suggest distal colitis possibly of infectious origin. 5. Subtle hyperattenuation left adnexal region as discussed above likely representing prominent pelvic veins in the area, but exact etiology and potential relationship to inflamed distal colon uncertain and deserving follow up CT imaging to reassess. Electronically Signed   By: Skipper Cliche M.D.   On: 06/15/2015 08:09   Dg Abd 2 Views  06/16/2015  CLINICAL DATA:  Small bowel obstruction. EXAM: ABDOMEN - 2 VIEW COMPARISON:  June 15, 2015. FINDINGS: The bowel gas pattern is normal. Residual contrast is noted in the colon. Surgical clips are noted in the pelvis and abdomen. No abnormal  calcifications are noted. IMPRESSION: No evidence of bowel obstruction or ileus. Electronically Signed   By: Marijo Conception, M.D.   On: 06/16/2015 08:53    Medications: . famotidine (PEPCID) IV  20 mg Intravenous Q12H  . heparin  5,000 Units Subcutaneous Q8H  . insulin aspart  1-3 Units Subcutaneous Q4H  . levothyroxine  56 mcg Intravenous Daily  . metoprolol  2.5 mg Intravenous Q6H  . piperacillin-tazobactam (ZOSYN)  IV  3.375 g Intravenous Q8H   . sodium chloride 100 mL/hr (06/15/15 1255)    Assessment/Plan SBO Colitis sigmoid and rectum  New liver lesion and bilateral adrenal masses Cholelithiasis  Diverticulosis without diverticulitis Mild renal insuffiencey/dehydration Hypokalemia/hypomagnesemia Hx of left breast cancer with mastectomy, chemotherapy and radiation therapy AODM Hypertension  Hypothyroid Arthritis  FEN: IV fluids [id number] day 2 VTE: Heparin    Plan:  Replace K+ and Mag++, continue to hydrate and continue antibiotics, advance her diet, she is starving. I am going to recheck her BMP at 1600, if creatinine is not better will ask Medicine to see.     LOS: 1 day    Teddy Pena 06/16/2015 [phone]

## 2015-06-16 NOTE — Clinical Documentation Improvement (Addendum)
General Surgery  Labs :    BUN 6 - 20 mg/dL 28 (H)  35 (H)        Creatinine, Ser 0.44 - 1.00 mg/dL 1.72 (H) 1.46 (H) 1.57 (H       GFR 29/35/32   CKD Stage I - GFR greater than or equal to 90  CKD Stage II - GFR 60-89  CKD Stage III - GFR 30-59  CKD Stage IV - GFR 15-29  CKD Stage V - GFR < 15  Acute Kidney Injury  Acute Kidney Injury on CKD  ( stage I-V)  Other condition  Unable to clinically determine   Supporting Information: Dehydration, DM 2, HTN   Treatment D 5 NS @ 100 ml/hr   Evaluation: BMET daily   Please exercise your independent, professional judgment when responding. A specific answer is not anticipated or expected.   Thank You, [place] [phone]

## 2015-06-17 ENCOUNTER — Encounter (HOSPITAL_COMMUNITY): Payer: Self-pay | Admitting: Family Medicine

## 2015-06-17 DIAGNOSIS — E118 Type 2 diabetes mellitus with unspecified complications: Secondary | ICD-10-CM | POA: Diagnosis present

## 2015-06-17 DIAGNOSIS — C50919 Malignant neoplasm of unspecified site of unspecified female breast: Secondary | ICD-10-CM | POA: Diagnosis present

## 2015-06-17 DIAGNOSIS — N189 Chronic kidney disease, unspecified: Secondary | ICD-10-CM | POA: Insufficient documentation

## 2015-06-17 LAB — BASIC METABOLIC PANEL
ANION GAP: 10 (ref 5–15)
BUN: 27 mg/dL — ABNORMAL HIGH (ref 6–20)
CALCIUM: 7.7 mg/dL — AB (ref 8.9–10.3)
CO2: 23 mmol/L (ref 22–32)
Chloride: 105 mmol/L (ref 101–111)
Creatinine, Ser: 2.03 mg/dL — ABNORMAL HIGH (ref 0.44–1.00)
GFR, EST AFRICAN AMERICAN: 27 mL/min — AB (ref >60)
GFR, EST NON AFRICAN AMERICAN: 24 mL/min — AB (ref >60)
Glucose, Bld: 180 mg/dL — ABNORMAL HIGH (ref 65–99)
POTASSIUM: 3.4 mmol/L — AB (ref 3.5–5.1)
Sodium: 138 mmol/L (ref 135–145)

## 2015-06-17 LAB — GLUCOSE, CAPILLARY
GLUCOSE-CAPILLARY: 139 mg/dL — AB (ref 65–99)
GLUCOSE-CAPILLARY: 146 mg/dL — AB (ref 65–99)
GLUCOSE-CAPILLARY: 198 mg/dL — AB (ref 65–99)
GLUCOSE-CAPILLARY: 144 mg/dL — AB (ref 65–99)
GLUCOSE-CAPILLARY: 186 mg/dL — AB (ref 65–99)
Glucose-Capillary: 80 mg/dL (ref 65–99)

## 2015-06-17 LAB — GASTROINTESTINAL PANEL BY PCR, STOOL (REPLACES STOOL CULTURE)
ASTROVIRUS: NOT DETECTED
Adenovirus F40/41: NOT DETECTED
CYCLOSPORA CAYETANENSIS: NOT DETECTED
Campylobacter species: DETECTED — AB
Cryptosporidium: NOT DETECTED
E. COLI O157: NOT DETECTED
ENTEROTOXIGENIC E COLI (ETEC): NOT DETECTED
Entamoeba histolytica: NOT DETECTED
Enteroaggregative E coli (EAEC): NOT DETECTED
Enteropathogenic E coli (EPEC): NOT DETECTED
Giardia lamblia: NOT DETECTED
Norovirus GI/GII: NOT DETECTED
Plesimonas shigelloides: NOT DETECTED
ROTAVIRUS A: NOT DETECTED
SALMONELLA SPECIES: NOT DETECTED
SAPOVIRUS (I, II, IV, AND V): NOT DETECTED
SHIGA LIKE TOXIN PRODUCING E COLI (STEC): NOT DETECTED
SHIGELLA/ENTEROINVASIVE E COLI (EIEC): NOT DETECTED
VIBRIO SPECIES: NOT DETECTED
Vibrio cholerae: NOT DETECTED
Yersinia enterocolitica: NOT DETECTED

## 2015-06-17 LAB — C DIFFICILE QUICK SCREEN W PCR REFLEX
C DIFFICILE (CDIFF) TOXIN: NEGATIVE
C Diff antigen: NEGATIVE
C Diff interpretation: NEGATIVE

## 2015-06-17 LAB — MAGNESIUM: MAGNESIUM: 2.2 mg/dL (ref 1.7–2.4)

## 2015-06-17 MED ORDER — FAMOTIDINE IN NACL 20-0.9 MG/50ML-% IV SOLN
20.0000 mg | INTRAVENOUS | Status: DC
  Administered 2015-06-18: 20 mg via INTRAVENOUS
  Filled 2015-06-17: qty 50

## 2015-06-17 NOTE — Consult Note (Signed)
Medical Consultation   Monica Morrison  NLZ:[id number]  DOB: 1945/09/21  DOA: 06/15/2015  PCP: Gara Kroner, MD  Outpatient Specialists:   Patient Care Team: Antony Contras, MD as PCP - General (Family Medicine)  Requesting physician: Dr. Marcello Moores ([facility])  Reason for consultation: Elevated Creatinine    History of Present Illness:Monica Morrison is an 70 y.o. female with a history of Left breast Cancer, Osteopenia, CKD, Stage 3B, with BL Cr 1.5, DM, admitted on 06/15/2015 due to increased nausea vomiting and watery diarrhea, as well as decreased oral intake for 2-3 days prior to admission. At that time, she denied any fever, and has minimal right lower quadrant pain.at the ED, she was afebrile, but lab was noted to have elevated creatinine at 1.57, normal alkaline phosphatase and LFTs, and a white count of 10.9. Her UA  was negative. EKG was unremarkable. CT of the abdomen was consistent with small bowel obstruction, likely due to additions near the jejunum/ileum junction. She also had diverticulosis involving the descending colon and sigmoid colon. Findings were suggestive of distal colitis possibly of infectious origin.of note, incidental right hepatic lobe and bilateral adrenal glands masses were seen, suspicious for metastatic disease.  She was placed on bowel rest, given broad-spectrum antibiotics with Zozyn while awaiting for cultures, and she was started on IV fluids at 100 mL normal saline since. Until yesterday afternoon, the patient was on nothing by mouth, advancing to clear liquids since last evening, which she has been able to tolerate. The diarrhea has subsided, and she denies any nausea or vomiting at this time.Denies fevers, chills, night sweats, vision changes, or mucositis. Denies any respiratory complaints. Denies any chest pain or palpitations. Denies lower extremity swelling.  Denies abdominal pain. Denies any dysuria but does have a history of urinary incontinence  followed by Urology. Denies abnormal skin rashes, or neuropathy. Denies any bleeding issues such as epistaxis, hematemesis, hematuria or hematochezia. Ambulating without difficulty. Her labs are remarkable for acute on chronic elevation of her creatinine since admission, as follows: 1.57-> 1.46-> 1.70->1.8->2.03 today. EGFR 24. BP 149/77 mmHg  Pulse 71  Temp(Src) 98 F (36.7 C) (Oral)  Resp 16  Ht 5' 3"  (1.6 m)  Wt 77.111 kg (170 lb)  BMI 30.12 kg/m2  SpO2 98% CBC essentially unremarkable.    Review of Systems:  As per HPI otherwise 10 point review of systems negative.     Past Medical History: Past Medical History  Diagnosis Date  . Cancer (HCC)     breast  . Hypertension   . Diabetes mellitus   . Arthritis   . Breast cancer [facility])        Past Surgical History: Past Surgical History  Procedure Laterality Date  . Abdominal hysterectomy    . Breast surgery       Allergies:  No Known Allergies   Social History:  reports that she has never smoked. She does not have any smokeless tobacco history on file. She reports that she does not drink alcohol or use illicit drugs.   Family History: Family History  Problem Relation Age of Onset  . Diabetes Mother   . Hypertension Mother   . Diabetes Sister   . Hypertension Sister     Family history reviewed and not pertinent    Physical Exam: Filed Vitals:   06/16/15 1349 06/16/15 1751 06/16/15 2215 06/17/15 0619  BP: 146/72 121/58 102/59 149/77  Pulse: 65  67 68 71  Temp: 98 F (36.7 C) 98.1 F (36.7 C) 98.5 F (36.9 C) 98 F (36.7 C)  TempSrc: Oral Oral Oral   Resp: 18 16 16 16   Height:      Weight:      SpO2: 100% 100% 99% 98%    Constitutional: Appears calm,  alert and awake, oriented x3, not in any acute distress. Eyes: PERLA, EOMI, irises appear normal, anicteric sclera,  ENMT: external ears and nose appear normal, normal hearing or hard of hearing.  Lips appears normal, oropharynx mucosa, tongue,  posterior pharynx appear normal  Neck: neck appears normal, no masses, normal ROM, no thyromegaly, no JVD  CVS: S1-S2 clear, 2/6 harsh systolic murmur, no rubs or gallops, no LE edema, normal pedal pulses  Respiratory: clear to auscultation bilaterally, no wheezing, rales or rhonchi. Respiratory effort normal. No accessory muscle use.  Abdomen: soft, RLQ tenderness,nondistended, active bowel sounds, no hepatosplenomegaly, no hernias  Musculoskeletal: no cyanosis, clubbing or edema noted bilaterally  Joint/bones/muscle exam, strength, no contractures, arthritic changes noted in hands Neuro: Cranial nerves II-XII intact, strength, sensation, reflexes Psych: judgement and insight appear normal, stable mood and affect, mental status Skin: no rashes or lesions or ulcers, no induration or nodules   Data reviewed:  I have personally reviewed following labs and imaging studies Labs:  CBC:  Recent Labs Lab 06/15/15 0638 06/15/15 1656 06/16/15 0607  WBC 10.9* 11.4* 8.1  NEUTROABS 9.3*  --   --   HGB 14.5 13.0 11.5*  HCT 44.4 41.3 37.3  MCV 80.9 82.9 83.3  PLT 282 219 182    Basic Metabolic Panel:  Recent Labs Lab 06/15/15 0638 06/15/15 1656 06/16/15 0607 06/16/15 1618 06/17/15 0525  NA 139  --  141 139 138  K 3.6  --  2.9* 3.0* 3.4*  CL 94*  --  100* 102 105  CO2 22  --  26 24 23   GLUCOSE 213*  --  103* 183* 180*  BUN 35*  --  28* 29* 27*  CREATININE 1.57* 1.46* 1.72* 1.82* 2.03*  CALCIUM 10.2  --  8.2* 8.4* 7.7*  MG  --   --  1.6*  --  2.2   GFR Estimated Creatinine Clearance: 25.4 mL/min (by C-G formula based on Cr of 2.03). Liver Function Tests:  Recent Labs Lab 06/15/15 0638  AST 26  ALT 30  ALKPHOS 162*  BILITOT 0.8  PROT 7.9  ALBUMIN 4.2    Recent Labs Lab 06/15/15 0638  LIPASE 20   No results for input(s): AMMONIA in the last 168 hours. Coagulation profile  Recent Labs Lab 06/16/15 0607  INR 1.19    Cardiac Enzymes: No results for input(s):  CKTOTAL, CKMB, CKMBINDEX, TROPONINI in the last 168 hours. BNP: Invalid input(s): POCBNP CBG:  Recent Labs Lab 06/16/15 1139 06/16/15 1645 06/16/15 2008 06/17/15 0004 06/17/15 0411  GLUCAP 71 166* 163* 80 139*   Urinalysis    Component Value Date/Time   COLORURINE YELLOW 06/15/2015 0603   APPEARANCEUR CLOUDY* 06/15/2015 0603   LABSPEC 1.027 06/15/2015 0603   PHURINE 6.0 06/15/2015 0603   GLUCOSEU NEGATIVE 06/15/2015 0603   HGBUR NEGATIVE 06/15/2015 0603   BILIRUBINUR MODERATE* 06/15/2015 0603   KETONESUR 15* 06/15/2015 0603   PROTEINUR 30* 06/15/2015 0603   NITRITE NEGATIVE 06/15/2015 0603   LEUKOCYTESUR MODERATE* 06/15/2015 0603     Sepsis Labs Invalid input(s): PROCALCITONIN,  WBC,  LACTICIDVEN Microbiology Recent Results (from the past 240 hour(s))  Urine culture  Status: None   Collection Time: 06/15/15  6:03 AM  Result Value Ref Range Status   Specimen Description URINE, CATHETERIZED  Final   Special Requests NONE  Final   Culture MULTIPLE SPECIES PRESENT, SUGGEST RECOLLECTION  Final   Report Status 06/16/2015 FINAL  Final       Inpatient Medications:   Scheduled Meds: . famotidine (PEPCID) IV  20 mg Intravenous Q12H  . heparin  5,000 Units Subcutaneous Q8H  . insulin aspart  1-3 Units Subcutaneous Q4H  . levothyroxine  56 mcg Intravenous Daily  . metoprolol  2.5 mg Intravenous Q6H  . piperacillin-tazobactam (ZOSYN)  IV  3.375 g Intravenous Q8H   Continuous Infusions: . dextrose 5 % and 0.9 % NaCl with KCl 20 mEq/L 100 mL/hr at 06/16/15 2042     Radiological Exams on Admission: Dg Abd 1 View  06/15/2015  CLINICAL DATA:  Vomiting brown liquid X 3 days, diarrhea since she got the emergency room today, Hx of acid reflux, hiatal hernia, EXAM: ABDOMEN - 1 VIEW COMPARISON:  06/15/2015 CT scan at 07:41 FINDINGS: Intravenous contrast is seen within the renal collecting systems ureters and bladder. Mildly dilated loops of small bowel again identified with  some gas into the proximal colon and decompressed more distal colon. IMPRESSION: Bowel gas pattern nonspecific radiographically but does again demonstrates some dilated loops of small bowel with the possibility again of small-bowel obstruction. Electronically Signed   By: Skipper Cliche M.D.   On: 06/15/2015 15:32   Dg Abd 2 Views  06/16/2015  CLINICAL DATA:  Small bowel obstruction. EXAM: ABDOMEN - 2 VIEW COMPARISON:  June 15, 2015. FINDINGS: The bowel gas pattern is normal. Residual contrast is noted in the colon. Surgical clips are noted in the pelvis and abdomen. No abnormal calcifications are noted. IMPRESSION: No evidence of bowel obstruction or ileus. Electronically Signed   By: Marijo Conception, M.D.   On: 06/16/2015 08:53    Impression/Recommendations Active Problems:   SBO (small bowel obstruction) (HCC)   Chronic kidney disease   Acute on Chronic kidney disease in the setting of DM,Stage 3 B IIIB 30-44, today at [facility] due to low EGFR at 24 ,baseline creatinine 1.5.1.57-> 1.46-> 1.70->1.8->2.03 today. EGFR 24, likely due to diarrhea and poor oral intake since admission. She is beginning to take liquids by mouth since last evening. She has been on IVF at 100 cc since admission.  Hold HCTZ, Avoid NSAIDs.  Continue 100 cc/hr IVF until she can increase oral fluids and her Cr improves Discontinue Zosyn once cultures return negative Strict I/O and daily weights  Follow Cr in am.  Other medical issues including HTN, Hypothyroidism, Nausea, Vomiting and Diarrhea, new liver and adrenal lesions,  as per admitting team    Thank you for this consultation.  Our [facility] hospitalist team will follow the patient with you.   Time Spent: 40 mins   [facility] E PA-C [facility] 06/17/2015, 8:23 AM

## 2015-06-17 NOTE — Discharge Summary (Signed)
Physician Discharge Summary  Patient ID: Monica Morrison MRN: [id number] DOB/AGE: 07/23/1945 70 y.o.  Admit date: 06/15/2015 Discharge date: 06/22/2015  Admission Diagnoses: SBO Colitis sigmoid and rectum  New liver lesion and bilateral adrenal masses Mild renal insuffiencey  Cholelithiasis  Diverticulosis without diverticulitis Mild renal insuffiencey/dehydration Hx of left breast cancer with mastectomy, chemotherapy and radiation therapy AODM Hypertension  Hypothyroid Arthritis    Discharge Diagnoses:  SBO Colitis sigmoid and rectum  C diff negative/Positive for Campylobacter species New liver lesion and bilateral adrenal masses Cholelithiasis  Diverticulosis without diverticulitis Mild renal insuffiencey/dehydration Acute on chronic kidney disease  Hypokalemia/hypomagnesemia Hx of left breast cancer with mastectomy, chemotherapy and radiation therapy AODM Hypertension  Hypothyroid (TSH 8.49) Arthritis   Principal Problem:   SBO (small bowel obstruction) (HCC) Active Problems:   Campylobacter enteritis   Acute-on-chronic kidney injury ([place])   Diabetes mellitus with complication (HCC)   Malignant neoplasm of female breast (HCC)   LFT elevation   CKD (chronic kidney disease) stage 3, GFR 30-59 ml/min   PROCEDURES: None  Hospital Course:  70 y/o who presented to [facility] with nausea and vomiting. Symptoms started on Thursday 06/11/15. She was OK on Friday, but sx of nausea and vomiting started again on 06/12/15. She has not been able to keep any thing down since then. She presented to ED at [facility] this AM. She notes no BM she can remember for 2-3 days, but now is having significant diarrhea here in the ED at [facility]. No fever, minimal abdominal pain most lying down over the last 48 hours.  Work up shows she is afebrile, BP up some on admit. Her creatinine is 1.57, glucose is 213. Alk phos is up other LFT's are normal. WVC 10.9. UA shows 6-30 WBC, but is  nitrite negative. EKG shows SR, with old anterior infarct, LAF.  CT shows: 1. Findings consistent with small-bowel obstruction likely due to adhesions near the junction of the jejunum and ileum 2. Cholelithiasis without secondary sign of cholecystitis by CT scan 3. New masses in the right hepatic lobe and in the bilateral adrenal glands. These could represent metastatic lesions in further evaluation with MRI is recommended. 4. There is diverticulosis involving the descending colon and sigmoid colon. There is wall thickening involving the distal half of the sigmoid colon and more severely, the rectum. Findings suggest distal colitis possibly of infectious origin. 5. Subtle hyperattenuation left adnexal region as discussed above likely representing prominent pelvic veins in the area, but exact etiology and potential relationship to inflamed distal colon uncertain and deserving follow up CT imaging to reassess. We saw her in the ED and admitted her with SBO and what appeared to be a distal colitis.  Her Creatinine was slightly elevated and attributed to dehydration.  She had started having diarrhea in the ED and this continued.  She was empirically placed on Zosyn because of the CT showing what appeared to be colitis. She was also placed on IV fluids.   The first day after admission she was feeling much better, but having loose stools.  Her creatinine continued to rise with hydration, her K+ and magnesium became very low.  This was treated with ongoing IV fluids and oral medication.  We ask Medicine to see her on the 2nd day.  It was  Dr. Baker Janus opinion we continue current rx and if no improvement we would go to [place] as a next step. Her creatinine peaked at 2.37.   We stopped antibiotics and increased her diet  after a negative C Diff.  She did acquire a Campylobacter + stool antigen.  Because of her renal failure and her acute renal failure we choose to treat her with 5 days of    She was still having loose  stools and was hungry. No further nausea or vomiting.   After 5 days of azithromycin and starting her on Pepto bismal, her symptoms are improving.  She is having less diarrhea, and soft, more formed stools.   She was ready for d/c by the AM of 06/22/15.  She has new lesions on the liver and both adrenals.  We want her to see her PCP next week and then arrange for oncology to see and follow up, after her renal function is normalized and she is tolerating diet.    CBC Latest Ref Rng 06/20/2015 06/18/2015 06/16/2015  WBC 4.0 - 10.5 K/uL 8.1 6.9 8.1  Hemoglobin 12.0 - 15.0 g/dL 10.0(L) 11.4(L) 11.5(L)  Hematocrit 36.0 - 46.0 % 33.1(L) 37.2 37.3  Platelets 150 - 400 K/uL 172 191 220   CMP Latest Ref Rng 06/22/2015 06/21/2015 06/20/2015  Glucose 65 - 99 mg/dL 129(H) 189(H) 200(H)  BUN 6 - 20 mg/dL 10 9 9   Creatinine 0.44 - 1.00 mg/dL 1.23(H) 1.12(H) 1.17(H)  Sodium 135 - 145 mmol/L 142 142 143  Potassium 3.5 - 5.1 mmol/L 3.9 4.7 3.6  Chloride 101 - 111 mmol/L 115(H) 118(H) 117(H)  CO2 22 - 32 mmol/L 19(L) 15(L) 19(L)  Calcium 8.9 - 10.3 mg/dL 8.7(L) 8.2(L) 7.5(L)  Total Protein 6.5 - 8.1 g/dL 5.7(L) 5.4(L) 5.1(L)  Total Bilirubin 0.3 - 1.2 mg/dL 0.3 0.7 0.4  Alkaline Phos 38 - 126 U/L 228(H) 272(H) 296(H)  AST 15 - 41 U/L 31 48(H) 72(H)  ALT 14 - 54 U/L 112(H) 143(H) 184(H)   Condition on d/c:  Improved     Max creatinine was 2.37, max max ALT 325, AST, 289.      Disposition: 01-Home or Self Care     Medication List    STOP taking these medications        metFORMIN 500 MG (MOD) 24 hr tablet  Commonly known as:  GLUMETZA     POTASSIUM PO     TRIAMTERENE-HCTZ PO     triamterene-hydrochlorothiazide 37.5-25 MG tablet  Commonly known as:  MAXZIDE-25     verapamil 240 MG (CO) 24 hr tablet  Commonly known as:  COVERA HS      TAKE these medications        alendronate 70 MG tablet  Commonly known as:  FOSAMAX  Take 70 mg by mouth every 7 (seven) days. Take with a full glass of  water on an empty stomach.     ALPRAZolam 0.25 MG tablet  Commonly known as:  XANAX  Take 0.25 mg by mouth at bedtime as needed.     atorvastatin 20 MG tablet  Commonly known as:  LIPITOR  Take 20 mg by mouth daily.     bismuth subsalicylate 263 FH/54TG suspension  Commonly known as:  PEPTO BISMOL  USE AS DIRECTED FOR LOOSE STOOLS.     CALCIUM 500 PO  Take by mouth.     escitalopram 10 MG tablet  Commonly known as:  LEXAPRO     glipiZIDE 5 MG tablet  Commonly known as:  GLUCOTROL  Take 10 mg by mouth daily.     levothyroxine 112 MCG tablet  Commonly known as:  SYNTHROID, LEVOTHROID  Take 112 mcg by mouth daily.  metoprolol tartrate 25 MG tablet  Commonly known as:  LOPRESSOR  Take 1 tablet (25 mg total) by mouth 2 (two) times daily.     MULTIVITAMIN PO  Take by mouth.     omeprazole 20 MG capsule  Commonly known as:  PRILOSEC     ONE TOUCH ULTRA TEST test strip  Generic drug:  glucose blood     pioglitazone 15 MG tablet  Commonly known as:  ACTOS  Take 15 mg by mouth daily.     saccharomyces boulardii 250 MG capsule  Commonly known as:  FLORASTOR  YOU CAN BUY THIS AT ANY DRUG STORE, AND FOLLOW DIRECTIONS ON PACKAGE.  USE FOR THE NEXT 2-3 WEEKS TILL YOUR STOOLS ARE NORMAL.       Follow-up Information    Follow up with Marcy Panning, MD.   Specialty:  Oncology   Why:  Her current address is in [place], [place] at [facility].  [phone].  Call her for follow up of the adrenal and liver lesions found on CT scan.      Follow up with Gara Kroner, MD.   Specialty:  Family Medicine   Why:  Call and let him know of your hospitalization and plans to see Dr. Chancy Milroy.  He needs to check your kidney function in 7 days.     Contact information:   [place] [address] [phone]       Follow up with Rulon Eisenmenger, MD. Call in 1 week.   Specialty:  Hematology and Oncology   Why:  for follow up on liver lesion.    Contact  information:   [place] [address] [phone]       Signed: Earnstine Regal 06/22/2015, 2:18 PM

## 2015-06-17 NOTE — Progress Notes (Signed)
Subjective: She is still having some discomfort, but it's hard to pin her down and tell if it is significant.  She had several still loose stools yesterday.  She thinks she may need to go again this AM.  She knows she has had some kidney issues in the past.    Objective: Vital signs in last 24 hours: Temp:  [98 F (36.7 C)-98.5 F (36.9 C)] 98 F (36.7 C) (04/26 0619) Pulse Rate:  [64-71] 71 (04/26 0619) Resp:  [16-18] 16 (04/26 0619) BP: (102-154)/(58-77) 149/77 mmHg (04/26 0619) SpO2:  [98 %-100 %] 98 % (04/26 0619) Last BM Date: 06/16/15 1080 PO 850 urine + BM Afebrile, VSS Creatinine is up to 2.03, K+ up to 3.4, Mag up to 2.2 Intake/Output from previous day: 04/25 0701 - 04/26 0700 In: 3196.7 [P.O.:1080; I.V.:1866.7; IV Piggyback:250] Out: 850 [Urine:850] Intake/Output this shift:    General appearance: alert, cooperative and no distress Resp: clear to auscultation bilaterally GI: soft, not tender, but complains of some pain this AM and some yesterday.    Lab Results:   Recent Labs  06/15/15 1656 06/16/15 0607  WBC 11.4* 8.1  HGB 13.0 11.5*  HCT 41.3 37.3  PLT 219 220    BMET  Recent Labs  06/16/15 1618 06/17/15 0525  NA 139 138  K 3.0* 3.4*  CL 102 105  CO2 24 23  GLUCOSE 183* 180*  BUN 29* 27*  CREATININE 1.82* 2.03*  CALCIUM 8.4* 7.7*   PT/INR  Recent Labs  06/16/15 0607  LABPROT 15.3*  INR 1.19     Recent Labs Lab 06/15/15 0638  AST 26  ALT 30  ALKPHOS 162*  BILITOT 0.8  PROT 7.9  ALBUMIN 4.2     Lipase     Component Value Date/Time   LIPASE 20 06/15/2015 [id number]     Studies/Results: Dg Abd 1 View  06/15/2015  CLINICAL DATA:  Vomiting brown liquid X 3 days, diarrhea since she got the emergency room today, Hx of acid reflux, hiatal hernia, EXAM: ABDOMEN - 1 VIEW COMPARISON:  06/15/2015 CT scan at 07:41 FINDINGS: Intravenous contrast is seen within the renal collecting systems ureters and bladder. Mildly dilated loops of  small bowel again identified with some gas into the proximal colon and decompressed more distal colon. IMPRESSION: Bowel gas pattern nonspecific radiographically but does again demonstrates some dilated loops of small bowel with the possibility again of small-bowel obstruction. Electronically Signed   By: Skipper Cliche M.D.   On: 06/15/2015 15:32   Dg Abd 2 Views  06/16/2015  CLINICAL DATA:  Small bowel obstruction. EXAM: ABDOMEN - 2 VIEW COMPARISON:  June 15, 2015. FINDINGS: The bowel gas pattern is normal. Residual contrast is noted in the colon. Surgical clips are noted in the pelvis and abdomen. No abnormal calcifications are noted. IMPRESSION: No evidence of bowel obstruction or ileus. Electronically Signed   By: Marijo Conception, M.D.   On: 06/16/2015 08:53    Medications: . famotidine (PEPCID) IV  20 mg Intravenous Q12H  . heparin  5,000 Units Subcutaneous Q8H  . insulin aspart  1-3 Units Subcutaneous Q4H  . levothyroxine  56 mcg Intravenous Daily  . metoprolol  2.5 mg Intravenous Q6H  . piperacillin-tazobactam (ZOSYN)  IV  3.375 g Intravenous Q8H    Assessment/Plan SBO Colitis sigmoid and rectum  New liver lesion and bilateral adrenal masses Cholelithiasis  Diverticulosis without diverticulitis Mild renal insuffiencey/dehydration Acute on chronic kidney disease   Hypokalemia/hypomagnesemia Hx of left  breast cancer with mastectomy, chemotherapy and radiation therapy AODM Hypertension  Hypothyroid Arthritis  FEN: IV fluids [id number] day 3 VTE: Heparin  Plan:  I will get stool cultures, hold Zosyn, continue IV fluids, replace K+ and advance diet.  Monitor progress.      LOS: 2 days    Monica Morrison 06/17/2015 [phone]

## 2015-06-18 DIAGNOSIS — R7989 Other specified abnormal findings of blood chemistry: Secondary | ICD-10-CM

## 2015-06-18 DIAGNOSIS — K5669 Other intestinal obstruction: Secondary | ICD-10-CM

## 2015-06-18 DIAGNOSIS — E118 Type 2 diabetes mellitus with unspecified complications: Secondary | ICD-10-CM

## 2015-06-18 DIAGNOSIS — A045 Campylobacter enteritis: Secondary | ICD-10-CM

## 2015-06-18 DIAGNOSIS — N179 Acute kidney failure, unspecified: Secondary | ICD-10-CM

## 2015-06-18 DIAGNOSIS — R945 Abnormal results of liver function studies: Secondary | ICD-10-CM

## 2015-06-18 DIAGNOSIS — N183 Chronic kidney disease, stage 3 unspecified: Secondary | ICD-10-CM | POA: Diagnosis present

## 2015-06-18 DIAGNOSIS — N189 Chronic kidney disease, unspecified: Secondary | ICD-10-CM

## 2015-06-18 LAB — COMPREHENSIVE METABOLIC PANEL
ALBUMIN: 2.7 g/dL — AB (ref 3.5–5.0)
ALK PHOS: 305 U/L — AB (ref 38–126)
ALT: 325 U/L — AB (ref 14–54)
AST: 289 U/L — AB (ref 15–41)
Anion gap: 11 (ref 5–15)
BILIRUBIN TOTAL: 0.6 mg/dL (ref 0.3–1.2)
BUN: 26 mg/dL — AB (ref 6–20)
CALCIUM: 7.5 mg/dL — AB (ref 8.9–10.3)
CO2: 21 mmol/L — AB (ref 22–32)
CREATININE: 2.37 mg/dL — AB (ref 0.44–1.00)
Chloride: 111 mmol/L (ref 101–111)
GFR calc Af Amer: 23 mL/min — ABNORMAL LOW (ref >60)
GFR calc non Af Amer: 20 mL/min — ABNORMAL LOW (ref >60)
GLUCOSE: 175 mg/dL — AB (ref 65–99)
Potassium: 4 mmol/L (ref 3.5–5.1)
SODIUM: 143 mmol/L (ref 135–145)
TOTAL PROTEIN: 5.7 g/dL — AB (ref 6.5–8.1)

## 2015-06-18 LAB — GLUCOSE, CAPILLARY
GLUCOSE-CAPILLARY: 126 mg/dL — AB (ref 65–99)
GLUCOSE-CAPILLARY: 145 mg/dL — AB (ref 65–99)
GLUCOSE-CAPILLARY: 179 mg/dL — AB (ref 65–99)
GLUCOSE-CAPILLARY: 186 mg/dL — AB (ref 65–99)
GLUCOSE-CAPILLARY: 223 mg/dL — AB (ref 65–99)
Glucose-Capillary: 147 mg/dL — ABNORMAL HIGH (ref 65–99)
Glucose-Capillary: 75 mg/dL (ref 65–99)

## 2015-06-18 LAB — CBC
HEMATOCRIT: 37.2 % (ref 36.0–46.0)
HEMOGLOBIN: 11.4 g/dL — AB (ref 12.0–15.0)
MCH: 25.4 pg — ABNORMAL LOW (ref 26.0–34.0)
MCHC: 30.6 g/dL (ref 30.0–36.0)
MCV: 82.9 fL (ref 78.0–100.0)
Platelets: 191 10*3/uL (ref 150–400)
RBC: 4.49 MIL/uL (ref 3.87–5.11)
RDW: 17.8 % — ABNORMAL HIGH (ref 11.5–15.5)
WBC: 6.9 10*3/uL (ref 4.0–10.5)

## 2015-06-18 MED ORDER — ESCITALOPRAM OXALATE 10 MG PO TABS
10.0000 mg | ORAL_TABLET | Freq: Every day | ORAL | Status: DC
  Administered 2015-06-18 – 2015-06-22 (×5): 10 mg via ORAL
  Filled 2015-06-18 (×5): qty 1

## 2015-06-18 MED ORDER — POLYVINYL ALCOHOL 1.4 % OP SOLN
1.0000 [drp] | Freq: Three times a day (TID) | OPHTHALMIC | Status: DC | PRN
  Filled 2015-06-18: qty 15

## 2015-06-18 MED ORDER — ALPRAZOLAM 0.25 MG PO TABS: 0.2500 mg | ORAL_TABLET | Freq: Every evening | ORAL | Status: DC | PRN

## 2015-06-18 MED ORDER — DEXTROSE 5 % IV SOLN
500.0000 mg | INTRAVENOUS | Status: DC
  Administered 2015-06-18: 500 mg via INTRAVENOUS
  Filled 2015-06-18: qty 500

## 2015-06-18 MED ORDER — PIOGLITAZONE HCL 15 MG PO TABS
15.0000 mg | ORAL_TABLET | Freq: Every day | ORAL | Status: DC
  Administered 2015-06-18 – 2015-06-22 (×5): 15 mg via ORAL
  Filled 2015-06-18 (×5): qty 1

## 2015-06-18 MED ORDER — FAMOTIDINE 20 MG PO TABS
20.0000 mg | ORAL_TABLET | Freq: Every day | ORAL | Status: DC
  Administered 2015-06-19 – 2015-06-22 (×4): 20 mg via ORAL
  Filled 2015-06-18 (×4): qty 1

## 2015-06-18 MED ORDER — DEXTROSE-NACL 5-0.9 % IV SOLN
INTRAVENOUS | Status: DC
  Administered 2015-06-18 – 2015-06-21 (×4): via INTRAVENOUS

## 2015-06-18 MED ORDER — LEVOTHYROXINE SODIUM 112 MCG PO TABS
112.0000 ug | ORAL_TABLET | Freq: Every day | ORAL | Status: DC
  Administered 2015-06-19 – 2015-06-22 (×4): 112 ug via ORAL
  Filled 2015-06-18 (×4): qty 1

## 2015-06-18 MED ORDER — AZITHROMYCIN 250 MG PO TABS
500.0000 mg | ORAL_TABLET | Freq: Every day | ORAL | Status: AC
  Administered 2015-06-19 – 2015-06-20 (×2): 500 mg via ORAL
  Filled 2015-06-18 (×2): qty 2

## 2015-06-18 MED ORDER — GLIPIZIDE 10 MG PO TABS
10.0000 mg | ORAL_TABLET | Freq: Every day | ORAL | Status: DC
  Administered 2015-06-18 – 2015-06-22 (×5): 10 mg via ORAL
  Filled 2015-06-18 (×5): qty 1

## 2015-06-18 MED ORDER — HYPROMELLOSE (GONIOSCOPIC) 2.5 % OP SOLN: 1.0000 [drp] | Freq: Three times a day (TID) | OPHTHALMIC | Status: DC | PRN

## 2015-06-18 NOTE — Progress Notes (Signed)
  Subjective: She looks good, but is still having diarrhea, tolerating soft diet well.    Objective: Vital signs in last 24 hours: Temp:  [98.2 F (36.8 C)-98.9 F (37.2 C)] 98.2 F (36.8 C) (04/27 0450) Pulse Rate:  [68-79] 79 (04/27 0900) Resp:  [17-19] 18 (04/27 0900) BP: (126-145)/(64-88) 145/88 mmHg (04/27 0900) SpO2:  [98 %-100 %] 100 % (04/27 0900) Last BM Date: 06/18/15 960  PO  Soft diet Having multiple loose stools.   Afebrile, VSS CBC is stable, CMP shows rising LFT's and creatinine is continuing to rise.   C diff is negative and GI panel is + for Campylobacter species Intake/Output from previous day: 04/26 0701 - 04/27 0700 In: 3510 [P.O.:960; I.V.:2500; IV Piggyback:50] Out: [id number] [Urine:2550; Stool:1] Intake/Output this shift:    General appearance: alert, cooperative and no distress Resp: clear to auscultation bilaterally GI: soft, non-tender; bowel sounds normal; no masses,  no organomegaly  Lab Results:   Recent Labs  06/16/15 0607 06/18/15 0453  WBC 8.1 6.9  HGB 11.5* 11.4*  HCT 37.3 37.2  PLT 220 191    BMET  Recent Labs  06/17/15 0525 06/18/15 0453  NA 138 143  K 3.4* 4.0  CL 105 111  CO2 23 21*  GLUCOSE 180* 175*  BUN 27* 26*  CREATININE 2.03* 2.37*  CALCIUM 7.7* 7.5*   PT/INR  Recent Labs  06/16/15 0607  LABPROT 15.3*  INR 1.19     Recent Labs Lab 06/15/15 0638 06/18/15 0453  AST 26 289*  ALT 30 325*  ALKPHOS 162* 305*  BILITOT 0.8 0.6  PROT 7.9 5.7*  ALBUMIN 4.2 2.7*     Lipase     Component Value Date/Time   LIPASE 20 06/15/2015 0638     Studies/Results: No results found.  Medications: . famotidine (PEPCID) IV  20 mg Intravenous Q24H  . heparin  5,000 Units Subcutaneous Q8H  . insulin aspart  1-3 Units Subcutaneous Q4H  . levothyroxine  56 mcg Intravenous Daily  . metoprolol  2.5 mg Intravenous Q6H    Assessment/Plan SBO Colitis sigmoid and rectum  C diff negative/Positive for Campylobacter  species New liver lesion and bilateral adrenal masses Cholelithiasis  Diverticulosis without diverticulitis Mild renal insuffiencey/dehydration Acute on chronic kidney disease  Hypokalemia/hypomagnesemia Hx of left breast cancer with mastectomy, chemotherapy and radiation therapy AODM Hypertension  Hypothyroid Arthritis  FEN: IV fluids [id number] day 2 completed 06/16/15  Will start Azithromycin today. VTE: Heparin/SCD    Plan:  I have discussed with Dr. Posey Pronto and we plan to treat the campylobacter.  Continue hydration and give her time to get over this and allow renal function to normalize.  LOS: 3 days    Monica Morrison 06/18/2015 [phone]

## 2015-06-18 NOTE — Progress Notes (Signed)
Pt does not need to be on enteric precautions per infectious diseases nurse. Enteric precautions discontinued

## 2015-06-18 NOTE — Care Management Important Message (Signed)
Important Message  Patient Details  Name: Monica Morrison MRN: [id number] Date of Birth: December 26, 1945   Medicare Important Message Given:  Yes    [name] 06/18/2015, 3:11 PM

## 2015-06-18 NOTE — Progress Notes (Signed)
[facility] Consultation Progress Note  Patient: Monica Morrison [id number]   PCP: Gara Kroner, MD DOB: 12-07-1945   DOA: 06/15/2015   DOS: 06/18/2015   Date of Service: the patient was seen and examined on 06/18/2015 Primary service: Md Ccs, MD   Subjective: Patient had 4 episodes of loose watery bowel movement this morning. No active bleeding no nausea. No vomiting. Abdominal pain is also improving. No chest pain or shortness of breath Nutrition: Tolerating oral diet Activity: Walking in the room currently independent Last BM: 06/18/2015  Assessment and Plan: 1. Campylobacter enteritis Patient initially presented with symptoms of small bowel obstruction was also found to be having antritis on the CT scan. Patient was started on Zosyn. Her GI pathogen panel is positive for Campylobacter. Given acute kidney injury liver elevation as well as recurrent persistent diarrhea I would treat her with azithromycin for 3 days. Or until her diarrhea resolves.  2. Acute kidney injury, chronic kidney disease stage III. Renal function worsened from her baseline. Next and likely due to dehydration. Avoid nephrotoxic medication. Continue aggressive IV hydration. Expected the renal function improves as the patient has been urinating adequately.  3. Diabetes mellitus. Patient has been on Actos and glipizide which is continued to get in the hospital. Sugars are well controlled. Monitor clinically.  4. SBO as well as antritis. Conservative management per surgery.  5. Hypothyroidism. Next and continue Synthroid.  6. Essential hypertension. Patient's medications have been switched to IV. Blood pressure well-controlled.  DVT Prophylaxis: subcutaneous Heparin Nutrition: Soft diet Thank you very much for involving [place] in care of your patient.   Disposition: We will continue to follow the patient.  Barriers to safe discharge: Renal function stabilization  Recommendation on  discharge: Pending renal function stabilization. outpatient nephrology referral.      Advance goals of care discussion: full code  Family Communication: family was present at bedside, at the time of interview. The pt provided permission to discuss medical plan with the family. Opportunity was given to ask question and all questions were answered satisfactorily.   Brief Summary of Hospitalization:  Daily update, Procedures: none Antibiotics: Anti-infectives    Start     Dose/Rate Route Frequency Ordered Stop   06/19/15 1000  azithromycin (ZITHROMAX) tablet 500 mg     500 mg Oral Daily 06/18/15 1356 06/21/15 0959   06/18/15 1130  azithromycin (ZITHROMAX) 500 mg in dextrose 5 % 250 mL IVPB  Status:  Discontinued     500 mg 250 mL/hr over 60 Minutes Intravenous Every 24 hours 06/18/15 1006 06/18/15 1355   06/15/15 1500  piperacillin-tazobactam (ZOSYN) IVPB 3.375 g  Status:  Discontinued     3.375 g 12.5 mL/hr over 240 Minutes Intravenous Every 8 hours 06/15/15 1453 06/17/15 0957        Intake/Output Summary (Last 24 hours) at 06/18/15 1859 Last data filed at 06/18/15 1543  Gross per 24 hour  Intake   2080 ml  Output   2600 ml  Net   -520 ml   Filed Weights   06/15/15 0601  Weight: 77.111 kg (170 lb)    Objective: Physical Exam: Filed Vitals:   06/17/15 2142 06/18/15 0450 06/18/15 0900 06/18/15 1300  BP: 129/68 135/65 145/88 121/65  Pulse: 71 68 79 86  Temp: 98.9 F (37.2 C) 98.2 F (36.8 C) 98 F (36.7 C) 98 F (36.7 C)  TempSrc: Oral  Oral Oral  Resp: 19 18 18 22   Height:      Weight:  SpO2: 100% 99% 100% 100%     General: Appear in mild distress, no Rash; Oral Mucosa moist. Cardiovascular: S1 and S2 Present, no Murmur, difficult to assess  JVD Respiratory: Bilateral Air entry present and Clear to Auscultation, no Crackles, no wheezes Abdomen: Bowel Sound present, Soft and mild tenderness Extremities: no Pedal edema, no calf tenderness Neurology: Grossly  no focal neuro deficit.  Data Reviewed: CBC:  Recent Labs Lab 06/15/15 0638 06/15/15 1656 06/16/15 0607 06/18/15 0453  WBC 10.9* 11.4* 8.1 6.9  NEUTROABS 9.3*  --   --   --   HGB 14.5 13.0 11.5* 11.4*  HCT 44.4 41.3 37.3 37.2  MCV 80.9 82.9 83.3 82.9  PLT 282 219 220 [id number]   Basic Metabolic Panel:  Recent Labs Lab 06/15/15 0638 06/15/15 1656 06/16/15 0607 06/16/15 1618 06/17/15 0525 06/18/15 0453  NA 139  --  141 139 138 143  K 3.6  --  2.9* 3.0* 3.4* 4.0  CL 94*  --  100* 102 105 111  CO2 22  --  26 24 23  21*  GLUCOSE 213*  --  103* 183* 180* 175*  BUN 35*  --  28* 29* 27* 26*  CREATININE 1.57* 1.46* 1.72* 1.82* 2.03* 2.37*  CALCIUM 10.2  --  8.2* 8.4* 7.7* 7.5*  MG  --   --  1.6*  --  2.2  --    Liver Function Tests:  Recent Labs Lab 06/15/15 0638 06/18/15 0453  AST 26 289*  ALT 30 325*  ALKPHOS 162* 305*  BILITOT 0.8 0.6  PROT 7.9 5.7*  ALBUMIN 4.2 2.7*    Recent Labs Lab 06/15/15 0638  LIPASE 20   No results for input(s): AMMONIA in the last 168 hours.  Cardiac Enzymes: No results for input(s): CKTOTAL, CKMB, CKMBINDEX, TROPONINI in the last 168 hours. BNP (last 3 results) No results for input(s): BNP in the last 8760 hours.  CBG:  Recent Labs Lab 06/18/15 0037 06/18/15 0430 06/18/15 0815 06/18/15 1207 06/18/15 1657  GLUCAP 179* 145* 186* 147* 75    Recent Results (from the past 240 hour(s))  Urine culture     Status: None   Collection Time: 06/15/15  6:03 AM  Result Value Ref Range Status   Specimen Description URINE, CATHETERIZED  Final   Special Requests NONE  Final   Culture MULTIPLE SPECIES PRESENT, SUGGEST RECOLLECTION  Final   Report Status 06/16/2015 FINAL  Final  C difficile quick scan w PCR reflex     Status: None   Collection Time: 06/17/15 10:03 AM  Result Value Ref Range Status   C Diff antigen NEGATIVE NEGATIVE Final   C Diff toxin NEGATIVE NEGATIVE Final   C Diff interpretation Negative for toxigenic C.  difficile  Final  Gastrointestinal Panel by PCR , Stool     Status: Abnormal   Collection Time: 06/17/15 10:03 AM  Result Value Ref Range Status   Campylobacter species DETECTED (A) NOT DETECTED Final    Comment: CRITICAL RESULT CALLED TO, READ BACK BY AND VERIFIED WITH: Dalene Carrow (RN) AT 1700 06/17/15 KLK.    Plesimonas shigelloides NOT DETECTED NOT DETECTED Final   Salmonella species NOT DETECTED NOT DETECTED Final   Yersinia enterocolitica NOT DETECTED NOT DETECTED Final   Vibrio species NOT DETECTED NOT DETECTED Final   Vibrio cholerae NOT DETECTED NOT DETECTED Final   Enteroaggregative E coli (EAEC) NOT DETECTED NOT DETECTED Final   Enteropathogenic E coli (EPEC) NOT DETECTED NOT DETECTED Final  Enterotoxigenic E coli (ETEC) NOT DETECTED NOT DETECTED Final   Shiga like toxin producing E coli (STEC) NOT DETECTED NOT DETECTED Final   E. coli O157 NOT DETECTED NOT DETECTED Final   Shigella/Enteroinvasive E coli (EIEC) NOT DETECTED NOT DETECTED Final   Cryptosporidium NOT DETECTED NOT DETECTED Final   Cyclospora cayetanensis NOT DETECTED NOT DETECTED Final   Entamoeba histolytica NOT DETECTED NOT DETECTED Final   Giardia lamblia NOT DETECTED NOT DETECTED Final   Adenovirus F40/41 NOT DETECTED NOT DETECTED Final   Astrovirus NOT DETECTED NOT DETECTED Final   Norovirus GI/GII NOT DETECTED NOT DETECTED Final   Rotavirus A NOT DETECTED NOT DETECTED Final   Sapovirus (I, II, IV, and V) NOT DETECTED NOT DETECTED Final     Studies: No results found.   Scheduled Meds: . [START ON 06/19/2015] azithromycin  500 mg Oral Daily  . escitalopram  10 mg Oral Daily  . [START ON 06/19/2015] famotidine  20 mg Oral Daily  . glipiZIDE  10 mg Oral Daily  . heparin  5,000 Units Subcutaneous Q8H  . insulin aspart  1-3 Units Subcutaneous Q4H  . [START ON 06/19/2015] levothyroxine  112 mcg Oral QAC breakfast  . metoprolol  2.5 mg Intravenous Q6H  . pioglitazone  15 mg Oral Daily   Continuous  Infusions: . dextrose 5 % and 0.9% NaCl 100 mL/hr at 06/18/15 0712   PRN Meds: acetaminophen **OR** acetaminophen, ALPRAZolam, diphenhydrAMINE **OR** diphenhydrAMINE, HYDROmorphone (DILAUDID) injection, ondansetron **OR** ondansetron (ZOFRAN) IV, polyvinyl alcohol  Time spent: 30 minutes  Author: Berle Mull, MD [facility] Pager: [phone] 06/18/2015 6:59 PM  If 7PM-7AM, please contact night-coverage at [email or web address], password [facility]

## 2015-06-18 NOTE — Progress Notes (Signed)
Inpatient Diabetes Program Recommendations  AACE/ADA: New Consensus Statement on Inpatient Glycemic Control (2015)  Target Ranges:  Prepandial:   less than 140 mg/dL      Peak postprandial:   less than 180 mg/dL (1-2 hours)      Critically ill patients:  140 - 180 mg/dL   Review of Glycemic Control  Diabetes history: DM 2 Outpatient Diabetes medications: Glipizide 5 mg Daily, Metformin 1,000 mg BID, Actos 15 mg Daily Current orders for Inpatient glycemic control: ICU Order set Novolog 1-3 units Q4hrs  Glucose range 170-190's.  Inpatient Diabetes Program Recommendations:   While on 6N and pt with diet order, please d/c ICU order set and order the regular Glycemic control order set with Novolog Sensitive Correction TID + HS coverage.  Thanks,  Tama Headings RN, MSN, [facility] Inpatient Diabetes Coordinator Team Pager [phone] (8a-5p)

## 2015-06-19 LAB — GLUCOSE, CAPILLARY
Glucose-Capillary: 207 mg/dL — ABNORMAL HIGH (ref 65–99)
Glucose-Capillary: 188 mg/dL — ABNORMAL HIGH (ref 65–99)
Glucose-Capillary: 136 mg/dL — ABNORMAL HIGH (ref 65–99)
Glucose-Capillary: 145 mg/dL — ABNORMAL HIGH (ref 65–99)
Glucose-Capillary: 144 mg/dL — ABNORMAL HIGH (ref 65–99)

## 2015-06-19 LAB — RENAL FUNCTION PANEL
ANION GAP: 10 (ref 5–15)
Albumin: 2.6 g/dL — ABNORMAL LOW (ref 3.5–5.0)
BUN: 19 mg/dL (ref 6–20)
CHLORIDE: 113 mmol/L — AB (ref 101–111)
CO2: 21 mmol/L — AB (ref 22–32)
CREATININE: 1.87 mg/dL — AB (ref 0.44–1.00)
Calcium: 7.7 mg/dL — ABNORMAL LOW (ref 8.9–10.3)
GFR, EST AFRICAN AMERICAN: 30 mL/min — AB (ref >60)
GFR, EST NON AFRICAN AMERICAN: 26 mL/min — AB (ref >60)
Glucose, Bld: 236 mg/dL — ABNORMAL HIGH (ref 65–99)
Phosphorus: 3 mg/dL (ref 2.5–4.6)
Potassium: 3.5 mmol/L (ref 3.5–5.1)
Sodium: 144 mmol/L (ref 135–145)

## 2015-06-19 MED ORDER — INSULIN ASPART 100 UNIT/ML ~~LOC~~ SOLN: 0.0000 [IU] | Freq: Every day | SUBCUTANEOUS | Status: DC

## 2015-06-19 MED ORDER — INSULIN ASPART 100 UNIT/ML ~~LOC~~ SOLN
0.0000 [IU] | Freq: Three times a day (TID) | SUBCUTANEOUS | Status: DC
  Administered 2015-06-19: 2 [IU] via SUBCUTANEOUS
  Administered 2015-06-19: 3 [IU] via SUBCUTANEOUS
  Administered 2015-06-19: 2 [IU] via SUBCUTANEOUS
  Administered 2015-06-20 – 2015-06-21 (×4): 3 [IU] via SUBCUTANEOUS
  Administered 2015-06-22: 2 [IU] via SUBCUTANEOUS

## 2015-06-19 MED ORDER — POTASSIUM CHLORIDE CRYS ER 10 MEQ PO TBCR
10.0000 meq | EXTENDED_RELEASE_TABLET | Freq: Two times a day (BID) | ORAL | Status: DC
  Administered 2015-06-19 – 2015-06-21 (×6): 10 meq via ORAL
  Filled 2015-06-19 (×7): qty 1

## 2015-06-19 NOTE — Evaluation (Signed)
Physical Therapy Evaluation & Discharge Patient Details Name: Monica Morrison MRN: [id number] DOB: [birth year] Today's Date: 06/19/2015   History of Present Illness  Monica Morrison is an 70 y.o. female with a history of Left breast Cancer, Osteopenia, CKD, Stage 3B, with BL Cr 1.5, DM, admitted on 06/15/2015 due to increased nausea vomiting and watery diarrhea, as well as decreased oral intake for 2-3 days prior to admission.  Found to have colitis with C diff negative/Positive for Campylobacter species and New liver lesion and bilateral adrenal masses.   Clinical Impression  Patient presents close to baseline level of mobility and, though needs to walk more for gastric/intestinal motility has no skilled PT needs at this time.  Encouraged pt to request nursing staff to assist her to walk in hallway several times a day and RN was informed.  Will sign off.     Follow Up Recommendations No PT follow up    Equipment Recommendations  None recommended by PT    Recommendations for Other Services       Precautions / Restrictions Precautions Precautions: Fall Precaution Comments: uses walker appropriately for fall prevention      Mobility  Bed Mobility               General bed mobility comments: up in chair  Transfers Overall transfer level: Modified independent                  Ambulation/Gait Ambulation/Gait assistance: Modified independent (Device/Increase time) Ambulation Distance (Feet): 800 Feet Assistive device: Rolling walker (2 wheeled) Gait Pattern/deviations: Step-through pattern;Decreased stride length;Trunk flexed     General Gait Details: appropriate use of walker in hallway, in room uses IV pole for taking herself to/from bathroom.    Stairs            Wheelchair Mobility    Modified Rankin (Stroke Patients Only)       Balance                                             Pertinent Vitals/Pain Pain Assessment:  0-10 Pain Score: 2  Pain Location: abdomen Pain Descriptors / Indicators: Aching Pain Intervention(s): Monitored during session    Home Living Family/patient expects to be discharged to:: Private residence Living Arrangements: Alone Available Help at Discharge: Family;Available PRN/intermittently Type of Home: House (townhome) Home Access: Level entry     Home Layout: One level Home Equipment: Walker - 4 wheels;Grab bars - tub/shower Additional Comments: Sister lives next door and they prepare meals together    Prior Function Level of Independence: Independent with assistive device(s)               Hand Dominance        Extremity/Trunk Assessment               Lower Extremity Assessment: Overall WFL for tasks assessed      Cervical / Trunk Assessment: Kyphotic  Communication   Communication: HOH  Cognition Arousal/Alertness: Awake/alert Behavior During Therapy: WFL for tasks assessed/performed Overall Cognitive Status: Within Functional Limits for tasks assessed                      General Comments      Exercises        Assessment/Plan    PT Assessment Patent does not need any further PT services  PT Diagnosis Generalized weakness   PT Problem List    PT Treatment Interventions     PT Goals (Current goals can be found in the Care Plan section) Acute Rehab PT Goals PT Goal Formulation: All assessment and education complete, DC therapy    Frequency     Barriers to discharge        Co-evaluation               End of Session   Activity Tolerance: Patient tolerated treatment well Patient left: Other (comment) (in bathroom, nursing aware) Nurse Communication: Mobility status (no skilled needs, but nursing staff can walk with pt)         Time: 1600-1620 PT Time Calculation (min) (ACUTE ONLY): 20 min   Charges:   PT Evaluation $PT Eval Low Complexity: 1 Procedure     PT G CodesReginia Morrison 07/03/15,  4:51 PM  Monica Morrison, Monica Morrison 07/03/2015

## 2015-06-19 NOTE — Progress Notes (Signed)
[facility] Consultation Progress Note  Patient: Monica Morrison [id number]   PCP: Gara Kroner, MD DOB: Sep 20, 1945   DOA: 06/15/2015   DOS: 06/19/2015   Date of Service: the patient was seen and examined on 06/19/2015 Primary service: Md Ccs, MD   Subjective: Patient only had one large bowel movement earlier this morning. No further diarrhea identified. Did have one episode of vomiting last night. No chest pain or shortness of breath. Adequate urination is identified. Nutrition: Tolerating oral diet Activity: Walking in the room currently independent Last BM: 06/18/2015  Assessment and Plan: 1. Campylobacter enteritis Patient initially presented with symptoms of small bowel obstruction was also found to be having antritis on the CT scan. Patient was started on Zosyn. Her GI pathogen panel is positive for Campylobacter. Given acute kidney injury liver elevation as well as recurrent persistent diarrhea I would treat her with azithromycin for 3 days. Or until her diarrhea resolves,Maximum 5 days.  2. Acute kidney injury, chronic kidney disease stage III. Renal function worsened from her baseline. Currently improving likely due to dehydration. Avoid nephrotoxic medication. Continue aggressive IV hydration. Adequate urine output.   3. Diabetes mellitus. Patient has been on Actos and glipizide which is continued to get in the hospital. Sugars are well controlled. Added sliding scale insulin. Monitor clinically.  4. SBO as well as antritis. Conservative management per surgery.  5. Hypothyroidism.  continue Synthroid.  6. Essential hypertension. Patient's medications have been switched to IV. Patient is on verapamil and Lopressor. Given that the patient has evidence of sinus bradycardia with first-degree heart block on telemetry I would recommend to discontinue verapamil and continue only Lopressor once transitioned to oral is initiated. Blood pressure  well-controlled.  Recommendation on discharge: outpatient nephrology referral. Complete azithromycin total of 5 days. Follow-up with PCP in one week with renal function. Discontinue verapamil continue Lopressor. Discontinue triamterene HCTZ.  Disposition: We will continue to follow the patient.  Barriers to safe discharge: Renal function stabilization  DVT Prophylaxis: subcutaneous Heparin Nutrition: Soft diet  Thank you very much for involving [place] in care of your patient.       Advance goals of care discussion: full code  Family Communication: family was present at bedside, at the time of interview. The pt provided permission to discuss medical plan with the family. Opportunity was given to ask question and all questions were answered satisfactorily.   Brief Summary of Hospitalization:  Daily update, Procedures: none Antibiotics: Anti-infectives    Start     Dose/Rate Route Frequency Ordered Stop   06/19/15 1000  azithromycin (ZITHROMAX) tablet 500 mg     500 mg Oral Daily 06/18/15 1356 06/21/15 0959   06/18/15 1130  azithromycin (ZITHROMAX) 500 mg in dextrose 5 % 250 mL IVPB  Status:  Discontinued     500 mg 250 mL/hr over 60 Minutes Intravenous Every 24 hours 06/18/15 1006 06/18/15 1355   06/15/15 1500  piperacillin-tazobactam (ZOSYN) IVPB 3.375 g  Status:  Discontinued     3.375 g 12.5 mL/hr over 240 Minutes Intravenous Every 8 hours 06/15/15 1453 06/17/15 0957      Intake/Output Summary (Last 24 hours) at 06/19/15 1754 Last data filed at 06/19/15 1700  Gross per 24 hour  Intake   4700 ml  Output   2200 ml  Net   2500 ml   Filed Weights   06/15/15 0601  Weight: 77.111 kg (170 lb)   Objective: Physical Exam: Filed Vitals:   06/18/15 1300 06/18/15 2117 06/19/15 0559  06/19/15 1351  BP: 121/65 132/66 129/63 148/69  Pulse: 86 83 80 71  Temp: 98 F (36.7 C) 98.7 F (37.1 C) 98 F (36.7 C) 97.9 F (36.6 C)  TempSrc: Oral Oral Oral Oral  Resp: 22 18 19 16    Height:      Weight:      SpO2: 100% 100% 100% 100%   General: Appear in mild distress, no Rash; Oral Mucosa moist. Cardiovascular: S1 and S2 Present, no Murmur, difficult to assess  JVD Respiratory: Bilateral Air entry present and Clear to Auscultation, no Crackles, no wheezes Abdomen: Bowel Sound present, Soft and mild tenderness Extremities: no Pedal edema, no calf tenderness  Data Reviewed: CBC:  Recent Labs Lab 06/15/15 0638 06/15/15 1656 06/16/15 0607 06/18/15 0453  WBC 10.9* 11.4* 8.1 6.9  NEUTROABS 9.3*  --   --   --   HGB 14.5 13.0 11.5* 11.4*  HCT 44.4 41.3 37.3 37.2  MCV 80.9 82.9 83.3 82.9  PLT 282 219 220 [id number]   Basic Metabolic Panel:  Recent Labs Lab 06/16/15 0607 06/16/15 1618 06/17/15 0525 06/18/15 0453 06/19/15 0404  NA 141 139 138 143 144  K 2.9* 3.0* 3.4* 4.0 3.5  CL 100* 102 105 111 113*  CO2 26 24 23  21* 21*  GLUCOSE 103* 183* 180* 175* 236*  BUN 28* 29* 27* 26* 19  CREATININE 1.72* 1.82* 2.03* 2.37* 1.87*  CALCIUM 8.2* 8.4* 7.7* 7.5* 7.7*  MG 1.6*  --  2.2  --   --   PHOS  --   --   --   --  3.0   Liver Function Tests:  Recent Labs Lab 06/15/15 0638 06/18/15 0453 06/19/15 0404  AST 26 289*  --   ALT 30 325*  --   ALKPHOS 162* 305*  --   BILITOT 0.8 0.6  --   PROT 7.9 5.7*  --   ALBUMIN 4.2 2.7* 2.6*    Recent Labs Lab 06/15/15 0638  LIPASE 20   No results for input(s): AMMONIA in the last 168 hours.  Cardiac Enzymes: No results for input(s): CKTOTAL, CKMB, CKMBINDEX, TROPONINI in the last 168 hours. BNP (last 3 results) No results for input(s): BNP in the last 8760 hours.  CBG:  Recent Labs Lab 06/18/15 2341 06/19/15 0418 06/19/15 0722 06/19/15 1313 06/19/15 1708  GLUCAP 223* 207* 188* 136* 145*   Recent Results (from the past 240 hour(s))  Urine culture     Status: None   Collection Time: 06/15/15  6:03 AM  Result Value Ref Range Status   Specimen Description URINE, CATHETERIZED  Final   Special Requests  NONE  Final   Culture MULTIPLE SPECIES PRESENT, SUGGEST RECOLLECTION  Final   Report Status 06/16/2015 FINAL  Final  C difficile quick scan w PCR reflex     Status: None   Collection Time: 06/17/15 10:03 AM  Result Value Ref Range Status   C Diff antigen NEGATIVE NEGATIVE Final   C Diff toxin NEGATIVE NEGATIVE Final   C Diff interpretation Negative for toxigenic C. difficile  Final  Gastrointestinal Panel by PCR , Stool     Status: Abnormal   Collection Time: 06/17/15 10:03 AM  Result Value Ref Range Status   Campylobacter species DETECTED (A) NOT DETECTED Final    Comment: CRITICAL RESULT CALLED TO, READ BACK BY AND VERIFIED WITH: Dalene Carrow (RN) AT 1700 06/17/15 KLK.    Plesimonas shigelloides NOT DETECTED NOT DETECTED Final   Salmonella species NOT  DETECTED NOT DETECTED Final   Yersinia enterocolitica NOT DETECTED NOT DETECTED Final   Vibrio species NOT DETECTED NOT DETECTED Final   Vibrio cholerae NOT DETECTED NOT DETECTED Final   Enteroaggregative E coli (EAEC) NOT DETECTED NOT DETECTED Final   Enteropathogenic E coli (EPEC) NOT DETECTED NOT DETECTED Final   Enterotoxigenic E coli (ETEC) NOT DETECTED NOT DETECTED Final   Shiga like toxin producing E coli (STEC) NOT DETECTED NOT DETECTED Final   E. coli O157 NOT DETECTED NOT DETECTED Final   Shigella/Enteroinvasive E coli (EIEC) NOT DETECTED NOT DETECTED Final   Cryptosporidium NOT DETECTED NOT DETECTED Final   Cyclospora cayetanensis NOT DETECTED NOT DETECTED Final   Entamoeba histolytica NOT DETECTED NOT DETECTED Final   Giardia lamblia NOT DETECTED NOT DETECTED Final   Adenovirus F40/41 NOT DETECTED NOT DETECTED Final   Astrovirus NOT DETECTED NOT DETECTED Final   Norovirus GI/GII NOT DETECTED NOT DETECTED Final   Rotavirus A NOT DETECTED NOT DETECTED Final   Sapovirus (I, II, IV, and V) NOT DETECTED NOT DETECTED Final   Studies: No results found.   Scheduled Meds: . azithromycin  500 mg Oral Daily  . escitalopram  10  mg Oral Daily  . famotidine  20 mg Oral Daily  . glipiZIDE  10 mg Oral Daily  . heparin  5,000 Units Subcutaneous Q8H  . insulin aspart  0-15 Units Subcutaneous TID WC  . insulin aspart  0-5 Units Subcutaneous QHS  . levothyroxine  112 mcg Oral QAC breakfast  . metoprolol  2.5 mg Intravenous Q6H  . pioglitazone  15 mg Oral Daily  . potassium chloride  10 mEq Oral BID PC   Continuous Infusions: . dextrose 5 % and 0.9% NaCl 100 mL/hr at 06/19/15 1052   PRN Meds: acetaminophen **OR** acetaminophen, ALPRAZolam, diphenhydrAMINE **OR** diphenhydrAMINE, HYDROmorphone (DILAUDID) injection, ondansetron **OR** ondansetron (ZOFRAN) IV, polyvinyl alcohol  Time spent: 30 minutes  Author: Berle Mull, MD [facility] Pager: [phone] 06/19/2015 5:54 PM  If 7PM-7AM, please contact night-coverage at [email or web address], password [facility]

## 2015-06-19 NOTE — Progress Notes (Signed)
  Subjective: She had some nausea and vomited last PM, seems better this AM, but has not eaten.  She is still having loose stools and said she felt full last PM. Abdomen is soft this AM, she is on bedside commode so I didn't do a very through exam.    Objective: Vital signs in last 24 hours: Temp:  [98 F (36.7 C)-98.7 F (37.1 C)] 98 F (36.7 C) (04/28 0559) Pulse Rate:  [79-86] 80 (04/28 0559) Resp:  [18-22] 19 (04/28 0559) BP: (121-145)/(63-88) 129/63 mmHg (04/28 0559) SpO2:  [100 %] 100 % (04/28 0559) Last BM Date: 06/18/15 480 PO BM x 4 Urine 2200 Afebrile, VSS Creatinine is down today Intake/Output from previous day: 04/27 0701 - 04/28 0700 In: 480 [P.O.:480] Out: 2200 [Urine:2200] Intake/Output this shift:    General appearance: alert, cooperative, no distress and on bedside commode, but upset she vomited again last PM Resp: clear to auscultation bilaterally and few rales in base GI: soft non tender this AM up to BS commode  Lab Results:   Recent Labs  06/18/15 0453  WBC 6.9  HGB 11.4*  HCT 37.2  PLT 191    BMET  Recent Labs  06/18/15 0453 06/19/15 0404  NA 143 144  K 4.0 3.5  CL 111 113*  CO2 21* 21*  GLUCOSE 175* 236*  BUN 26* 19  CREATININE 2.37* 1.87*  CALCIUM 7.5* 7.7*   PT/INR No results for input(s): LABPROT, INR in the last 72 hours.   Recent Labs Lab 06/15/15 0638 06/18/15 0453 06/19/15 0404  AST 26 289*  --   ALT 30 325*  --   ALKPHOS 162* 305*  --   BILITOT 0.8 0.6  --   PROT 7.9 5.7*  --   ALBUMIN 4.2 2.7* 2.6*     Lipase     Component Value Date/Time   LIPASE 20 06/15/2015 0638     Studies/Results: No results found.  Medications: . azithromycin  500 mg Oral Daily  . escitalopram  10 mg Oral Daily  . famotidine  20 mg Oral Daily  . glipiZIDE  10 mg Oral Daily  . heparin  5,000 Units Subcutaneous Q8H  . insulin aspart  1-3 Units Subcutaneous Q4H  . levothyroxine  112 mcg Oral QAC breakfast  . metoprolol  2.5  mg Intravenous Q6H  . pioglitazone  15 mg Oral Daily   . dextrose 5 % and 0.9% NaCl 100 mL/hr at 06/18/15 [id number]   Assessment/Plan SBO Colitis sigmoid and rectum  C diff negative/Positive for Campylobacter species New liver lesion and bilateral adrenal masses Cholelithiasis  Diverticulosis without diverticulitis Mild renal insuffiencey/dehydration Acute on chronic kidney disease  Hypokalemia/hypomagnesemia Hx of left breast cancer with mastectomy, chemotherapy and radiation therapy AODM Hypertension  Hypothyroid Arthritis  FEN: IV fluids/Carb MOD [id number] day 2 completed 06/16/15/ Azithromycin day 2 VTE: Heparin/SCD  Plan:  If she vomits again I will get a film.  Continue hydration and antibiotics.  I started some of her PO home meds yesterday.  I will let Dr Posey Pronto look at home meds and advance to more PO meds.    Recheck labs again in AM.         LOS: 4 days    Taveon Enyeart 06/19/2015 [phone]

## 2015-06-20 LAB — GLUCOSE, CAPILLARY
GLUCOSE-CAPILLARY: 183 mg/dL — AB (ref 65–99)
GLUCOSE-CAPILLARY: 179 mg/dL — AB (ref 65–99)
Glucose-Capillary: 166 mg/dL — ABNORMAL HIGH (ref 65–99)
Glucose-Capillary: 105 mg/dL — ABNORMAL HIGH (ref 65–99)
Glucose-Capillary: 168 mg/dL — ABNORMAL HIGH (ref 65–99)

## 2015-06-20 LAB — COMPREHENSIVE METABOLIC PANEL
ALBUMIN: 2.3 g/dL — AB (ref 3.5–5.0)
ALT: 184 U/L — ABNORMAL HIGH (ref 14–54)
AST: 72 U/L — AB (ref 15–41)
Alkaline Phosphatase: 296 U/L — ABNORMAL HIGH (ref 38–126)
Anion gap: 7 (ref 5–15)
BILIRUBIN TOTAL: 0.4 mg/dL (ref 0.3–1.2)
BUN: 9 mg/dL (ref 6–20)
CALCIUM: 7.5 mg/dL — AB (ref 8.9–10.3)
CO2: 19 mmol/L — ABNORMAL LOW (ref 22–32)
CREATININE: 1.17 mg/dL — AB (ref 0.44–1.00)
Chloride: 117 mmol/L — ABNORMAL HIGH (ref 101–111)
GFR calc Af Amer: 53 mL/min — ABNORMAL LOW (ref >60)
GFR calc non Af Amer: 46 mL/min — ABNORMAL LOW (ref >60)
GLUCOSE: 200 mg/dL — AB (ref 65–99)
POTASSIUM: 3.6 mmol/L (ref 3.5–5.1)
Sodium: 143 mmol/L (ref 135–145)
TOTAL PROTEIN: 5.1 g/dL — AB (ref 6.5–8.1)

## 2015-06-20 LAB — CBC
HEMATOCRIT: 33.1 % — AB (ref 36.0–46.0)
Hemoglobin: 10 g/dL — ABNORMAL LOW (ref 12.0–15.0)
MCH: 25.5 pg — ABNORMAL LOW (ref 26.0–34.0)
MCHC: 30.2 g/dL (ref 30.0–36.0)
MCV: 84.4 fL (ref 78.0–100.0)
PLATELETS: 172 10*3/uL (ref 150–400)
RBC: 3.92 MIL/uL (ref 3.87–5.11)
RDW: 18.4 % — AB (ref 11.5–15.5)
WBC: 8.1 10*3/uL (ref 4.0–10.5)

## 2015-06-20 MED ORDER — METOPROLOL TARTRATE 25 MG PO TABS
25.0000 mg | ORAL_TABLET | Freq: Two times a day (BID) | ORAL | Status: DC
  Administered 2015-06-20 – 2015-06-22 (×5): 25 mg via ORAL
  Filled 2015-06-20 (×5): qty 1

## 2015-06-20 MED ORDER — AZITHROMYCIN 250 MG PO TABS
500.0000 mg | ORAL_TABLET | Freq: Every day | ORAL | Status: AC
  Administered 2015-06-21 – 2015-06-22 (×2): 500 mg via ORAL
  Filled 2015-06-20 (×2): qty 2

## 2015-06-20 NOTE — Progress Notes (Signed)
  Subjective: She feels better but still cannot get to the toilet quick enough.  She had 5 Bm's yesterday, and 3 so far today.  No further nausea or vomiting.  Objective: Vital signs in last 24 hours: Temp:  [97.9 F (36.6 C)-98.2 F (36.8 C)] 97.9 F (36.6 C) (04/29 0618) Pulse Rate:  [71-78] 78 (04/29 0618) Resp:  [16-19] 19 (04/29 0618) BP: (118-155)/(56-75) 118/56 mmHg (04/29 0618) SpO2:  [99 %-100 %] 99 % (04/29 0618) Last BM Date: 06/18/15 1320 PO  3 stools Afebrile, VSS Creatinine is back down to 1.17 LFT's improving WBC remains normal Intake/Output from previous day: 04/28 0701 - 04/29 0700 In: 4700 [P.O.:1320; I.V.:3380] Out: 301 [Urine:300; Stool:1] Intake/Output this shift: Total I/O In: 300 [P.O.:300] Out: 2 [Stool:2]  General appearance: alert, cooperative and no distress Resp: clear to auscultation bilaterally GI: soft, non-tender; bowel sounds normal; no masses,  no organomegaly  Lab Results:   Recent Labs  06/18/15 0453 06/20/15 0542  WBC 6.9 8.1  HGB 11.4* 10.0*  HCT 37.2 33.1*  PLT 191 172    BMET  Recent Labs  06/19/15 0404 06/20/15 0542  NA 144 143  K 3.5 3.6  CL 113* 117*  CO2 21* 19*  GLUCOSE 236* 200*  BUN 19 9  CREATININE 1.87* 1.17*  CALCIUM 7.7* 7.5*   PT/INR No results for input(s): LABPROT, INR in the last 72 hours.   Recent Labs Lab 06/15/15 0638 06/18/15 0453 06/19/15 0404 06/20/15 0542  AST 26 289*  --  72*  ALT 30 325*  --  184*  ALKPHOS 162* 305*  --  296*  BILITOT 0.8 0.6  --  0.4  PROT 7.9 5.7*  --  5.1*  ALBUMIN 4.2 2.7* 2.6* 2.3*     Lipase     Component Value Date/Time   LIPASE 20 06/15/2015 0638     Studies/Results: No results found.  Medications: . [START ON 06/21/2015] azithromycin  500 mg Oral Daily  . escitalopram  10 mg Oral Daily  . famotidine  20 mg Oral Daily  . glipiZIDE  10 mg Oral Daily  . heparin  5,000 Units Subcutaneous Q8H  . insulin aspart  0-15 Units Subcutaneous TID  WC  . insulin aspart  0-5 Units Subcutaneous QHS  . levothyroxine  112 mcg Oral QAC breakfast  . metoprolol tartrate  25 mg Oral BID  . pioglitazone  15 mg Oral Daily  . potassium chloride  10 mEq Oral BID PC    Assessment/Plan SBO Colitis sigmoid and rectum  C diff negative/Positive for Campylobacter species New liver lesion and bilateral adrenal masses Cholelithiasis  Diverticulosis without diverticulitis Mild renal insuffiencey/dehydration Acute on chronic kidney disease  Hypokalemia/hypomagnesemia Hx of left breast cancer with mastectomy, chemotherapy and radiation therapy AODM Hypertension  Hypothyroid Arthritis  FEN: IV fluids/Carb MOD [id number] day 2 completed 06/16/15/ Azithromycin day 3 VTE: Heparin/SCD    Plan:  Discussed with Dr. Posey Pronto, continue IV hydration and Azithromycin.  Diarrhea still not controlled.  Liver and renal function improving.     LOS: 5 days    Monica Morrison 06/20/2015 [phone]

## 2015-06-20 NOTE — Progress Notes (Addendum)
[facility] Consultation Progress Note  Patient: Monica Morrison [id number]   PCP: Gara Kroner, MD DOB: April 12, 1945   DOA: 06/15/2015   DOS: 06/20/2015   Date of Service: the patient was seen and examined on 06/20/2015 Primary service: Md Ccs, MD   Subjective: Patient did not have any further episode of vomiting. No nausea and tolerating oral diet. Patient had 2 bowel movement yesterday. Nutrition: Tolerating oral diet Activity: Walking in the room currently independent Last BM: 06/20/2015  Assessment and Plan: 1. Campylobacter enteritis Patient initially presented with symptoms of small bowel obstruction was also found to be having antritis on the CT scan. Patient was started on Zosyn. Her GI pathogen panel is positive for Campylobacter. Given acute kidney injury liver elevation as well as recurrent persistent diarrhea I would treat her with azithromycin for 5 days.  2. Acute kidney injury, chronic kidney disease stage III. Renal function worsened from her baseline. Currently improving likely due to dehydration. Avoid nephrotoxic medication. Continue IV hydration. Adequate urine output.   3. Diabetes mellitus. Patient has been on Actos and glipizide which is continued to get in the hospital. Sugars are well controlled. Added sliding scale insulin. Monitor clinically.  4. SBO as well as antritis. Conservative management per surgery.  5. Hypothyroidism.  continue Synthroid.  6. Elevated LFT. Liver mass. Patient's LFTs are elevated at present and they are trending downwards. Likely in the setting of dehydration and organ damage from diarrhea which is also caused acute kidney injury. At present would avoid hepatotoxic medication and monitor. Recheck LFT with PCP in one week. Patient has also a liver mass suspicious for possible malignancy or metastasis. Patient will need MRI of the liver ideally as an outpatient once her LFTs stabilizes.  7. Essential  hypertension. Tolerating 25 mg Lopressor. Patient is on verapamil and Lopressor. Given that the patient has evidence of sinus bradycardia with first-degree heart block on telemetry I would recommend to discontinue verapamil and continue only Lopressor Blood pressure well-controlled.  Recommendation on discharge: outpatient nephrology referral. Complete azithromycin total of 5 days. Follow-up with PCP in one week with renal and liver function.  Outpatient MRI liver once liver function stabilizes. Discontinue verapamil continue Lopressor. Discontinue triamterene HCTZ.  Disposition: We will continue to follow the patient.  Barriers to safe discharge: Renal function stabilization, diarrhea resolution  DVT Prophylaxis: subcutaneous Heparin Nutrition: Soft diet  Thank you very much for involving [place] in care of your patient.       Advance goals of care discussion: full code  Family Communication: no family was present at bedside, at the time of interview.   Brief Summary of Hospitalization:  Daily update, Procedures: none Antibiotics: Anti-infectives    Start     Dose/Rate Route Frequency Ordered Stop   06/21/15 1000  azithromycin (ZITHROMAX) tablet 500 mg     500 mg Oral Daily 06/20/15 1001 06/23/15 0959   06/19/15 1000  azithromycin (ZITHROMAX) tablet 500 mg     500 mg Oral Daily 06/18/15 1356 06/20/15 0958   06/18/15 1130  azithromycin (ZITHROMAX) 500 mg in dextrose 5 % 250 mL IVPB  Status:  Discontinued     500 mg 250 mL/hr over 60 Minutes Intravenous Every 24 hours 06/18/15 1006 06/18/15 1355   06/15/15 1500  piperacillin-tazobactam (ZOSYN) IVPB 3.375 g  Status:  Discontinued     3.375 g 12.5 mL/hr over 240 Minutes Intravenous Every 8 hours 06/15/15 1453 06/17/15 0957      Intake/Output Summary (Last 24 hours) at  06/20/15 1844 Last data filed at 06/20/15 1844  Gross per 24 hour  Intake    940 ml  Output    506 ml  Net    434 ml   Filed Weights   06/15/15 0601   Weight: 77.111 kg (170 lb)   Objective: Physical Exam: Filed Vitals:   06/19/15 1351 06/19/15 2021 06/20/15 0618 06/20/15 1452  BP: 148/69 155/75 118/56 147/74  Pulse: 71 73 78 74  Temp: 97.9 F (36.6 C) 98.2 F (36.8 C) 97.9 F (36.6 C) 98 F (36.7 C)  TempSrc: Oral Oral Oral Oral  Resp: 16 17 19 18   Height:      Weight:      SpO2: 100% 100% 99% 100%   General: Appear in mild distress, no Rash; Oral Mucosa moist. Cardiovascular: S1 and S2 Present, no Murmur, difficult to assess  JVD Respiratory: Bilateral Air entry present and Clear to Auscultation, no Crackles, no wheezes Abdomen: Bowel Sound present, Soft and no tenderness Extremities: no Pedal edema, no calf tenderness  Data Reviewed: CBC:  Recent Labs Lab 06/15/15 0638 06/15/15 1656 06/16/15 0607 06/18/15 0453 06/20/15 0542  WBC 10.9* 11.4* 8.1 6.9 8.1  NEUTROABS 9.3*  --   --   --   --   HGB 14.5 13.0 11.5* 11.4* 10.0*  HCT 44.4 41.3 37.3 37.2 33.1*  MCV 80.9 82.9 83.3 82.9 84.4  PLT 282 219 220 191 [id number]   Basic Metabolic Panel:  Recent Labs Lab 06/16/15 0607 06/16/15 1618 06/17/15 0525 06/18/15 0453 06/19/15 0404 06/20/15 0542  NA 141 139 138 143 144 143  K 2.9* 3.0* 3.4* 4.0 3.5 3.6  CL 100* 102 105 111 113* 117*  CO2 26 24 23  21* 21* 19*  GLUCOSE 103* 183* 180* 175* 236* 200*  BUN 28* 29* 27* 26* 19 9  CREATININE 1.72* 1.82* 2.03* 2.37* 1.87* 1.17*  CALCIUM 8.2* 8.4* 7.7* 7.5* 7.7* 7.5*  MG 1.6*  --  2.2  --   --   --   PHOS  --   --   --   --  3.0  --    Liver Function Tests:  Recent Labs Lab 06/15/15 0638 06/18/15 0453 06/19/15 0404 06/20/15 0542  AST 26 289*  --  72*  ALT 30 325*  --  184*  ALKPHOS 162* 305*  --  296*  BILITOT 0.8 0.6  --  0.4  PROT 7.9 5.7*  --  5.1*  ALBUMIN 4.2 2.7* 2.6* 2.3*    Recent Labs Lab 06/15/15 0638  LIPASE 20   No results for input(s): AMMONIA in the last 168 hours.  Cardiac Enzymes: No results for input(s): CKTOTAL, CKMB, CKMBINDEX,  TROPONINI in the last 168 hours. BNP (last 3 results) No results for input(s): BNP in the last 8760 hours.  CBG:  Recent Labs Lab 06/19/15 2027 06/20/15 0021 06/20/15 0747 06/20/15 1118 06/20/15 1647  GLUCAP 144* 166* 183* 179* 105*   Recent Results (from the past 240 hour(s))  Urine culture     Status: None   Collection Time: 06/15/15  6:03 AM  Result Value Ref Range Status   Specimen Description URINE, CATHETERIZED  Final   Special Requests NONE  Final   Culture MULTIPLE SPECIES PRESENT, SUGGEST RECOLLECTION  Final   Report Status 06/16/2015 FINAL  Final  C difficile quick scan w PCR reflex     Status: None   Collection Time: 06/17/15 10:03 AM  Result Value Ref Range Status   C  Diff antigen NEGATIVE NEGATIVE Final   C Diff toxin NEGATIVE NEGATIVE Final   C Diff interpretation Negative for toxigenic C. difficile  Final  Gastrointestinal Panel by PCR , Stool     Status: Abnormal   Collection Time: 06/17/15 10:03 AM  Result Value Ref Range Status   Campylobacter species DETECTED (A) NOT DETECTED Final    Comment: CRITICAL RESULT CALLED TO, READ BACK BY AND VERIFIED WITH: Dalene Carrow (RN) AT 1700 06/17/15 KLK.    Plesimonas shigelloides NOT DETECTED NOT DETECTED Final   Salmonella species NOT DETECTED NOT DETECTED Final   Yersinia enterocolitica NOT DETECTED NOT DETECTED Final   Vibrio species NOT DETECTED NOT DETECTED Final   Vibrio cholerae NOT DETECTED NOT DETECTED Final   Enteroaggregative E coli (EAEC) NOT DETECTED NOT DETECTED Final   Enteropathogenic E coli (EPEC) NOT DETECTED NOT DETECTED Final   Enterotoxigenic E coli (ETEC) NOT DETECTED NOT DETECTED Final   Shiga like toxin producing E coli (STEC) NOT DETECTED NOT DETECTED Final   E. coli O157 NOT DETECTED NOT DETECTED Final   Shigella/Enteroinvasive E coli (EIEC) NOT DETECTED NOT DETECTED Final   Cryptosporidium NOT DETECTED NOT DETECTED Final   Cyclospora cayetanensis NOT DETECTED NOT DETECTED Final    Entamoeba histolytica NOT DETECTED NOT DETECTED Final   Giardia lamblia NOT DETECTED NOT DETECTED Final   Adenovirus F40/41 NOT DETECTED NOT DETECTED Final   Astrovirus NOT DETECTED NOT DETECTED Final   Norovirus GI/GII NOT DETECTED NOT DETECTED Final   Rotavirus A NOT DETECTED NOT DETECTED Final   Sapovirus (I, II, IV, and V) NOT DETECTED NOT DETECTED Final   Studies: No results found.   Scheduled Meds: . [START ON 06/21/2015] azithromycin  500 mg Oral Daily  . escitalopram  10 mg Oral Daily  . famotidine  20 mg Oral Daily  . glipiZIDE  10 mg Oral Daily  . heparin  5,000 Units Subcutaneous Q8H  . insulin aspart  0-15 Units Subcutaneous TID WC  . insulin aspart  0-5 Units Subcutaneous QHS  . levothyroxine  112 mcg Oral QAC breakfast  . metoprolol tartrate  25 mg Oral BID  . pioglitazone  15 mg Oral Daily  . potassium chloride  10 mEq Oral BID PC   Continuous Infusions: . dextrose 5 % and 0.9% NaCl 100 mL/hr at 06/20/15 1623   PRN Meds: acetaminophen **OR** acetaminophen, ALPRAZolam, diphenhydrAMINE **OR** diphenhydrAMINE, HYDROmorphone (DILAUDID) injection, ondansetron **OR** ondansetron (ZOFRAN) IV, polyvinyl alcohol  Time spent: 30 minutes  Author: Berle Mull, MD [facility] Pager: [phone] 06/20/2015 6:44 PM  If 7PM-7AM, please contact night-coverage at [email or web address], password [facility]

## 2015-06-21 LAB — COMPREHENSIVE METABOLIC PANEL
ALBUMIN: 2.4 g/dL — AB (ref 3.5–5.0)
ALT: 143 U/L — ABNORMAL HIGH (ref 14–54)
ANION GAP: 9 (ref 5–15)
AST: 48 U/L — AB (ref 15–41)
Alkaline Phosphatase: 272 U/L — ABNORMAL HIGH (ref 38–126)
BUN: 9 mg/dL (ref 6–20)
CHLORIDE: 118 mmol/L — AB (ref 101–111)
CO2: 15 mmol/L — AB (ref 22–32)
Calcium: 8.2 mg/dL — ABNORMAL LOW (ref 8.9–10.3)
Creatinine, Ser: 1.12 mg/dL — ABNORMAL HIGH (ref 0.44–1.00)
GFR calc Af Amer: 56 mL/min — ABNORMAL LOW (ref >60)
GFR calc non Af Amer: 49 mL/min — ABNORMAL LOW (ref >60)
GLUCOSE: 189 mg/dL — AB (ref 65–99)
POTASSIUM: 4.7 mmol/L (ref 3.5–5.1)
SODIUM: 142 mmol/L (ref 135–145)
Total Bilirubin: 0.7 mg/dL (ref 0.3–1.2)
Total Protein: 5.4 g/dL — ABNORMAL LOW (ref 6.5–8.1)

## 2015-06-21 LAB — GLUCOSE, CAPILLARY
GLUCOSE-CAPILLARY: 167 mg/dL — AB (ref 65–99)
GLUCOSE-CAPILLARY: 85 mg/dL (ref 65–99)
Glucose-Capillary: 169 mg/dL — ABNORMAL HIGH (ref 65–99)
Glucose-Capillary: 108 mg/dL — ABNORMAL HIGH (ref 65–99)

## 2015-06-21 LAB — TSH: TSH: 8.492 u[IU]/mL — ABNORMAL HIGH (ref 0.350–4.500)

## 2015-06-21 LAB — T4, FREE: Free T4: 0.98 ng/dL (ref 0.61–1.12)

## 2015-06-21 MED ORDER — SACCHAROMYCES BOULARDII 250 MG PO CAPS
250.0000 mg | ORAL_CAPSULE | Freq: Two times a day (BID) | ORAL | Status: DC
Start: 2015-06-21 — End: 2015-06-22
  Administered 2015-06-21 – 2015-06-22 (×3): 250 mg via ORAL
  Filled 2015-06-21 (×3): qty 1

## 2015-06-21 MED ORDER — BISMUTH SUBSALICYLATE 262 MG/15ML PO SUSP
30.0000 mL | ORAL | Status: DC | PRN
  Administered 2015-06-21 (×3): 30 mL via ORAL
  Filled 2015-06-21: qty 118

## 2015-06-21 NOTE — Progress Notes (Signed)
[facility] Consultation Progress Note  Patient: Monica Morrison [id number]   PCP: Gara Kroner, MD DOB: 03/05/1945   DOA: 06/15/2015   DOS: 06/21/2015   Date of Service: the patient was seen and examined on 06/21/2015 Primary service: Md Ccs, MD   Subjective: Patient continues to have significant diarrhea overnight. No nausea no vomiting. Now has developed leg swelling. Nutrition: Tolerating oral diet Activity: Walking in the room currently independent Last BM: 06/21/2015  Assessment and Plan: 1. Campylobacter enteritis Patient initially presented with symptoms of small bowel obstruction was also found to be having antritis on the CT scan. Patient was started on Zosyn. Her GI pathogen panel is positive for Campylobacter. Given acute kidney injury liver elevation as well as recurrent persistent diarrhea I would treat her with azithromycin for 5 days. Unsafe to discharge the patient with ongoing diarrhea with renal function worsening For diarrhea I would add Pepto-Bismol as well as probiotics.  2. Acute kidney injury, chronic kidney disease stage III. Renal function worsened from her baseline. Currently improving likely due to dehydration. Avoid nephrotoxic medication. Given the patient has developed leg swelling I would hold IV hydration. Encourage by mouth hydration Adequate urine output.   3. Diabetes mellitus. Patient has been on Actos and glipizide which is continued to get in the hospital. Sugars are well controlled. Added sliding scale insulin. Monitor clinically.  4. SBO as well as antritis. Conservative management per surgery.  5. Hypothyroidism.  continue Synthroid. TSH significantly elevated but free T4 is normal. Given that the patient is actually having diarrhea I would hold off on increasing the dose of the Synthroid at present. Recommend to recheck in 1 week and adjust the dose of the Synthroid.  6. Elevated LFT. Liver mass. Patient's LFTs are  elevated at present and they are trending downwards. Likely in the setting of dehydration and organ damage from diarrhea which is also caused acute kidney injury. At present would avoid hepatotoxic medication and monitor. Recheck LFT with PCP in one week. Patient has also a liver mass suspicious for possible malignancy or metastasis. Patient will need MRI of the liver ideally as an outpatient once her LFTs stabilizes.  7. Essential hypertension. Tolerating 25 mg Lopressor. Patient is on verapamil and Lopressor. Given that the patient has evidence of sinus bradycardia with first-degree heart block on telemetry I would recommend to discontinue verapamil and continue only Lopressor Blood pressure well-controlled.  Recommendation on discharge: outpatient nephrology referral. Complete azithromycin total of 5 days. Follow-up with PCP in one week with renal and liver function.  Outpatient MRI liver once liver function stabilizes. Discontinue verapamil continue Lopressor. Discontinue triamterene HCTZ. Check thyroid level in one week  Disposition: We will continue to follow the patient.  Barriers to safe discharge: Renal function stabilization, diarrhea resolution  DVT Prophylaxis: subcutaneous Heparin, ted stocking  Nutrition: Soft diet  Thank you very much for involving [place] in care of your patient.       Advance goals of care discussion: full code  Family Communication: no family was present at bedside, at the time of interview.   Brief Summary of Hospitalization:  Daily update, Procedures: none Antibiotics: Anti-infectives    Start     Dose/Rate Route Frequency Ordered Stop   06/21/15 1000  azithromycin (ZITHROMAX) tablet 500 mg     500 mg Oral Daily 06/20/15 1001 06/23/15 0959   06/19/15 1000  azithromycin (ZITHROMAX) tablet 500 mg     500 mg Oral Daily 06/18/15 1356 06/20/15 0958  06/18/15 1130  azithromycin (ZITHROMAX) 500 mg in dextrose 5 % 250 mL IVPB  Status:  Discontinued      500 mg 250 mL/hr over 60 Minutes Intravenous Every 24 hours 06/18/15 1006 06/18/15 1355   06/15/15 1500  piperacillin-tazobactam (ZOSYN) IVPB 3.375 g  Status:  Discontinued     3.375 g 12.5 mL/hr over 240 Minutes Intravenous Every 8 hours 06/15/15 1453 06/17/15 0957      Intake/Output Summary (Last 24 hours) at 06/21/15 1933 Last data filed at 06/21/15 1440  Gross per 24 hour  Intake   4360 ml  Output   1275 ml  Net   3085 ml   Filed Weights   06/15/15 0601  Weight: 77.111 kg (170 lb)   Objective: Physical Exam: Filed Vitals:   06/20/15 1452 06/20/15 2023 06/21/15 0543 06/21/15 1440  BP: 147/74 146/78 114/54 141/66  Pulse: 74 75 72 68  Temp: 98 F (36.7 C) 98.2 F (36.8 C) 98.4 F (36.9 C) 98.6 F (37 C)  TempSrc: Oral Oral Oral Oral  Resp: 18 16 16 18   Height:      Weight:      SpO2: 100% 100% 99% 98%   General: Appear in mild distress, no Rash; Oral Mucosa moist. Cardiovascular: S1 and S2 Present, no Murmur, difficult to assess  JVD Respiratory: Bilateral Air entry present and Clear to Auscultation, no Crackles, no wheezes Abdomen: Bowel Sound present, Soft and no tenderness Extremities: no Pedal edema, no calf tenderness  Data Reviewed: CBC:  Recent Labs Lab 06/15/15 0638 06/15/15 1656 06/16/15 0607 06/18/15 0453 06/20/15 0542  WBC 10.9* 11.4* 8.1 6.9 8.1  NEUTROABS 9.3*  --   --   --   --   HGB 14.5 13.0 11.5* 11.4* 10.0*  HCT 44.4 41.3 37.3 37.2 33.1*  MCV 80.9 82.9 83.3 82.9 84.4  PLT 282 219 220 191 [id number]   Basic Metabolic Panel:  Recent Labs Lab 06/16/15 0607  06/17/15 0525 06/18/15 0453 06/19/15 0404 06/20/15 0542 06/21/15 0524  NA 141  < > 138 143 144 143 142  K 2.9*  < > 3.4* 4.0 3.5 3.6 4.7  CL 100*  < > 105 111 113* 117* 118*  CO2 26  < > 23 21* 21* 19* 15*  GLUCOSE 103*  < > 180* 175* 236* 200* 189*  BUN 28*  < > 27* 26* 19 9 9   CREATININE 1.72*  < > 2.03* 2.37* 1.87* 1.17* 1.12*  CALCIUM 8.2*  < > 7.7* 7.5* 7.7* 7.5* 8.2*    MG 1.6*  --  2.2  --   --   --   --   PHOS  --   --   --   --  3.0  --   --   < > = values in this interval not displayed. Liver Function Tests:  Recent Labs Lab 06/15/15 [id number] 06/18/15 0453 06/19/15 0404 06/20/15 0542 06/21/15 0524  AST 26 289*  --  72* 48*  ALT 30 325*  --  184* 143*  ALKPHOS 162* 305*  --  296* 272*  BILITOT 0.8 0.6  --  0.4 0.7  PROT 7.9 5.7*  --  5.1* 5.4*  ALBUMIN 4.2 2.7* 2.6* 2.3* 2.4*    Recent Labs Lab 06/15/15 [id number]  LIPASE 20   No results for input(s): AMMONIA in the last 168 hours.  Cardiac Enzymes: No results for input(s): CKTOTAL, CKMB, CKMBINDEX, TROPONINI in the last 168 hours. BNP (last 3 results) No results  for input(s): BNP in the last 8760 hours.  CBG:  Recent Labs Lab 06/20/15 1118 06/20/15 1647 06/20/15 2150 06/21/15 0747 06/21/15 1141  GLUCAP 179* 105* 168* 169* 167*   Recent Results (from the past 240 hour(s))  Urine culture     Status: None   Collection Time: 06/15/15  6:03 AM  Result Value Ref Range Status   Specimen Description URINE, CATHETERIZED  Final   Special Requests NONE  Final   Culture MULTIPLE SPECIES PRESENT, SUGGEST RECOLLECTION  Final   Report Status 06/16/2015 FINAL  Final  C difficile quick scan w PCR reflex     Status: None   Collection Time: 06/17/15 10:03 AM  Result Value Ref Range Status   C Diff antigen NEGATIVE NEGATIVE Final   C Diff toxin NEGATIVE NEGATIVE Final   C Diff interpretation Negative for toxigenic C. difficile  Final  Gastrointestinal Panel by PCR , Stool     Status: Abnormal   Collection Time: 06/17/15 10:03 AM  Result Value Ref Range Status   Campylobacter species DETECTED (A) NOT DETECTED Final    Comment: CRITICAL RESULT CALLED TO, READ BACK BY AND VERIFIED WITH: Dalene Carrow (RN) AT 1700 06/17/15 KLK.    Plesimonas shigelloides NOT DETECTED NOT DETECTED Final   Salmonella species NOT DETECTED NOT DETECTED Final   Yersinia enterocolitica NOT DETECTED NOT DETECTED Final    Vibrio species NOT DETECTED NOT DETECTED Final   Vibrio cholerae NOT DETECTED NOT DETECTED Final   Enteroaggregative E coli (EAEC) NOT DETECTED NOT DETECTED Final   Enteropathogenic E coli (EPEC) NOT DETECTED NOT DETECTED Final   Enterotoxigenic E coli (ETEC) NOT DETECTED NOT DETECTED Final   Shiga like toxin producing E coli (STEC) NOT DETECTED NOT DETECTED Final   E. coli O157 NOT DETECTED NOT DETECTED Final   Shigella/Enteroinvasive E coli (EIEC) NOT DETECTED NOT DETECTED Final   Cryptosporidium NOT DETECTED NOT DETECTED Final   Cyclospora cayetanensis NOT DETECTED NOT DETECTED Final   Entamoeba histolytica NOT DETECTED NOT DETECTED Final   Giardia lamblia NOT DETECTED NOT DETECTED Final   Adenovirus F40/41 NOT DETECTED NOT DETECTED Final   Astrovirus NOT DETECTED NOT DETECTED Final   Norovirus GI/GII NOT DETECTED NOT DETECTED Final   Rotavirus A NOT DETECTED NOT DETECTED Final   Sapovirus (I, II, IV, and V) NOT DETECTED NOT DETECTED Final   Studies: No results found.   Scheduled Meds: . azithromycin  500 mg Oral Daily  . escitalopram  10 mg Oral Daily  . famotidine  20 mg Oral Daily  . glipiZIDE  10 mg Oral Daily  . heparin  5,000 Units Subcutaneous Q8H  . insulin aspart  0-15 Units Subcutaneous TID WC  . insulin aspart  0-5 Units Subcutaneous QHS  . levothyroxine  112 mcg Oral QAC breakfast  . metoprolol tartrate  25 mg Oral BID  . pioglitazone  15 mg Oral Daily  . potassium chloride  10 mEq Oral BID PC  . saccharomyces boulardii  250 mg Oral BID   Continuous Infusions:   PRN Meds: acetaminophen **OR** acetaminophen, ALPRAZolam, bismuth subsalicylate, diphenhydrAMINE **OR** diphenhydrAMINE, HYDROmorphone (DILAUDID) injection, ondansetron **OR** ondansetron (ZOFRAN) IV, polyvinyl alcohol  Time spent: 30 minutes  Author: Berle Mull, MD [facility] Pager: [phone] 06/21/2015 7:33 PM  If 7PM-7AM, please contact night-coverage at [email or web address], password [facility]

## 2015-06-21 NOTE — Progress Notes (Signed)
  Subjective: She continues to have multiple loose stools, she says aide has had to clean her up a couple times this Am already.  She is doing well with her diet, no abdominal pain issues.  Objective: Vital signs in last 24 hours: Temp:  [98 F (36.7 C)-98.4 F (36.9 C)] 98.4 F (36.9 C) (04/30 0543) Pulse Rate:  [72-75] 72 (04/30 0543) Resp:  [16-18] 16 (04/30 0543) BP: (114-147)/(54-78) 114/54 mmHg (04/30 0543) SpO2:  [99 %-100 %] 99 % (04/30 0543) Last BM Date: 06/20/15 940 PO. Stool x 5 yesterday recorded, pt notes more and still very soft. Urine 1775 Afebrile, VSS cmp continue to improve. Intake/Output from previous day: 04/29 0701 - 04/30 0700 In: 4040 [P.O.:940; I.V.:3100] Out: 1780 [Urine:1775; Stool:5] Intake/Output this shift: Total I/O In: 780 [I.V.:780] Out: -   General appearance: alert, cooperative and no distress Resp: clear to auscultation bilaterally GI: soft, non-tender; bowel sounds normal; no masses,  no organomegaly and multiple soft stools, ongoing issue.  Lab Results:   Recent Labs  06/20/15 0542  WBC 8.1  HGB 10.0*  HCT 33.1*  PLT 172    BMET  Recent Labs  06/20/15 0542 06/21/15 0524  NA 143 142  K 3.6 4.7  CL 117* 118*  CO2 19* 15*  GLUCOSE 200* 189*  BUN 9 9  CREATININE 1.17* 1.12*  CALCIUM 7.5* 8.2*   PT/INR No results for input(s): LABPROT, INR in the last 72 hours.   Recent Labs Lab 06/15/15 0638 06/18/15 0453 06/19/15 0404 06/20/15 0542 06/21/15 0524  AST 26 289*  --  72* 48*  ALT 30 325*  --  184* 143*  ALKPHOS 162* 305*  --  296* 272*  BILITOT 0.8 0.6  --  0.4 0.7  PROT 7.9 5.7*  --  5.1* 5.4*  ALBUMIN 4.2 2.7* 2.6* 2.3* 2.4*     Lipase     Component Value Date/Time   LIPASE 20 06/15/2015 0638     Studies/Results: No results found.  Medications: . azithromycin  500 mg Oral Daily  . escitalopram  10 mg Oral Daily  . famotidine  20 mg Oral Daily  . glipiZIDE  10 mg Oral Daily  . heparin  5,000  Units Subcutaneous Q8H  . insulin aspart  0-15 Units Subcutaneous TID WC  . insulin aspart  0-5 Units Subcutaneous QHS  . levothyroxine  112 mcg Oral QAC breakfast  . metoprolol tartrate  25 mg Oral BID  . pioglitazone  15 mg Oral Daily  . potassium chloride  10 mEq Oral BID PC    Assessment/Plan SBO Colitis sigmoid and rectum  C diff negative/Positive for Campylobacter species New liver lesion and bilateral adrenal masses Cholelithiasis  Diverticulosis without diverticulitis Mild renal insuffiencey/dehydration Acute on chronic kidney disease  Hypokalemia/hypomagnesemia Hx of left breast cancer with mastectomy, chemotherapy and radiation therapy AODM Hypertension  Hypothyroid Arthritis  FEN: IV fluids/Carb MOD [id number] day 2 completed 06/16/15/ Azithromycin day 4/5 VTE: Heparin/SCD    Plan:  I will discuss with Dr. Posey Pronto. Her renal failure and liver funtions continue to improve, but I'm hesitant to send her home with ongoing diarrhea.  Today is day 4/5 of Azithromycin.    LOS: 6 days    Nakkia Mackiewicz 06/21/2015 [phone]

## 2015-06-22 ENCOUNTER — Encounter (HOSPITAL_COMMUNITY): Payer: Self-pay | Admitting: General Surgery

## 2015-06-22 DIAGNOSIS — E039 Hypothyroidism, unspecified: Secondary | ICD-10-CM

## 2015-06-22 HISTORY — DX: Hypothyroidism, unspecified: E03.9

## 2015-06-22 LAB — COMPREHENSIVE METABOLIC PANEL
ALT: 112 U/L — AB (ref 14–54)
AST: 31 U/L (ref 15–41)
Albumin: 2.5 g/dL — ABNORMAL LOW (ref 3.5–5.0)
Alkaline Phosphatase: 228 U/L — ABNORMAL HIGH (ref 38–126)
Anion gap: 8 (ref 5–15)
BILIRUBIN TOTAL: 0.3 mg/dL (ref 0.3–1.2)
BUN: 10 mg/dL (ref 6–20)
CO2: 19 mmol/L — ABNORMAL LOW (ref 22–32)
CREATININE: 1.23 mg/dL — AB (ref 0.44–1.00)
Calcium: 8.7 mg/dL — ABNORMAL LOW (ref 8.9–10.3)
Chloride: 115 mmol/L — ABNORMAL HIGH (ref 101–111)
GFR calc Af Amer: 50 mL/min — ABNORMAL LOW (ref >60)
GFR, EST NON AFRICAN AMERICAN: 43 mL/min — AB (ref >60)
Glucose, Bld: 129 mg/dL — ABNORMAL HIGH (ref 65–99)
POTASSIUM: 3.9 mmol/L (ref 3.5–5.1)
Sodium: 142 mmol/L (ref 135–145)
TOTAL PROTEIN: 5.7 g/dL — AB (ref 6.5–8.1)

## 2015-06-22 LAB — GLUCOSE, CAPILLARY: Glucose-Capillary: 128 mg/dL — ABNORMAL HIGH (ref 65–99)

## 2015-06-22 MED ORDER — METOPROLOL TARTRATE 25 MG PO TABS: 25.0000 mg | ORAL_TABLET | Freq: Two times a day (BID) | ORAL | Status: DC

## 2015-06-22 MED ORDER — BISMUTH SUBSALICYLATE 262 MG/15ML PO SUSP: ORAL | Status: DC

## 2015-06-22 MED ORDER — SACCHAROMYCES BOULARDII 250 MG PO CAPS: ORAL_CAPSULE | ORAL | Status: DC

## 2015-06-22 NOTE — Progress Notes (Signed)
[facility] Consultation Progress Note  Patient: Monica Morrison [id number]   PCP: Gara Kroner, MD DOB: [birth year]   DOA: 06/15/2015   DOS: 06/22/2015   Date of Service: the patient was seen and examined on 06/22/2015 Primary service: Md Ccs, MD   Subjective: Patient has reportedly 4 episodes of diarrhea although on further inquiring she has minimal stool amount. She denies any abdominal pain no nausea no vomiting tolerating oral diet. Nutrition: Tolerating oral diet Activity: Walking in the room currently independent Last BM: 06/22/2015  Assessment and Plan: 1. Campylobacter enteritis Patient initially presented with symptoms of small bowel obstruction was also found to be having antritis on the CT scan. Patient was started on Zosyn. Her GI pathogen panel is positive for Campylobacter. Given acute kidney injury liver elevation as well as recurrent persistent diarrhea I would treat her with azithromycin for 5 days. For diarrhea I would add Pepto-Bismol as well as probiotics.  2. Acute kidney injury, chronic kidney disease stage III. Renal function worsened from her baseline. Currently improving likely due to dehydration. Avoid nephrotoxic medication. Adequate urine output.   3. Diabetes mellitus. Patient has been on Actos and glipizide which is continued to get in the hospital. Sugars are well controlled. Added sliding scale insulin. Monitor clinically.  4. SBO as well as antritis. Conservative management per surgery.  Likely contributed from use of Imodium at home and therefore would recommend to discontinue it  5. Hypothyroidism.  continue Synthroid. TSH significantly elevated but free T4 is normal. Given that the patient is actually having diarrhea I would hold off on increasing the dose of the Synthroid at present. Recommend to recheck in 1 week and adjust the dose of the Synthroid.  6. Elevated LFT. Liver mass. Patient's LFTs are elevated at present and they  are trending downwards. Likely in the setting of dehydration and organ damage from diarrhea which is also caused acute kidney injury. At present would avoid hepatotoxic medication and monitor. Recheck LFT with PCP in one week. Patient has also a liver mass suspicious for possible malignancy or metastasis. Patient will need MRI of the liver ideally as an outpatient once her LFTs stabilizes.  7. Essential hypertension. Tolerating 25 mg Lopressor. Patient is on verapamil and Lopressor. Given that the patient has evidence of sinus bradycardia with first-degree heart block on telemetry I would recommend to discontinue verapamil and continue only Lopressor Blood pressure well-controlled.  Recommendation on discharge: outpatient nephrology referral. Complete azithromycin total of 5 days. Follow-up with PCP in one week with renal and liver function.  Outpatient MRI liver once liver function stabilizes. Discontinue verapamil continue Lopressor. Discontinue triamterene HCTZ. Check thyroid level in one week  Disposition: We will sign off to follow the patient.  Barriers to safe discharge: Patient can be safely discharged from medical point of view  DVT Prophylaxis: subcutaneous Heparin, ted stocking  Nutrition: Soft diet  Thank you very much for involving [place] in care of your patient.       Advance goals of care discussion: full code  Family Communication: no family was present at bedside, at the time of interview.   Brief Summary of Hospitalization:  Daily update, Procedures: none Antibiotics: Anti-infectives    Start     Dose/Rate Route Frequency Ordered Stop   06/21/15 1000  azithromycin (ZITHROMAX) tablet 500 mg     500 mg Oral Daily 06/20/15 1001 06/22/15 0745   06/19/15 1000  azithromycin (ZITHROMAX) tablet 500 mg     500 mg Oral Daily  06/18/15 1356 06/20/15 0958   06/18/15 1130  azithromycin (ZITHROMAX) 500 mg in dextrose 5 % 250 mL IVPB  Status:  Discontinued     500 mg 250  mL/hr over 60 Minutes Intravenous Every 24 hours 06/18/15 1006 06/18/15 1355   06/15/15 1500  piperacillin-tazobactam (ZOSYN) IVPB 3.375 g  Status:  Discontinued     3.375 g 12.5 mL/hr over 240 Minutes Intravenous Every 8 hours 06/15/15 1453 06/17/15 0957      Intake/Output Summary (Last 24 hours) at 06/22/15 1112 Last data filed at 06/22/15 0700  Gross per 24 hour  Intake    240 ml  Output    600 ml  Net   -360 ml   Filed Weights   06/15/15 0601  Weight: 77.111 kg (170 lb)   Objective: Physical Exam: Filed Vitals:   06/21/15 0543 06/21/15 1440 06/21/15 2145 06/22/15 0450  BP: 114/54 141/66 112/59 148/80  Pulse: 72 68 68 70  Temp: 98.4 F (36.9 C) 98.6 F (37 C) 98.2 F (36.8 C) 97.9 F (36.6 C)  TempSrc: Oral Oral Oral   Resp: 16 18 18 17   Height:      Weight:      SpO2: 99% 98% 99% 99%   General: Appear in mild distress, no Rash; Oral Mucosa moist. Cardiovascular: S1 and S2 Present, no Murmur, difficult to assess  JVD Respiratory: Bilateral Air entry present and Clear to Auscultation, no Crackles, no wheezes Abdomen: Bowel Sound present, Soft and no tenderness Extremities: no Pedal edema, no calf tenderness  Data Reviewed: CBC:  Recent Labs Lab 06/15/15 1656 06/16/15 0607 06/18/15 0453 06/20/15 0542  WBC 11.4* 8.1 6.9 8.1  HGB 13.0 11.5* 11.4* 10.0*  HCT 41.3 37.3 37.2 33.1*  MCV 82.9 83.3 82.9 84.4  PLT 219 220 191 Q000111Q   Basic Metabolic Panel:  Recent Labs Lab 06/16/15 0607  06/17/15 0525 06/18/15 0453 06/19/15 0404 06/20/15 0542 06/21/15 0524 06/22/15 0240  NA 141  < > 138 143 144 143 142 142  K 2.9*  < > 3.4* 4.0 3.5 3.6 4.7 3.9  CL 100*  < > 105 111 113* 117* 118* 115*  CO2 26  < > 23 21* 21* 19* 15* 19*  GLUCOSE 103*  < > 180* 175* 236* 200* 189* 129*  BUN 28*  < > 27* 26* 19 9 9 10   CREATININE 1.72*  < > 2.03* 2.37* 1.87* 1.17* 1.12* 1.23*  CALCIUM 8.2*  < > 7.7* 7.5* 7.7* 7.5* 8.2* 8.7*  MG 1.6*  --  2.2  --   --   --   --   --     PHOS  --   --   --   --  3.0  --   --   --   < > = values in this interval not displayed. Liver Function Tests:  Recent Labs Lab 06/18/15 0453 06/19/15 0404 06/20/15 0542 06/21/15 0524 06/22/15 0240  AST 289*  --  72* 48* 31  ALT 325*  --  184* 143* 112*  ALKPHOS 305*  --  296* 272* 228*  BILITOT 0.6  --  0.4 0.7 0.3  PROT 5.7*  --  5.1* 5.4* 5.7*  ALBUMIN 2.7* 2.6* 2.3* 2.4* 2.5*   No results for input(s): LIPASE, AMYLASE in the last 168 hours. No results for input(s): AMMONIA in the last 168 hours.  Cardiac Enzymes: No results for input(s): CKTOTAL, CKMB, CKMBINDEX, TROPONINI in the last 168 hours. BNP (last 3 results)  No results for input(s): BNP in the last 8760 hours.  CBG:  Recent Labs Lab 06/21/15 0747 06/21/15 1141 06/21/15 1706 06/21/15 2141 06/22/15 0746  GLUCAP 169* 167* 85 108* 128*   Recent Results (from the past 240 hour(s))  Urine culture     Status: None   Collection Time: 06/15/15  6:03 AM  Result Value Ref Range Status   Specimen Description URINE, CATHETERIZED  Final   Special Requests NONE  Final   Culture MULTIPLE SPECIES PRESENT, SUGGEST RECOLLECTION  Final   Report Status 06/16/2015 FINAL  Final  C difficile quick scan w PCR reflex     Status: None   Collection Time: 06/17/15 10:03 AM  Result Value Ref Range Status   C Diff antigen NEGATIVE NEGATIVE Final   C Diff toxin NEGATIVE NEGATIVE Final   C Diff interpretation Negative for toxigenic C. difficile  Final  Gastrointestinal Panel by PCR , Stool     Status: Abnormal   Collection Time: 06/17/15 10:03 AM  Result Value Ref Range Status   Campylobacter species DETECTED (A) NOT DETECTED Final    Comment: CRITICAL RESULT CALLED TO, READ BACK BY AND VERIFIED WITH: Dalene Carrow (RN) AT 1700 06/17/15 KLK.    Plesimonas shigelloides NOT DETECTED NOT DETECTED Final   Salmonella species NOT DETECTED NOT DETECTED Final   Yersinia enterocolitica NOT DETECTED NOT DETECTED Final   Vibrio species  NOT DETECTED NOT DETECTED Final   Vibrio cholerae NOT DETECTED NOT DETECTED Final   Enteroaggregative E coli (EAEC) NOT DETECTED NOT DETECTED Final   Enteropathogenic E coli (EPEC) NOT DETECTED NOT DETECTED Final   Enterotoxigenic E coli (ETEC) NOT DETECTED NOT DETECTED Final   Shiga like toxin producing E coli (STEC) NOT DETECTED NOT DETECTED Final   E. coli O157 NOT DETECTED NOT DETECTED Final   Shigella/Enteroinvasive E coli (EIEC) NOT DETECTED NOT DETECTED Final   Cryptosporidium NOT DETECTED NOT DETECTED Final   Cyclospora cayetanensis NOT DETECTED NOT DETECTED Final   Entamoeba histolytica NOT DETECTED NOT DETECTED Final   Giardia lamblia NOT DETECTED NOT DETECTED Final   Adenovirus F40/41 NOT DETECTED NOT DETECTED Final   Astrovirus NOT DETECTED NOT DETECTED Final   Norovirus GI/GII NOT DETECTED NOT DETECTED Final   Rotavirus A NOT DETECTED NOT DETECTED Final   Sapovirus (I, II, IV, and V) NOT DETECTED NOT DETECTED Final   Studies: No results found.   Scheduled Meds: . escitalopram  10 mg Oral Daily  . famotidine  20 mg Oral Daily  . glipiZIDE  10 mg Oral Daily  . heparin  5,000 Units Subcutaneous Q8H  . insulin aspart  0-15 Units Subcutaneous TID WC  . insulin aspart  0-5 Units Subcutaneous QHS  . levothyroxine  112 mcg Oral QAC breakfast  . metoprolol tartrate  25 mg Oral BID  . pioglitazone  15 mg Oral Daily  . potassium chloride  10 mEq Oral BID PC  . saccharomyces boulardii  250 mg Oral BID   Continuous Infusions:   PRN Meds: acetaminophen **OR** acetaminophen, ALPRAZolam, bismuth subsalicylate, diphenhydrAMINE **OR** diphenhydrAMINE, HYDROmorphone (DILAUDID) injection, ondansetron **OR** ondansetron (ZOFRAN) IV, polyvinyl alcohol  Time spent: 30 minutes  Author: Berle Mull, MD [facility] Pager: [phone] 06/22/2015 11:12 AM  If 7PM-7AM, please contact night-coverage at [email or web address], password [facility]

## 2015-06-22 NOTE — Discharge Instructions (Signed)
Chronic Kidney Disease Chronic kidney disease occurs when the kidneys are damaged over a long period. The kidneys are two organs that lie on either side of the spine between the middle of the back and the front of the abdomen. The kidneys:  Remove wastes and extra water from the blood.  Produce important hormones. These help keep bones strong, regulate blood pressure, and help create red blood cells.  Balance the fluids and chemicals in the blood and tissues. A small amount of kidney damage may not cause problems, but a large amount of damage may make it difficult or impossible for the kidneys to work the way they should. If steps are not taken to slow down the kidney damage or stop it from getting worse, the kidneys may stop working permanently. Most of the time, chronic kidney disease does not go away. However, it can often be controlled, and those with the disease can usually live normal lives. CAUSES The most common causes of chronic kidney disease are diabetes and high blood pressure (hypertension). Chronic kidney disease may also be caused by:  Diseases that cause the kidneys' filters to become inflamed.  Diseases that affect the immune system.  Genetic diseases.  Medicines that damage the kidneys, such as anti-inflammatory medicines.  Poisoning or exposure to toxic substances.  A reoccurring kidney or urinary infection.  A problem with urine flow. This may be caused by:  Cancer.  Kidney stones.  An enlarged prostate in males. SIGNS AND SYMPTOMS Because the kidney damage in chronic kidney disease occurs slowly, symptoms develop slowly and may not be obvious until the kidney damage becomes severe. A person may have a kidney disease for years without showing any symptoms. Symptoms can include:  Swelling (edema) of the legs, ankles, or feet.  Tiredness (lethargy).  Nausea or vomiting.  Confusion.  Problems with urination, such as:  Decreased urine  production.  Frequent urination, especially at night.  Frequent accidents in children who are potty trained.  Muscle twitches and cramps.  Shortness of breath.  Weakness.  Persistent itchiness.  Loss of appetite.  Metallic taste in the mouth.  Trouble sleeping.  Slowed development in children.  Short stature in children. DIAGNOSIS Chronic kidney disease may be detected and diagnosed by tests, including blood, urine, imaging, or kidney biopsy tests. TREATMENT Most chronic kidney diseases cannot be cured. Treatment usually involves relieving symptoms and preventing or slowing the progression of the disease. Treatment may include:  A special diet. You may need to avoid alcohol and foods thatare salty and high in potassium.  Medicines. These may:  Lower blood pressure.  Relieve anemia.  Relieve swelling.  Protect the bones. HOME CARE INSTRUCTIONS  Follow your prescribed diet. Your health care provider may instruct you to limit daily salt (sodium) and protein intake.  Take medicines only as directed by your health care provider. Do not take any new medicines (prescription, over-the-counter, or nutritional supplements) unless approved by your health care provider. Many medicines can worsen your kidney damage or need to have the dose adjusted.   Quit smoking if you smoke. Talk to your health care provider about a smoking cessation program.  Keep all follow-up visits as directed by your health care provider.  Monitor your blood pressure.  Start or continue an exercise plan.  Get immunizations as directed by your health care provider.  Take vitamin and mineral supplements as directed by your health care provider. SEEK IMMEDIATE MEDICAL CARE IF:  Your symptoms get worse or you develop  new symptoms.  You develop symptoms of end-stage kidney disease. These include:  Headaches.  Abnormally dark or light skin.  Numbness in the hands or feet.  Easy  bruising.  Frequent hiccups.  Menstruation stops.  You have a fever.  You have decreased urine production.  You havepain or bleeding when urinating. MAKE SURE YOU:  Understand these instructions.  Will watch your condition.  Will get help right away if you are not doing well or get worse. FOR MORE INFORMATION   American Association of Kidney Patients: BombTimer.gl  National Kidney Foundation: www.kidney.[place]: https://mathis.com/  [facility]: www.lifeoptions.org and www.kidneyschool.org   This information is not intended to replace advice given to you by your health care provider. Make sure you discuss any questions you have with your health care provider.   Document Released: 11/17/2007 Document Revised: 02/28/2014 Document Reviewed: 10/07/2011 Elsevier Interactive Patient Education 2016 Elsevier Inc[employer] Colitis Colitis is inflammation of the colon. Colitis may last a short time (acute) or it may last a long time (chronic). CAUSES This condition may be caused by:  Viruses.  Bacteria.  Reactions to medicine.  Certain autoimmune diseases, such as Crohn disease or ulcerative colitis. SYMPTOMS Symptoms of this condition include:  Diarrhea.  Passing bloody or tarry stool.  Pain.  Fever.  Vomiting.  Tiredness (fatigue).  Weight loss.  Bloating.  Sudden increase in abdominal pain.  Having fewer bowel movements than usual. DIAGNOSIS This condition is diagnosed with a stool test or a blood test. You may also have other tests, including X-rays, a CT scan, or a colonoscopy. TREATMENT Treatment may include:  Resting the bowel. This involves not eating or drinking for a period of time.  Fluids that are given through an IV tube.  Medicine for pain and diarrhea.  Antibiotic medicines.  Cortisone medicines.  Surgery. HOME CARE INSTRUCTIONS Eating and Drinking  Follow instructions from your health care  provider about eating or drinking restrictions.  Drink enough fluid to keep your urine clear or pale yellow.  Work with a dietitian to determine which foods cause your condition to flare up.  Avoid foods that cause flare-ups.  Eat a well-balanced diet. Medicines  Take over-the-counter and prescription medicines only as told by your health care provider.  If you were prescribed an antibiotic medicine, take it as told by your health care provider. Do not stop taking the antibiotic even if you start to feel better. General Instructions  Keep all follow-up visits as told by your health care provider. This is important. SEEK MEDICAL CARE IF:  Your symptoms do not go away.  You develop new symptoms. SEEK IMMEDIATE MEDICAL CARE IF:  You have a fever that does not go away with treatment.  You develop chills.  You have extreme weakness, fainting, or dehydration.  You have repeated vomiting.  You develop severe pain in your abdomen.  You pass bloody or tarry stool.   This information is not intended to replace advice given to you by your health care provider. Make sure you discuss any questions you have with your health care provider.   Document Released: 03/17/2004 Document Revised: 10/29/2014 Document Reviewed: 06/02/2014 Elsevier Interactive Patient Education [employer].

## 2015-06-22 NOTE — Progress Notes (Signed)
  Subjective: Stools soft, but more formed. Tolerating diet, and says she can get to [facility] now without accidents.   She wants to go home.  I will discuss with Dr. Posey Pronto.  Objective: Vital signs in last 24 hours: Temp:  [97.9 F (36.6 C)-98.6 F (37 C)] 97.9 F (36.6 C) (05/01 0450) Pulse Rate:  [68-70] 70 (05/01 0450) Resp:  [17-18] 17 (05/01 0450) BP: (112-148)/(59-80) 148/80 mmHg (05/01 0450) SpO2:  [98 %-99 %] 99 % (05/01 0450) Last BM Date: 06/21/15 PO 480 Urine 500 Stool x 4 recorded yesterday Afebrile, VSS Creatinine stable, up some from yesterday  Intake/Output from previous day: 04/30 0701 - 05/01 0700 In: 1260 [P.O.:480; I.V.:780] Out: 500 [Urine:500] Intake/Output this shift:    General appearance: alert, cooperative and no distress GI: soft, non-tender; bowel sounds normal; no masses,  no organomegaly  Lab Results:   Recent Labs  06/20/15 0542  WBC 8.1  HGB 10.0*  HCT 33.1*  PLT 172    BMET  Recent Labs  06/21/15 0524 06/22/15 0240  NA 142 142  K 4.7 3.9  CL 118* 115*  CO2 15* 19*  GLUCOSE 189* 129*  BUN 9 10  CREATININE 1.12* 1.23*  CALCIUM 8.2* 8.7*   PT/INR No results for input(s): LABPROT, INR in the last 72 hours.   Recent Labs Lab 06/18/15 0453 06/19/15 0404 06/20/15 0542 06/21/15 0524 06/22/15 0240  AST 289*  --  72* 48* 31  ALT 325*  --  184* 143* 112*  ALKPHOS 305*  --  296* 272* 228*  BILITOT 0.6  --  0.4 0.7 0.3  PROT 5.7*  --  5.1* 5.4* 5.7*  ALBUMIN 2.7* 2.6* 2.3* 2.4* 2.5*     Lipase     Component Value Date/Time   LIPASE 20 06/15/2015 0638     Studies/Results: No results found.  Medications: . escitalopram  10 mg Oral Daily  . famotidine  20 mg Oral Daily  . glipiZIDE  10 mg Oral Daily  . heparin  5,000 Units Subcutaneous Q8H  . insulin aspart  0-15 Units Subcutaneous TID WC  . insulin aspart  0-5 Units Subcutaneous QHS  . levothyroxine  112 mcg Oral QAC breakfast  . metoprolol tartrate  25 mg Oral BID   . pioglitazone  15 mg Oral Daily  . potassium chloride  10 mEq Oral BID PC  . saccharomyces boulardii  250 mg Oral BID    Assessment/Plan SBO Colitis sigmoid and rectum  C diff negative/Positive for Campylobacter species New liver lesion and bilateral adrenal masses Cholelithiasis  Diverticulosis without diverticulitis Mild renal insuffiencey/dehydration Acute on chronic kidney disease  Hypokalemia/hypomagnesemia Hx of left breast cancer with mastectomy, chemotherapy and radiation therapy AODM Hypertension  Hypothyroid (TSH 8.49) Arthritis  FEN: IV fluids/Carb MOD [id number] day 2 completed 06/16/15/ Azithromycin day 5/5 VTE: Heparin/SCD    Plan:  I will discuss with Dr. Posey Pronto. I will ask him to review medicines for discharge also.   If he is OK with her going home will discharge later this AM.  I want her to go back to her PCP next week and have her labs rechecked.  She is going to follow up with Oncology for her new liver and adrenal masses.  She can discuss where to follow up with PCP.     LOS: 7 days    Monica Morrison 06/22/[phone]

## 2015-06-22 NOTE — Care Management Important Message (Signed)
Important Message  Patient Details  Name: Monica Morrison MRN: [id number] Date of Birth: 15-Sep-1945   Medicare Important Message Given:  Yes    Brinley Rosete P Najee Manninen 06/22/2015, 2:02 PM

## 2015-06-29 ENCOUNTER — Encounter (HOSPITAL_BASED_OUTPATIENT_CLINIC_OR_DEPARTMENT_OTHER): Payer: Self-pay | Admitting: *Deleted

## 2015-06-29 ENCOUNTER — Emergency Department (HOSPITAL_BASED_OUTPATIENT_CLINIC_OR_DEPARTMENT_OTHER): Payer: Commercial Managed Care - HMO

## 2015-06-29 ENCOUNTER — Emergency Department (HOSPITAL_COMMUNITY)
Admission: EM | Admit: 2015-06-29 | Discharge: 2015-06-29 | Disposition: A | Discharge status: Home / Self Care | Payer: Commercial Managed Care - HMO

## 2015-06-29 ENCOUNTER — Inpatient Hospital Stay (HOSPITAL_BASED_OUTPATIENT_CLINIC_OR_DEPARTMENT_OTHER)
Admission: EM | Admit: 2015-06-29 | Discharge: 2015-07-04 | DRG: 389 | Disposition: A | Discharge status: Home Health | Payer: Commercial Managed Care - HMO | Attending: Internal Medicine | Admitting: Internal Medicine

## 2015-06-29 DIAGNOSIS — E860 Dehydration: Secondary | ICD-10-CM | POA: Diagnosis present

## 2015-06-29 DIAGNOSIS — Z7983 Long term (current) use of bisphosphonates: Secondary | ICD-10-CM | POA: Diagnosis not present

## 2015-06-29 DIAGNOSIS — K56600 Partial intestinal obstruction, unspecified as to cause: Secondary | ICD-10-CM

## 2015-06-29 DIAGNOSIS — E039 Hypothyroidism, unspecified: Secondary | ICD-10-CM | POA: Diagnosis present

## 2015-06-29 DIAGNOSIS — Z7984 Long term (current) use of oral hypoglycemic drugs: Secondary | ICD-10-CM

## 2015-06-29 DIAGNOSIS — N189 Chronic kidney disease, unspecified: Secondary | ICD-10-CM | POA: Diagnosis present

## 2015-06-29 DIAGNOSIS — I129 Hypertensive chronic kidney disease with stage 1 through stage 4 chronic kidney disease, or unspecified chronic kidney disease: Secondary | ICD-10-CM | POA: Diagnosis present

## 2015-06-29 DIAGNOSIS — R7989 Other specified abnormal findings of blood chemistry: Secondary | ICD-10-CM | POA: Diagnosis present

## 2015-06-29 DIAGNOSIS — I10 Essential (primary) hypertension: Secondary | ICD-10-CM | POA: Diagnosis not present

## 2015-06-29 DIAGNOSIS — K5669 Other intestinal obstruction: Secondary | ICD-10-CM | POA: Diagnosis present

## 2015-06-29 DIAGNOSIS — Z833 Family history of diabetes mellitus: Secondary | ICD-10-CM | POA: Diagnosis not present

## 2015-06-29 DIAGNOSIS — N179 Acute kidney failure, unspecified: Secondary | ICD-10-CM | POA: Diagnosis present

## 2015-06-29 DIAGNOSIS — Z8249 Family history of ischemic heart disease and other diseases of the circulatory system: Secondary | ICD-10-CM | POA: Diagnosis not present

## 2015-06-29 DIAGNOSIS — Z0189 Encounter for other specified special examinations: Secondary | ICD-10-CM

## 2015-06-29 DIAGNOSIS — R109 Unspecified abdominal pain: Secondary | ICD-10-CM

## 2015-06-29 DIAGNOSIS — Z853 Personal history of malignant neoplasm of breast: Secondary | ICD-10-CM

## 2015-06-29 DIAGNOSIS — R945 Abnormal results of liver function studies: Secondary | ICD-10-CM

## 2015-06-29 DIAGNOSIS — Z79899 Other long term (current) drug therapy: Secondary | ICD-10-CM | POA: Diagnosis not present

## 2015-06-29 DIAGNOSIS — E876 Hypokalemia: Secondary | ICD-10-CM | POA: Diagnosis present

## 2015-06-29 DIAGNOSIS — M79671 Pain in right foot: Secondary | ICD-10-CM | POA: Diagnosis present

## 2015-06-29 DIAGNOSIS — E1122 Type 2 diabetes mellitus with diabetic chronic kidney disease: Secondary | ICD-10-CM | POA: Diagnosis present

## 2015-06-29 DIAGNOSIS — E118 Type 2 diabetes mellitus with unspecified complications: Secondary | ICD-10-CM | POA: Diagnosis not present

## 2015-06-29 DIAGNOSIS — K56609 Unspecified intestinal obstruction, unspecified as to partial versus complete obstruction: Secondary | ICD-10-CM

## 2015-06-29 LAB — CBC WITH DIFFERENTIAL/PLATELET
BASOS ABS: 0 10*3/uL (ref 0.0–0.1)
Basophils Relative: 0 %
EOS ABS: 0.1 10*3/uL (ref 0.0–0.7)
EOS PCT: 1 %
HCT: 39.5 % (ref 36.0–46.0)
Hemoglobin: 12.5 g/dL (ref 12.0–15.0)
LYMPHS PCT: 6 %
Lymphs Abs: 0.7 10*3/uL (ref 0.7–4.0)
MCH: 26.7 pg (ref 26.0–34.0)
MCHC: 31.6 g/dL (ref 30.0–36.0)
MCV: 84.2 fL (ref 78.0–100.0)
Monocytes Absolute: 0.6 10*3/uL (ref 0.1–1.0)
Monocytes Relative: 4 %
Neutro Abs: 11.1 10*3/uL — ABNORMAL HIGH (ref 1.7–7.7)
Neutrophils Relative %: 89 %
PLATELETS: 279 10*3/uL (ref 150–400)
RBC: 4.69 MIL/uL (ref 3.87–5.11)
RDW: 19.3 % — AB (ref 11.5–15.5)
WBC: 12.5 10*3/uL — AB (ref 4.0–10.5)

## 2015-06-29 LAB — COMPREHENSIVE METABOLIC PANEL
ALT: 30 U/L (ref 14–54)
AST: 23 U/L (ref 15–41)
Albumin: 3.7 g/dL (ref 3.5–5.0)
Alkaline Phosphatase: 157 U/L — ABNORMAL HIGH (ref 38–126)
Anion gap: 8 (ref 5–15)
BILIRUBIN TOTAL: 0.4 mg/dL (ref 0.3–1.2)
BUN: 26 mg/dL — AB (ref 6–20)
CO2: 25 mmol/L (ref 22–32)
Calcium: 9.2 mg/dL (ref 8.9–10.3)
Chloride: 107 mmol/L (ref 101–111)
Creatinine, Ser: 1.45 mg/dL — ABNORMAL HIGH (ref 0.44–1.00)
GFR, EST AFRICAN AMERICAN: 41 mL/min — AB (ref >60)
GFR, EST NON AFRICAN AMERICAN: 36 mL/min — AB (ref >60)
Glucose, Bld: 178 mg/dL — ABNORMAL HIGH (ref 65–99)
POTASSIUM: 3.2 mmol/L — AB (ref 3.5–5.1)
Sodium: 140 mmol/L (ref 135–145)
TOTAL PROTEIN: 7.9 g/dL (ref 6.5–8.1)

## 2015-06-29 LAB — LIPASE, BLOOD: Lipase: 29 U/L (ref 11–51)

## 2015-06-29 MED ORDER — IOPAMIDOL (ISOVUE-300) INJECTION 61%
75.0000 mL | Freq: Once | INTRAVENOUS | Status: AC | PRN
  Administered 2015-06-29: 75 mL via INTRAVENOUS

## 2015-06-29 MED ORDER — SODIUM CHLORIDE 0.9 % IV BOLUS (SEPSIS)
500.0000 mL | Freq: Once | INTRAVENOUS | Status: AC
  Administered 2015-06-29: 500 mL via INTRAVENOUS

## 2015-06-29 MED ORDER — ONDANSETRON HCL 4 MG/2ML IJ SOLN
4.0000 mg | Freq: Once | INTRAMUSCULAR | Status: AC
  Administered 2015-06-29: 4 mg via INTRAVENOUS
  Filled 2015-06-29: qty 2

## 2015-06-29 NOTE — Plan of Care (Signed)
70 year old female who was recently admitted for small bowel obstruction/enteritis presents with abdominal pain and nausea. CT abdomen shows small bowel obstruction with transition point. ER physician will be discussing with general surgery and patient will be admitted for further management for small bowel obstruction.  Gean Birchwood.

## 2015-06-29 NOTE — ED Notes (Signed)
Pt vomited approximately 552ml of pinkish red liquid emesis.  She states she had gatorade earlier this evening.

## 2015-06-29 NOTE — ED Notes (Signed)
Attempted report 

## 2015-06-29 NOTE — ED Provider Notes (Signed)
CSN: [id number]     Arrival date & time 06/29/15  1704 History  By signing my name below, I, Doran Stabler, attest that this documentation has been prepared under the direction and in the presence of Veryl Speak, MD. Electronically Signed: Doran Stabler, ED Scribe. 06/29/2015. 6:13 PM.   Chief Complaint  Patient presents with  . Diarrhea  . Emesis   The history is provided by the patient. No language interpreter was used.   HPI Comments: Monica Morrison is a 70 y.o. female who presents to the Emergency Department with a PMHx of HTN and DM complaining of intermittent N/V/D for the past 2 days. Pt also reports abdominal pain. Pt was discharged 1 week ago with similar symptoms. Pt was admitted for a small bowel obstruction but later dx with colitis. She was discharged on 06/22/15 and told to follow up with her PCP. She states she was feeling better for a few days until she suddenly began having these symptoms. Pt denies any fevers, chill, CP, SOB, or any other symtoms at this time.  Past Medical History  Diagnosis Date  . Cancer (HCC)     breast  . Hypertension   . Diabetes mellitus   . Arthritis   . Breast cancer ([place])   . CKD (chronic kidney disease)   . Hypothyroidism 06/22/2015   Past Surgical History  Procedure Laterality Date  . Abdominal hysterectomy    . Breast surgery     Family History  Problem Relation Age of Onset  . Diabetes Mother   . Hypertension Mother   . Diabetes Sister   . Hypertension Sister    Social History  Substance Use Topics  . Smoking status: Never Smoker   . Smokeless tobacco: None  . Alcohol Use: No   OB History    No data available     Review of Systems  A complete 10 system review of systems was obtained and all systems are negative except as noted in the HPI and PMH.    Allergies  Review of patient's allergies indicates no known allergies.  Home Medications   Prior to Admission medications   Medication Sig Start Date End Date Taking?  Authorizing Provider  alendronate (FOSAMAX) 70 MG tablet Take 70 mg by mouth every 7 (seven) days. Take with a full glass of water on an empty stomach.     Historical Provider, MD  ALPRAZolam Duanne Moron) 0.25 MG tablet Take 0.25 mg by mouth at bedtime as needed.    Historical Provider, MD  atorvastatin (LIPITOR) 20 MG tablet Take 20 mg by mouth daily.    Historical Provider, MD  bismuth subsalicylate (PEPTO BISMOL) 262 MG/15ML suspension USE AS DIRECTED FOR LOOSE STOOLS. 06/22/15   Earnstine Regal, PA-C  Calcium Carbonate (CALCIUM 500 PO) Take by mouth.      Historical Provider, MD  escitalopram (LEXAPRO) 10 MG tablet  01/06/15   Historical Provider, MD  glipiZIDE (GLUCOTROL) 5 MG tablet Take 10 mg by mouth daily.     Historical Provider, MD  levothyroxine (SYNTHROID, LEVOTHROID) 112 MCG tablet Take 112 mcg by mouth daily.      Historical Provider, MD  metoprolol tartrate (LOPRESSOR) 25 MG tablet Take 1 tablet (25 mg total) by mouth 2 (two) times daily. 06/22/15   Earnstine Regal, PA-C  Multiple Vitamins-Minerals (MULTIVITAMIN PO) Take by mouth.    Historical Provider, MD  omeprazole (PRILOSEC) 20 MG capsule  01/21/14   Historical Provider, MD  ONE TOUCH ULTRA TEST test strip  01/03/13   Historical Provider, MD  pioglitazone (ACTOS) 15 MG tablet Take 15 mg by mouth daily.      Historical Provider, MD  saccharomyces boulardii (FLORASTOR) 250 MG capsule YOU CAN BUY THIS AT ANY DRUG STORE, AND FOLLOW DIRECTIONS ON PACKAGE.  USE FOR THE NEXT 2-3 WEEKS TILL YOUR STOOLS ARE NORMAL. 06/22/15   Earnstine Regal, PA-C   BP 181/90 mmHg  Pulse 78  Temp(Src) 98.5 F (36.9 C) (Oral)  Resp 20  Ht 5\' 3"  (1.6 m)  Wt 170 lb (77.111 kg)  BMI 30.12 kg/m2  SpO2 99%   Physical Exam  Constitutional: She is oriented to person, place, and time. She appears well-developed and well-nourished. No distress.  HENT:  Head: Normocephalic and atraumatic.  Mouth/Throat: Oropharynx is clear and moist. No oropharyngeal exudate.   Eyes: Conjunctivae and EOM are normal. Pupils are equal, round, and reactive to light.  Neck: Normal range of motion. Neck supple.  No meningismus.  Cardiovascular: Normal rate, regular rhythm, normal heart sounds and intact distal pulses.   No murmur heard. Pulmonary/Chest: Effort normal and breath sounds normal. No respiratory distress.  Abdominal: Soft. There is no tenderness. There is no rebound and no guarding.  Musculoskeletal: Normal range of motion. She exhibits no edema or tenderness.  Neurological: She is alert and oriented to person, place, and time. No cranial nerve deficit. She exhibits normal muscle tone. Coordination normal.  No ataxia on finger to nose bilaterally. No pronator drift. 5/5 strength throughout. CN 2-12 intact.Equal grip strength. Sensation intact.   Skin: Skin is warm.  Psychiatric: She has a normal mood and affect. Her behavior is normal.  Nursing note and vitals reviewed.   ED Course  Procedures  DIAGNOSTIC STUDIES: Oxygen Saturation is 99% on room air, normal by my interpretation.    COORDINATION OF CARE: 6:01 PM Will give fluids. Will order blood work. Discussed treatment plan with pt and family at bedside and they agreed to plan.  Labs Review Labs Reviewed  COMPREHENSIVE METABOLIC PANEL  CBC WITH DIFFERENTIAL/PLATELET  LIPASE, BLOOD    Imaging Review No results found. I have personally reviewed and evaluated these images and lab results as part of my medical decision-making.    MDM   Final diagnoses:  None    Patient presents with vomiting and appears to have a recurrent bowel obstruction. She was admitted 2 weeks ago with similar complaints. She began vomiting 2 days ago. Today's workup reveals an elevated white count with apparent recurrent small bowel obstruction with transition point noted. She is not vomiting actively in the ER. I've discussed these findings with Dr. Barry Dienes from general surgery and the patient will be admitted to the  hospitalist service under the care of Dr. Hal Hope.  I personally performed the services described in this documentation, which was scribed in my presence. The recorded information has been reviewed and is accurate.       Veryl Speak, MD 06/29/15 [phone]

## 2015-06-29 NOTE — ED Notes (Signed)
Warm blanket offered.

## 2015-06-29 NOTE — ED Notes (Signed)
Vomiting and diarrhea since yesterday. She was discharged a week ago with same.

## 2015-06-29 NOTE — ED Notes (Signed)
Pt transferred to [facility] via [facility] 

## 2015-06-30 ENCOUNTER — Encounter (HOSPITAL_COMMUNITY): Payer: Self-pay | Admitting: Family Medicine

## 2015-06-30 ENCOUNTER — Inpatient Hospital Stay (HOSPITAL_COMMUNITY): Payer: Commercial Managed Care - HMO

## 2015-06-30 DIAGNOSIS — E118 Type 2 diabetes mellitus with unspecified complications: Secondary | ICD-10-CM

## 2015-06-30 DIAGNOSIS — E860 Dehydration: Secondary | ICD-10-CM | POA: Diagnosis present

## 2015-06-30 DIAGNOSIS — I10 Essential (primary) hypertension: Secondary | ICD-10-CM | POA: Diagnosis present

## 2015-06-30 DIAGNOSIS — E039 Hypothyroidism, unspecified: Secondary | ICD-10-CM

## 2015-06-30 DIAGNOSIS — R7989 Other specified abnormal findings of blood chemistry: Secondary | ICD-10-CM

## 2015-06-30 DIAGNOSIS — E876 Hypokalemia: Secondary | ICD-10-CM | POA: Diagnosis present

## 2015-06-30 LAB — CBC
HCT: 34.1 % — ABNORMAL LOW (ref 36.0–46.0)
HCT: 33.2 % — ABNORMAL LOW (ref 36.0–46.0)
HEMOGLOBIN: 10.3 g/dL — AB (ref 12.0–15.0)
Hemoglobin: 10.5 g/dL — ABNORMAL LOW (ref 12.0–15.0)
MCH: 26.1 pg (ref 26.0–34.0)
MCH: 26.8 pg (ref 26.0–34.0)
MCHC: 30.8 g/dL (ref 30.0–36.0)
MCHC: 31 g/dL (ref 30.0–36.0)
MCV: 84.6 fL (ref 78.0–100.0)
MCV: 86.5 fL (ref 78.0–100.0)
PLATELETS: 238 10*3/uL (ref 150–400)
Platelets: 245 10*3/uL (ref 150–400)
RBC: 4.03 MIL/uL (ref 3.87–5.11)
RBC: 3.84 MIL/uL — AB (ref 3.87–5.11)
RDW: 18.3 % — ABNORMAL HIGH (ref 11.5–15.5)
RDW: 18.5 % — ABNORMAL HIGH (ref 11.5–15.5)
WBC: 8.1 10*3/uL (ref 4.0–10.5)
WBC: 7.5 10*3/uL (ref 4.0–10.5)

## 2015-06-30 LAB — CREATININE, SERUM
CREATININE: 1.34 mg/dL — AB (ref 0.44–1.00)
GFR calc Af Amer: 45 mL/min — ABNORMAL LOW (ref >60)
GFR calc non Af Amer: 39 mL/min — ABNORMAL LOW (ref >60)

## 2015-06-30 LAB — COMPREHENSIVE METABOLIC PANEL
ALK PHOS: 145 U/L — AB (ref 38–126)
ALT: 28 U/L (ref 14–54)
ANION GAP: 15 (ref 5–15)
AST: 30 U/L (ref 15–41)
Albumin: 2.8 g/dL — ABNORMAL LOW (ref 3.5–5.0)
BILIRUBIN TOTAL: 0.3 mg/dL (ref 0.3–1.2)
BUN: 21 mg/dL — ABNORMAL HIGH (ref 6–20)
CALCIUM: 8.7 mg/dL — AB (ref 8.9–10.3)
CO2: 25 mmol/L (ref 22–32)
Chloride: 104 mmol/L (ref 101–111)
Creatinine, Ser: 1.4 mg/dL — ABNORMAL HIGH (ref 0.44–1.00)
GFR calc non Af Amer: 37 mL/min — ABNORMAL LOW (ref >60)
GFR, EST AFRICAN AMERICAN: 43 mL/min — AB (ref >60)
Glucose, Bld: 151 mg/dL — ABNORMAL HIGH (ref 65–99)
Potassium: 3.1 mmol/L — ABNORMAL LOW (ref 3.5–5.1)
Sodium: 144 mmol/L (ref 135–145)
TOTAL PROTEIN: 5.9 g/dL — AB (ref 6.5–8.1)

## 2015-06-30 LAB — GLUCOSE, CAPILLARY
GLUCOSE-CAPILLARY: 146 mg/dL — AB (ref 65–99)
GLUCOSE-CAPILLARY: 141 mg/dL — AB (ref 65–99)
GLUCOSE-CAPILLARY: 151 mg/dL — AB (ref 65–99)
GLUCOSE-CAPILLARY: 90 mg/dL (ref 65–99)
Glucose-Capillary: 128 mg/dL — ABNORMAL HIGH (ref 65–99)
Glucose-Capillary: 85 mg/dL (ref 65–99)

## 2015-06-30 MED ORDER — ENOXAPARIN SODIUM 40 MG/0.4ML ~~LOC~~ SOLN
40.0000 mg | SUBCUTANEOUS | Status: DC
  Administered 2015-06-30 – 2015-07-04 (×5): 40 mg via SUBCUTANEOUS
  Filled 2015-06-30 (×5): qty 0.4

## 2015-06-30 MED ORDER — MORPHINE SULFATE (PF) 2 MG/ML IV SOLN
2.0000 mg | INTRAVENOUS | Status: DC | PRN
  Administered 2015-06-30 – 2015-07-03 (×3): 2 mg via INTRAVENOUS
  Filled 2015-06-30 (×3): qty 1

## 2015-06-30 MED ORDER — ACETAMINOPHEN 325 MG PO TABS
650.0000 mg | ORAL_TABLET | Freq: Four times a day (QID) | ORAL | Status: DC | PRN
  Administered 2015-07-02: 650 mg via ORAL
  Filled 2015-06-30: qty 2

## 2015-06-30 MED ORDER — SODIUM CHLORIDE 0.9 % IV SOLN
INTRAVENOUS | Status: DC
  Administered 2015-06-30 – 2015-07-01 (×2): via INTRAVENOUS

## 2015-06-30 MED ORDER — ACETAMINOPHEN 650 MG RE SUPP: 650.0000 mg | Freq: Four times a day (QID) | RECTAL | Status: DC | PRN

## 2015-06-30 MED ORDER — ONDANSETRON HCL 4 MG PO TABS: 4.0000 mg | ORAL_TABLET | Freq: Four times a day (QID) | ORAL | Status: DC | PRN

## 2015-06-30 MED ORDER — SODIUM CHLORIDE 0.9 % IV SOLN: INTRAVENOUS | Status: DC

## 2015-06-30 MED ORDER — ONDANSETRON HCL 4 MG/2ML IJ SOLN
4.0000 mg | Freq: Four times a day (QID) | INTRAMUSCULAR | Status: DC | PRN
  Administered 2015-06-30: 4 mg via INTRAVENOUS
  Filled 2015-06-30: qty 2

## 2015-06-30 MED ORDER — POTASSIUM CHLORIDE IN NACL 20-0.9 MEQ/L-% IV SOLN
INTRAVENOUS | Status: DC
  Administered 2015-06-30 (×2): via INTRAVENOUS
  Filled 2015-06-30 (×2): qty 1000

## 2015-06-30 MED ORDER — POTASSIUM CHLORIDE 20 MEQ/15ML (10%) PO SOLN
20.0000 meq | Freq: Once | ORAL | Status: AC
  Administered 2015-06-30: 20 meq via ORAL
  Filled 2015-06-30: qty 15

## 2015-06-30 MED ORDER — INSULIN ASPART 100 UNIT/ML ~~LOC~~ SOLN
0.0000 [IU] | SUBCUTANEOUS | Status: DC
  Administered 2015-06-30: 1 [IU] via SUBCUTANEOUS
  Administered 2015-06-30: 2 [IU] via SUBCUTANEOUS
  Administered 2015-06-30 – 2015-07-01 (×3): 1 [IU] via SUBCUTANEOUS
  Administered 2015-07-02 (×2): 2 [IU] via SUBCUTANEOUS
  Administered 2015-07-02: 1 [IU] via SUBCUTANEOUS
  Administered 2015-07-02: 3 [IU] via SUBCUTANEOUS
  Administered 2015-07-02: 2 [IU] via SUBCUTANEOUS
  Administered 2015-07-02: 3 [IU] via SUBCUTANEOUS
  Administered 2015-07-03 (×2): 2 [IU] via SUBCUTANEOUS
  Administered 2015-07-03 (×2): 3 [IU] via SUBCUTANEOUS
  Administered 2015-07-03: 5 [IU] via SUBCUTANEOUS
  Administered 2015-07-04 (×3): 2 [IU] via SUBCUTANEOUS
  Administered 2015-07-04: 5 [IU] via SUBCUTANEOUS

## 2015-06-30 MED ORDER — DIATRIZOATE MEGLUMINE & SODIUM 66-10 % PO SOLN
90.0000 mL | Freq: Once | ORAL | Status: AC
  Administered 2015-06-30: 90 mL via NASOGASTRIC
  Filled 2015-06-30: qty 90

## 2015-06-30 MED ORDER — LABETALOL HCL 5 MG/ML IV SOLN
10.0000 mg | Freq: Four times a day (QID) | INTRAVENOUS | Status: DC | PRN
  Filled 2015-06-30: qty 4

## 2015-06-30 NOTE — H&P (Signed)
History and Physical  Patient Name: Monica Morrison     JXB:[id number]    DOB: Sep 16, 1945    DOA: 06/29/2015 Referring provider: Trish Mage, MD PCP: Gara Kroner, MD  Outpatient specialists:  Nicholas Lose, Oncology     Otho Ket, General Surgery Mastectomy Patient coming from: Home  Chief Complaint: Vomiting and abdominal pain  HPI: Monica Morrison is a 70 y.o. female with a past medical history significant for BrCA, HTN, NIDDM, hypothyroidism, and recent SBO/AKI/campylobacter who presents with vomiting and pain again.  The patient was recently admitted for 1 week for SBO, AKI and campylobacter colitis.  Her SBO was treated conservatively without NG tube, colitis was treated with Zosyn transitioned to azithromycin for total of 5 days, since then with bismuth and probiotics, and AKI was presumed pre-renal and resolved with stopping Zosyn and giving IVF.  Since discharge, the patient has been back at home.  She is back to her baseline, ambulating short distances without a walker, independent with all ADLs, and without abdominal pain or vomiting.  She has had persistent soft BMs, 3+ per day.  She had been pushing fluids and advancing her diet and Sunday/yesterday was able to go to [employer].  Unfortunately, Sunday she also started to have NBNB vomiting again.  Today, abdominal pain returned, moderate in intensity, diffuse, and crampy in character, and NBNB vomiting continued, so she returned to the ER.  In the ED, she had low grade temperature, hypertension.  Na 140, K 3.2, Cr 1.45 (from baseline 1.1, peak 2.3 during last hospitalization), BUN 26, Alk phos slightly elevated, WBC 12.5K, Hgb normal.  Lipase normal.  CT abdomen and pelvis with contrast showed again SBO, colitis and transition point.  Case was discussed with Surgery who recommended conservative mgmt.  [facility] were asked to accept in transfer.  Patient last had a colonoscopy she thinks about 7 years ago, not due for three years.  Liver nodules  were noted on CT during last hospitalization, MRI of the liver is recommended as outpatient.     Review of Systems:  All other systems negative except as just noted or noted in the history of present illness.    Past Medical History  Diagnosis Date  . Cancer (HCC)     breast  . Hypertension   . Diabetes mellitus   . Arthritis   . Breast cancer ([place])   . CKD (chronic kidney disease)   . Hypothyroidism 06/22/2015    Past Surgical History  Procedure Laterality Date  . Abdominal hysterectomy    . Breast surgery      Social History: Patient lives alone, next door to her sister.  She uses a walker only for long distances outside the home.  Her sister cooks all meals for her.  She does not smoke.  She does not have dementia.    No Known Allergies  Family history: family history includes Diabetes in her mother and sister; Hypertension in her mother and sister.  Prior to Admission medications   Medication Sig Start Date End Date Taking? Authorizing Provider  alendronate (FOSAMAX) 70 MG tablet Take 70 mg by mouth every 7 (seven) days. Take with a full glass of water on an empty stomach.     Historical Provider, MD  ALPRAZolam Duanne Moron) 0.25 MG tablet Take 0.25 mg by mouth at bedtime as needed.    Historical Provider, MD  atorvastatin (LIPITOR) 20 MG tablet Take 20 mg by mouth daily.    Historical Provider, MD  bismuth subsalicylate (PEPTO  BISMOL) 262 MG/15ML suspension USE AS DIRECTED FOR LOOSE STOOLS. 06/22/15   Earnstine Regal, PA-C  Calcium Carbonate (CALCIUM 500 PO) Take by mouth.      Historical Provider, MD  escitalopram (LEXAPRO) 10 MG tablet  01/06/15   Historical Provider, MD  glipiZIDE (GLUCOTROL) 5 MG tablet Take 10 mg by mouth daily.     Historical Provider, MD  levothyroxine (SYNTHROID, LEVOTHROID) 112 MCG tablet Take 112 mcg by mouth daily.      Historical Provider, MD  metoprolol tartrate (LOPRESSOR) 25 MG tablet Take 1 tablet (25 mg total) by mouth 2 (two) times daily.  06/22/15   Earnstine Regal, PA-C  Multiple Vitamins-Minerals (MULTIVITAMIN PO) Take by mouth.    Historical Provider, MD  omeprazole (PRILOSEC) 20 MG capsule  01/21/14   Historical Provider, MD  ONE TOUCH ULTRA TEST test strip  01/03/13   Historical Provider, MD  pioglitazone (ACTOS) 15 MG tablet Take 15 mg by mouth daily.      Historical Provider, MD  saccharomyces boulardii (FLORASTOR) 250 MG capsule YOU CAN BUY THIS AT ANY DRUG STORE, AND FOLLOW DIRECTIONS ON PACKAGE.  USE FOR THE NEXT 2-3 WEEKS TILL YOUR STOOLS ARE NORMAL. 06/22/15   Earnstine Regal, PA-C       Physical Exam: BP 152/76 mmHg  Pulse 78  Temp(Src) 98.6 F (37 C) (Oral)  Resp 17  Ht 5' 3"  (1.6 m)  Wt 77.111 kg (170 lb)  BMI 30.12 kg/m2  SpO2 99% General appearance: Well-developed, elderly adult female, alert and in no acute distress.   Eyes: Anicteric, conjunctiva pink, lids and lashes normal.     ENT: No nasal deformity, discharge, or epistaxis.  OP moist without lesions.   Lymph: No cervical or supraclavicular lymphadenopathy. Skin: Warm and dry.  No jaundice.  No suspicious rashes or lesions. Cardiac: RRR, nl H9-Q2, systolic murmur appreciated.  Capillary refill is brisk.  JVP not visible.  Radial and DP pulses 2+ and symmetric. Respiratory: Normal respiratory rate and rhythm.  CTAB without rales or wheezes. Abdomen: Abdomen soft without rigidity.  No TTP, rigidity or rebound.  Bowel sounds hyperactive. No ascites, distension.   MSK: No deformities or effusions. Neuro: Cranial nerves normal.  Sensorium intact and responding to questions, attention normal.  Speech is fluent.  Moves all extremities equally and with normal coordination.    Psych: Behavior appropriate.  Affect normal.  No evidence of aural or visual hallucinations or delusions.       Labs on Admission:  I have personally reviewed following labs and imaging studies: CBC:  Recent Labs Lab 06/29/15 1805  WBC 12.5*  NEUTROABS 11.1*  HGB 12.5  HCT  39.5  MCV 84.2  PLT 229   Basic Metabolic Panel:  Recent Labs Lab 06/29/15 1805  NA 140  K 3.2*  CL 107  CO2 25  GLUCOSE 178*  BUN 26*  CREATININE 1.45*  CALCIUM 9.2   GFR: Estimated Creatinine Clearance: 35.5 mL/min (by C-G formula based on Cr of 1.45). Liver Function Tests:  Recent Labs Lab 06/29/15 1805  AST 23  ALT 30  ALKPHOS 157*  BILITOT 0.4  PROT 7.9  ALBUMIN 3.7    Recent Labs Lab 06/29/15 1805  LIPASE 29   No results for input(s): AMMONIA in the last 168 hours. Coagulation Profile: No results for input(s): INR, PROTIME in the last 168 hours. Cardiac Enzymes: No results for input(s): CKTOTAL, CKMB, CKMBINDEX, TROPONINI in the last 168 hours. BNP (last 3 results) No results for  input(s): PROBNP in the last 8760 hours. HbA1C: No results for input(s): HGBA1C in the last 72 hours. CBG: No results for input(s): GLUCAP in the last 168 hours. Lipid Profile: No results for input(s): CHOL, HDL, LDLCALC, TRIG, CHOLHDL, LDLDIRECT in the last 72 hours. Thyroid Function Tests: No results for input(s): TSH, T4TOTAL, FREET4, T3FREE, THYROIDAB in the last 72 hours. Anemia Panel: No results for input(s): VITAMINB12, FOLATE, FERRITIN, TIBC, IRON, RETICCTPCT in the last 72 hours. Urine analysis:    Component Value Date/Time   COLORURINE YELLOW 06/15/2015 0603   APPEARANCEUR CLOUDY* 06/15/2015 0603   LABSPEC 1.027 06/15/2015 0603   PHURINE 6.0 06/15/2015 0603   GLUCOSEU NEGATIVE 06/15/2015 0603   HGBUR NEGATIVE 06/15/2015 0603   BILIRUBINUR MODERATE* 06/15/2015 0603   KETONESUR 15* 06/15/2015 0603   PROTEINUR 30* 06/15/2015 0603   NITRITE NEGATIVE 06/15/2015 0603   LEUKOCYTESUR MODERATE* 06/15/2015 0603   Sepsis Labs: @LABRCNTIP (procalcitonin:4,lacticidven:4) )No results found for this or any previous visit (from the past 240 hour(s)).       Radiological Exams on Admission: Personally reviewed: Ct Abdomen Pelvis W Contrast  06/29/2015  CLINICAL DATA:   Abdominal pain and vomiting today. Recent colitis and small bowel obstruction. Left breast carcinoma. EXAM: CT ABDOMEN AND PELVIS WITH CONTRAST TECHNIQUE: Multidetector CT imaging of the abdomen and pelvis was performed using the standard protocol following bolus administration of intravenous contrast. CONTRAST:  27m ISOVUE-300 IOPAMIDOL (ISOVUE-300) INJECTION 61% COMPARISON:  06/15/2015 and 02/01/2007 FINDINGS: Lower chest:  No acute findings. Hepatobiliary: Poorly defined hypovascular mass in the posterior right hepatic lobe measures 3.0 cm on image 15/series 2 and has not significantly changed compared to most recent exam although it is new compared to 2008 exam. This is suspicious for a liver metastasis. No other definite liver masses are identified. Tiny calcified gallstones are again seen. Gallbladder is mildly distended but there is no evidence of wall thickening or pericholecystic inflammatory changes. Pancreas: No mass or other significant abnormality identified. Spleen: Within normal limits in size and appearance. Adrenals/Urinary Tract: Bilateral adrenal masses are again seen measuring 3.5 cm on the right and 3.2 cm on the left. These are unchanged since most recent exam but are new since 2008 and consistent with adrenal metastases. Tiny left renal cysts are again noted however there is no evidence of renal masses. Moderate right hydronephrosis and ureterectasis shows no significant change. Asymmetric wall thickening is again seen along the right lateral and posterior bladder walls. This could be due to neoplasm or cystitis. Stomach/Bowel: Small to moderate hiatal hernia seen. Multiple moderately dilated small bowel loops are again seen containing air-fluid levels. There is a transition point in the anterior lower abdomen were there is a thickened small bowel loop as well as small less than 1 cm lymph nodes in the adjacent small bowel mesentery. Colonic diverticulosis also noted, without of  diverticulitis. Vascular/Lymphatic: Bilateral iliac and retroperitoneal surgical clips seen. No pathologically enlarged lymph nodes identified.No evidence of abdominal aortic aneurysm. Reproductive: Prior hysterectomy noted. Adnexal regions are unremarkable in appearance. Other: None. Musculoskeletal:  No suspicious bone lesions identified. IMPRESSION: Stable appearance of mid small bowel obstruction with transition point in the anterior lower abdomen. Small bowel wall thickening and adjacent sub-cm mesenteric lymph nodes at the transition point may be secondary to metastatic disease, although differential diagnosis also includes ischemic and inflammatory etiologies as well as adhesion. Stable moderate right hydronephrosis and asymmetric bladder wall thickening. This may be due to bladder carcinoma or cystitis. 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 No significant change in 3 cm hypovascular right hepatic lobe mass, suspicious for hepatic metastasis. Cholelithiasis.  No radiographic evidence of cholecystitis. Small to moderate hiatal hernia. Electronically Signed   By: Earle Gell M.D.   On: 06/29/2015 21:17        Assessment/Plan 1. SBO:  Abdominal exam benign.  CT shows persistent evidence of partial SBO versus SBO.  Adhesions were thought to be lead point last hospitalization, colitis and adenitis are noted near the transition point on tonight's CT.  Ileus doubted.  Colitis treated and clinically improved.     -NPO -MIVF with K at 125 cc/hr -Ondansetron for nausea, acetaminophen and/or morphine PRN for pain -Consult to General Surgery appreciate cares   2. HTN:  Hypertensive at admission. -PRN labetalol IV while NPO for hypertension  3. Elevated creatinine vs AKI on CKD:  Baseline Cr 1.1.  Back up to 1.45 today, in setting of persistent loose stools and decreased PO intake.  During previous hospitalization was presumed to be  pre-renal. -Fluids and trend BMP -Check UA  4. Hypokalemia:  From loose stools. -Fluids with K and repeat BMP  5. Hypothyroidism:  -Hold levothyroxine for now, until able to take PO  6. NIDDM:  -Hold oral antihyperglycemics while NPO -Sliding scale corrections, low dose      DVT prophylaxis: Lovenox  Code Status: FULL  Family Communication: None present  Disposition Plan: Anticipate conservative management of SBO with IVF, antiemetics and analgesics.   Consults called: General Surgery, Dr. Barry Dienes Medical decision making: Patient seen at 1:20 AM on 06/30/2015. I recommend admission to medical surgical unit, inpatient status.  Clinical condition: stable.      Edwin Dada [facility] Pager [phone]

## 2015-06-30 NOTE — Care Management Note (Signed)
Case Management Note  Patient Details  Name: Monica Morrison MRN: [id number] Date of Birth: [birth year]  Subjective/Objective:                    Action/Plan:   Expected Discharge Date:  07/03/15               Expected Discharge Plan:  Home/Self Care  In-House Referral:     Discharge planning Services     Post Acute Care Choice:    Choice offered to:     DME Arranged:    DME Agency:     HH Arranged:    [place] Agency:     Status of Service:  In process, will continue to follow  Medicare Important Message Given:    Date Medicare IM Given:    Medicare IM give by:    Date Additional Medicare IM Given:    Additional Medicare Important Message give by:     If discussed at [place] of Stay Meetings, dates discussed:    Additional Comments:  Marilu Favre, RN 06/30/2015, 11:07 AM

## 2015-06-30 NOTE — Consult Note (Signed)
Reason for Consult:SBO Referring Physician: Lyberti Thrush is an 70 y.o. female.  HPI:  70 yo pt presents with 2-3 days of n/v/abdominal pain.  She has had symptoms like this recently and was sent home from the medicine service last week with similar symptoms.  She was told she had colitis.  She denies fever/night sweats. Moving makes the pain a little worse.  She is feeling better since coming to the hospital.  She last had BM yesterday, but does not recall when her last flatus was.    Past Medical History  Diagnosis Date  . Cancer (HCC)     breast  . Hypertension   . Diabetes mellitus   . Arthritis   . Breast cancer ([place])   . CKD (chronic kidney disease)   . Hypothyroidism 06/22/2015    Past Surgical History  Procedure Laterality Date  . Abdominal hysterectomy    . Breast surgery      Family History  Problem Relation Age of Onset  . Diabetes Mother   . Hypertension Mother   . Diabetes Sister   . Hypertension Sister     Social History:  reports that she has never smoked. She does not have any smokeless tobacco history on file. She reports that she does not drink alcohol or use illicit drugs.  Allergies: No Known Allergies  Medications:  Prior to Admission:  Prescriptions prior to admission  Medication Sig Dispense Refill Last Dose  . alendronate (FOSAMAX) 70 MG tablet Take 70 mg by mouth every 7 (seven) days. Take with a full glass of water on an empty stomach.    Past Week at Unknown time  . ALPRAZolam (XANAX) 0.25 MG tablet Take 0.25 mg by mouth at bedtime as needed.   06/14/2015 at Unknown time  . atorvastatin (LIPITOR) 20 MG tablet Take 20 mg by mouth daily.   06/14/2015 at Unknown time  . bismuth subsalicylate (PEPTO BISMOL) 262 MG/15ML suspension USE AS DIRECTED FOR LOOSE STOOLS. 360 mL 0   . Calcium Carbonate (CALCIUM 500 PO) Take by mouth.     06/14/2015 at Unknown time  . escitalopram (LEXAPRO) 10 MG tablet    06/14/2015 at Unknown time  . glipiZIDE  (GLUCOTROL) 5 MG tablet Take 10 mg by mouth daily.    06/14/2015 at Unknown time  . levothyroxine (SYNTHROID, LEVOTHROID) 112 MCG tablet Take 112 mcg by mouth daily.     06/14/2015 at Unknown time  . metoprolol tartrate (LOPRESSOR) 25 MG tablet Take 1 tablet (25 mg total) by mouth 2 (two) times daily. 60 tablet 0   . Multiple Vitamins-Minerals (MULTIVITAMIN PO) Take by mouth.   06/14/2015 at Unknown time  . omeprazole (PRILOSEC) 20 MG capsule   1 06/14/2015 at Unknown time  . ONE TOUCH ULTRA TEST test strip    06/14/2015 at Unknown time  . pioglitazone (ACTOS) 15 MG tablet Take 15 mg by mouth daily.     06/14/2015 at Unknown time  . saccharomyces boulardii (FLORASTOR) 250 MG capsule YOU CAN BUY THIS AT ANY DRUG STORE, AND FOLLOW DIRECTIONS ON PACKAGE.  USE FOR THE NEXT 2-3 WEEKS TILL YOUR STOOLS ARE NORMAL.       Results for orders placed or performed during the hospital encounter of 06/29/15 (from the past 48 hour(s))  Comprehensive metabolic panel     Status: Abnormal   Collection Time: 06/29/15  6:05 PM  Result Value Ref Range   Sodium 140 135 - 145 mmol/L   Potassium 3.2 (  L) 3.5 - 5.1 mmol/L   Chloride 107 101 - 111 mmol/L   CO2 25 22 - 32 mmol/L   Glucose, Bld 178 (H) 65 - 99 mg/dL   BUN 26 (H) 6 - 20 mg/dL   Creatinine, Ser 1.45 (H) 0.44 - 1.00 mg/dL   Calcium 9.2 8.9 - 10.3 mg/dL   Total Protein 7.9 6.5 - 8.1 g/dL   Albumin 3.7 3.5 - 5.0 g/dL   AST 23 15 - 41 U/L   ALT 30 14 - 54 U/L   Alkaline Phosphatase 157 (H) 38 - 126 U/L   Total Bilirubin 0.4 0.3 - 1.2 mg/dL   GFR calc non Af Amer 36 (L) >60 mL/min   GFR calc Af Amer 41 (L) >60 mL/min    Comment: (NOTE) The eGFR has been calculated using the CKD EPI equation. This calculation has not been validated in all clinical situations. eGFR's persistently <60 mL/min signify possible Chronic Kidney Disease.    Anion gap 8 5 - 15  CBC with Differential     Status: Abnormal   Collection Time: 06/29/15  6:05 PM  Result Value Ref Range    WBC 12.5 (H) 4.0 - 10.5 K/uL   RBC 4.69 3.87 - 5.11 MIL/uL   Hemoglobin 12.5 12.0 - 15.0 g/dL   HCT 39.5 36.0 - 46.0 %   MCV 84.2 78.0 - 100.0 fL   MCH 26.7 26.0 - 34.0 pg   MCHC 31.6 30.0 - 36.0 g/dL   RDW 19.3 (H) 11.5 - 15.5 %   Platelets 279 150 - 400 K/uL   Neutrophils Relative % 89 %   Neutro Abs 11.1 (H) 1.7 - 7.7 K/uL   Lymphocytes Relative 6 %   Lymphs Abs 0.7 0.7 - 4.0 K/uL   Monocytes Relative 4 %   Monocytes Absolute 0.6 0.1 - 1.0 K/uL   Eosinophils Relative 1 %   Eosinophils Absolute 0.1 0.0 - 0.7 K/uL   Basophils Relative 0 %   Basophils Absolute 0.0 0.0 - 0.1 K/uL  Lipase, blood     Status: None   Collection Time: 06/29/15  6:05 PM  Result Value Ref Range   Lipase 29 11 - 51 U/L  Glucose, capillary     Status: Abnormal   Collection Time: 06/30/15  1:47 AM  Result Value Ref Range   Glucose-Capillary 146 (H) 65 - 99 mg/dL  CBC     Status: Abnormal   Collection Time: 06/30/15  2:01 AM  Result Value Ref Range   WBC 8.1 4.0 - 10.5 K/uL   RBC 4.03 3.87 - 5.11 MIL/uL   Hemoglobin 10.5 (L) 12.0 - 15.0 g/dL   HCT 34.1 (L) 36.0 - 46.0 %   MCV 84.6 78.0 - 100.0 fL   MCH 26.1 26.0 - 34.0 pg   MCHC 30.8 30.0 - 36.0 g/dL   RDW 18.3 (H) 11.5 - 15.5 %   Platelets 238 150 - 400 K/uL  Creatinine, serum     Status: Abnormal   Collection Time: 06/30/15  2:01 AM  Result Value Ref Range   Creatinine, Ser 1.34 (H) 0.44 - 1.00 mg/dL   GFR calc non Af Amer 39 (L) >60 mL/min   GFR calc Af Amer 45 (L) >60 mL/min    Comment: (NOTE) The eGFR has been calculated using the CKD EPI equation. This calculation has not been validated in all clinical situations. eGFR's persistently <60 mL/min signify possible Chronic Kidney Disease.   Comprehensive metabolic panel  Status: Abnormal   Collection Time: 06/30/15  4:33 AM  Result Value Ref Range   Sodium 144 135 - 145 mmol/L   Potassium 3.1 (L) 3.5 - 5.1 mmol/L   Chloride 104 101 - 111 mmol/L   CO2 25 22 - 32 mmol/L   Glucose,  Bld 151 (H) 65 - 99 mg/dL   BUN 21 (H) 6 - 20 mg/dL   Creatinine, Ser 1.40 (H) 0.44 - 1.00 mg/dL   Calcium 8.7 (L) 8.9 - 10.3 mg/dL   Total Protein 5.9 (L) 6.5 - 8.1 g/dL   Albumin 2.8 (L) 3.5 - 5.0 g/dL   AST 30 15 - 41 U/L   ALT 28 14 - 54 U/L   Alkaline Phosphatase 145 (H) 38 - 126 U/L   Total Bilirubin 0.3 0.3 - 1.2 mg/dL   GFR calc non Af Amer 37 (L) >60 mL/min   GFR calc Af Amer 43 (L) >60 mL/min    Comment: (NOTE) The eGFR has been calculated using the CKD EPI equation. This calculation has not been validated in all clinical situations. eGFR's persistently <60 mL/min signify possible Chronic Kidney Disease.    Anion gap 15 5 - 15  CBC     Status: Abnormal   Collection Time: 06/30/15  4:33 AM  Result Value Ref Range   WBC 7.5 4.0 - 10.5 K/uL   RBC 3.84 (L) 3.87 - 5.11 MIL/uL   Hemoglobin 10.3 (L) 12.0 - 15.0 g/dL   HCT 33.2 (L) 36.0 - 46.0 %   MCV 86.5 78.0 - 100.0 fL   MCH 26.8 26.0 - 34.0 pg   MCHC 31.0 30.0 - 36.0 g/dL   RDW 18.5 (H) 11.5 - 15.5 %   Platelets 245 150 - 400 K/uL  Glucose, capillary     Status: Abnormal   Collection Time: 06/30/15  4:58 AM  Result Value Ref Range   Glucose-Capillary 141 (H) 65 - 99 mg/dL    Ct Abdomen Pelvis W Contrast  06/29/2015  CLINICAL DATA:  Abdominal pain and vomiting today. Recent colitis and small bowel obstruction. Left breast carcinoma. EXAM: CT ABDOMEN AND PELVIS WITH CONTRAST TECHNIQUE: Multidetector CT imaging of the abdomen and pelvis was performed using the standard protocol following bolus administration of intravenous contrast. CONTRAST:  3m ISOVUE-300 IOPAMIDOL (ISOVUE-300) INJECTION 61% COMPARISON:  06/15/2015 and 02/01/2007 FINDINGS: Lower chest:  No acute findings. Hepatobiliary: Poorly defined hypovascular mass in the posterior right hepatic lobe measures 3.0 cm on image 15/series 2 and has not significantly changed compared to most recent exam although it is new compared to 2008 exam. This is suspicious for a  liver metastasis. No other definite liver masses are identified. Tiny calcified gallstones are again seen. Gallbladder is mildly distended but there is no evidence of wall thickening or pericholecystic inflammatory changes. Pancreas: No mass or other significant abnormality identified. Spleen: Within normal limits in size and appearance. Adrenals/Urinary Tract: Bilateral adrenal masses are again seen measuring 3.5 cm on the right and 3.2 cm on the left. These are unchanged since most recent exam but are new since 2008 and consistent with adrenal metastases. Tiny left renal cysts are again noted however there is no evidence of renal masses. Moderate right hydronephrosis and ureterectasis shows no significant change. Asymmetric wall thickening is again seen along the right lateral and posterior bladder walls. This could be due to neoplasm or cystitis. Stomach/Bowel: Small to moderate hiatal hernia seen. Multiple moderately dilated small bowel loops are again seen containing air-fluid levels.  There is a transition point in the anterior lower abdomen were there is a thickened small bowel loop as well as small less than 1 cm lymph nodes in the adjacent small bowel mesentery. Colonic diverticulosis also noted, without of diverticulitis. Vascular/Lymphatic: Bilateral iliac and retroperitoneal surgical clips seen. No pathologically enlarged lymph nodes identified.No evidence of abdominal aortic aneurysm. Reproductive: Prior hysterectomy noted. Adnexal regions are unremarkable in appearance. Other: None. Musculoskeletal:  No suspicious bone lesions identified. IMPRESSION: Stable appearance of mid small bowel obstruction with transition point in the anterior lower abdomen. Small bowel wall thickening and adjacent sub-cm mesenteric lymph nodes at the transition point may be secondary to metastatic disease, although differential diagnosis also includes ischemic and inflammatory etiologies as well as adhesion. Stable moderate  right hydronephrosis and asymmetric bladder wall thickening. This may be due to bladder carcinoma or cystitis. 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 No significant change in 3 cm hypovascular right hepatic lobe mass, suspicious for hepatic metastasis. Cholelithiasis.  No radiographic evidence of cholecystitis. Small to moderate hiatal hernia. Electronically Signed   By: Earle Gell M.D.   On: 06/29/2015 21:17    Review of Systems  Constitutional: Negative for fever and chills.  HENT: Negative.   Eyes: Negative.   Respiratory: Negative.   Cardiovascular: Negative.   Gastrointestinal: Positive for nausea, vomiting and abdominal pain.  Genitourinary: Negative.   Musculoskeletal: Negative.   Skin: Negative.   Neurological: Negative.   Endo/Heme/Allergies: Negative.   Psychiatric/Behavioral: Negative.    Blood pressure 180/73, pulse 78, temperature 98.4 F (36.9 C), temperature source Oral, resp. rate 16, height 5' 3"  (1.6 m), weight 77.111 kg (170 lb), SpO2 99 %. Physical Exam  Constitutional: She is oriented to person, place, and time. She appears well-developed and well-nourished. No distress.  Eyes: Conjunctivae are normal. Pupils are equal, round, and reactive to light.  Neck: Normal range of motion. Neck supple. No thyromegaly present.  Cardiovascular: Normal rate.   Respiratory: Effort normal and breath sounds normal.  GI: Soft. Bowel sounds are normal. She exhibits distension (slightly). She exhibits no mass. There is no tenderness. There is no rebound and no guarding.  Neurological: She is alert and oriented to person, place, and time. Coordination normal.  Skin: Skin is warm and dry. No rash noted. She is not diaphoretic. No erythema. No pallor.  Psychiatric: She has a normal mood and affect. Her behavior is normal. Judgment and thought content normal.    Assessment/Plan: SBO Unclear if this is adhesive or secondary to some  other process.  There is concern or lymphadeoparhy She may require dx laparoscopy or ex lap for diagnosis..Will defer to Dr. Rosendo Gros to follow.  NGT would help decompress bowels for possible surgery.  Tymar Polyak 06/30/2015, 7:45 AM

## 2015-06-30 NOTE — Progress Notes (Signed)
[facility] Progress Note                                            Patient Demographics:    Monica Morrison, is a 70 y.o. female, DOB - 03/23/1945, [id number]  Admit date - 06/29/2015   Admitting Physician Rise Patience, MD  Outpatient Primary MD for the patient is Monica Kroner, MD  L[facility] - 1  [facility]  Chief Complaint  Patient presents with  . Diarrhea  . Emesis        Subjective:    Monica Morrison Patient has had a bowel movement today, pain and distention has improved, no nausea or vomiting, ng tube in place.    Principal Problem:   Small bowel obstruction (HCC) Active Problems:   Diabetes mellitus with complication (HCC)   LFT elevation   Hypothyroidism   Hypertension   Hypokalemia   Dehydration   1. CV. Patient hemodynamic stable, will continue gentle hydration with IV fluids. Will decrease rate to avoid volume overload, wbc trending down.  2. Pulmonary. Will continue to monitor oxymetry, no signs of volume overload.   3. Nephrology renal function stable, will follow renal panel in am. Will give extra kcl per po and will change fluids to saline w/o kcl, noted cr at 1.4.   4. Gastroenterology. Will continue conservative care. Will continue ng tube to suction, IV fluids and replete electrolytes.  5. Endocrinology. Will continue glucose monitor, serum glucose 151. Will continue insulin sliding scale.   Code Status :  Family Communication  :   Disposition Plan  :  Barriers For Discharge :   Consults  :  Surgery  Procedures  :  DVT Prophylaxis  :  Lovenox - Heparin - SCDs   Lab Results  Component Value Date   PLT 245 06/30/2015      Antibiotics  :   Anti-infectives    None        Objective:   Filed Vitals:   06/30/15 0103 06/30/15 0224 06/30/15 1045 06/30/15 1528  BP: 152/76 180/73 172/73 161/80  Pulse: 78 78 79 81  Temp: 98.6 F (37 C) 98.4 F (36.9 C) 98.1 F (36.7 C) 98.4 F (36.9 C)  TempSrc: Oral Oral Oral Oral  Resp: 17 16 17 17   Height:      Weight:      SpO2: 99% 99% 97% 99%    Wt Readings from Last 3 Encounters:  06/29/15 77.111 kg (170 lb)  06/15/15 77.111 kg (170 lb)  02/09/15 78.382 kg (172 lb 12.8 oz)     Intake/Output Summary (Last 24 hours) at 06/30/15 1600 Last data filed at 06/30/15 0830  Gross per 24 hour  Intake  637.5 ml  Output    500 ml  Net  137.5 ml     Physical Exam  Awake Alert, Oriented X 3, No new F.N deficits, Normal affect, ill looking appearing and deconditioned. [place].AT,PERRAL Supple Neck,No JVD, No cervical lymphadenopathy appriciated.  Symmetrical Chest wall movement, Good air movement bilaterally, CTAB RRR,No Gallops,Rubs or new Murmurs, No Parasternal Heave Abdomen soft, mild distended, present bowel sounds, no rebound. No Cyanosis, Clubbing or edema, No new Rash or bruise     Data Review:    CBC  Recent Labs Lab 06/29/15 1805 06/30/15 0201 06/30/15 0433  WBC 12.5* 8.1 7.5  HGB 12.5 10.5* 10.3*  HCT 39.5 34.1* 33.2*  PLT 279 238 245  MCV 84.2 84.6 86.5  MCH 26.7 26.1 26.8  MCHC 31.6 30.8 31.0  RDW 19.3* 18.3* 18.5*  LYMPHSABS 0.7  --   --   MONOABS 0.6  --   --   EOSABS 0.1  --   --   BASOSABS 0.0  --   --     Chemistries   Recent Labs Lab 06/29/15 1805 06/30/15 0201 06/30/15 0433  NA 140  --  144  K 3.2*  --  3.1*  CL 107  --  104  CO2 25  --  25  GLUCOSE 178*  --  151*  BUN 26*  --  21*  CREATININE 1.45* 1.34* 1.40*  CALCIUM 9.2  --  8.7*  AST 23  --  30  ALT 30  --  28  ALKPHOS 157*  --  145*  BILITOT 0.4  --  0.3    ------------------------------------------------------------------------------------------------------------------ No results for input(s): CHOL, HDL, LDLCALC, TRIG, CHOLHDL, LDLDIRECT in the last 72 hours.  No results found for: HGBA1C ------------------------------------------------------------------------------------------------------------------ No results for input(s): TSH, T4TOTAL, T3FREE, THYROIDAB in the last 72 hours.  Invalid input(s): FREET3 ------------------------------------------------------------------------------------------------------------------ No results for input(s): VITAMINB12, FOLATE, FERRITIN, TIBC, IRON, RETICCTPCT in the last 72 hours.  Coagulation profile No results for input(s): INR, PROTIME in the last 168 hours.  No results for input(s): DDIMER in the last 72 hours.  Cardiac Enzymes No results for input(s): CKMB, TROPONINI, MYOGLOBIN in the last 168 hours.  Invalid input(s): CK ------------------------------------------------------------------------------------------------------------------ No results found for: BNP  Inpatient Medications  Scheduled Meds: . enoxaparin (LOVENOX) injection  40 mg Subcutaneous Q24H  . insulin aspart  0-9 Units Subcutaneous Q4H   Continuous Infusions: . 0.9 % NaCl with KCl 20 mEq / L 125 mL/hr at 06/30/15 1024   PRN Meds:.acetaminophen **OR** acetaminophen, labetalol, morphine injection, ondansetron **OR** ondansetron (ZOFRAN) IV  Micro Results No results found for this or any previous visit (from the past 240 hour(s)).  Radiology Reports Dg Abd 1 View  06/15/2015  CLINICAL DATA:  Vomiting brown liquid X 3 days, diarrhea since she got the emergency room today, Hx of acid reflux, hiatal hernia, EXAM: ABDOMEN - 1 VIEW COMPARISON:  06/15/2015 CT scan at 07:41 FINDINGS: Intravenous contrast is seen within the renal collecting systems ureters and bladder. Mildly dilated loops of small bowel again identified with some  gas into the proximal colon and decompressed more distal colon. IMPRESSION: Bowel gas pattern nonspecific radiographically but does again demonstrates some dilated loops of small bowel with the possibility again of small-bowel obstruction. Electronically Signed   By: Skipper Cliche M.D.   On: 06/15/2015 15:32   Ct Abdomen Pelvis W Contrast  06/29/2015  CLINICAL DATA:  Abdominal pain and vomiting today. Recent colitis and small bowel obstruction. Left breast  carcinoma. EXAM: CT ABDOMEN AND PELVIS WITH CONTRAST TECHNIQUE: Multidetector CT imaging of the abdomen and pelvis was performed using the standard protocol following bolus administration of intravenous contrast. CONTRAST:  10mL ISOVUE-300 IOPAMIDOL (ISOVUE-300) INJECTION 61% COMPARISON:  06/15/2015 and 02/01/2007 FINDINGS: Lower chest:  No acute findings. Hepatobiliary: Poorly defined hypovascular mass in the posterior right hepatic lobe measures 3.0 cm on image 15/series 2 and has not significantly changed compared to most recent exam although it is new compared to 2008 exam. This is suspicious for a liver metastasis. No other definite liver masses are identified. Tiny calcified gallstones are again seen. Gallbladder is mildly distended but there is no evidence of wall thickening or pericholecystic inflammatory changes. Pancreas: No mass or other significant abnormality identified. Spleen: Within normal limits in size and appearance. Adrenals/Urinary Tract: Bilateral adrenal masses are again seen measuring 3.5 cm on the right and 3.2 cm on the left. These are unchanged since most recent exam but are new since 2008 and consistent with adrenal metastases. Tiny left renal cysts are again noted however there is no evidence of renal masses. Moderate right hydronephrosis and ureterectasis shows no significant change. Asymmetric wall thickening is again seen along the right lateral and posterior bladder walls. This could be due to neoplasm or cystitis.  Stomach/Bowel: Small to moderate hiatal hernia seen. Multiple moderately dilated small bowel loops are again seen containing air-fluid levels. There is a transition point in the anterior lower abdomen were there is a thickened small bowel loop as well as small less than 1 cm lymph nodes in the adjacent small bowel mesentery. Colonic diverticulosis also noted, without of diverticulitis. Vascular/Lymphatic: Bilateral iliac and retroperitoneal surgical clips seen. No pathologically enlarged lymph nodes identified.No evidence of abdominal aortic aneurysm. Reproductive: Prior hysterectomy noted. Adnexal regions are unremarkable in appearance. Other: None. Musculoskeletal:  No suspicious bone lesions identified. IMPRESSION: Stable appearance of mid small bowel obstruction with transition point in the anterior lower abdomen. Small bowel wall thickening and adjacent sub-cm mesenteric lymph nodes at the transition point may be secondary to metastatic disease, although differential diagnosis also includes ischemic and inflammatory etiologies as well as adhesion. Stable moderate right hydronephrosis and asymmetric bladder wall thickening. This may be due to bladder carcinoma or cystitis. 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 No significant change in 3 cm hypovascular right hepatic lobe mass, suspicious for hepatic metastasis. Cholelithiasis.  No radiographic evidence of cholecystitis. Small to moderate hiatal hernia. Electronically Signed   By: Earle Gell M.D.   On: 06/29/2015 21:17   Ct Abdomen Pelvis W Contrast  06/15/2015  CLINICAL DATA:  Nausea and vomiting for 2 days with right upper quadrant abdominal pain and tenderness EXAM: CT ABDOMEN AND PELVIS WITH CONTRAST TECHNIQUE: Multidetector CT imaging of the abdomen and pelvis was performed using the standard protocol following bolus administration of intravenous contrast. CONTRAST:  10mL ISOVUE-300 IOPAMIDOL  (ISOVUE-300) INJECTION 61% COMPARISON:  02/01/2007 FINDINGS: Lower chest: Postsurgical change from left breast surgery noted. Mitral valve calcification. Visualized portions of the lung bases clear. Hepatobiliary: Cholelithiasis is again identified although there appear to be few or gallstones currently than on the prior study. There is 3 cm low-attenuation lesion in the posterior right lobe of the liver that was not present previously. Pancreas: Normal Spleen: Normal Adrenals/Urinary Tract: A 3.4 cm right adrenal mass present that was not present previously. A 3.1 cm left adrenal mass is present that was not present previously. There is bilateral renal  cortical atrophy. There is no hydronephrosis. Bladder is decompressed. There appears to be bladder wall thickening. Stomach/Bowel: There is a small hiatal hernia. There is significant thickening of the wall of the rectum and distal half of the sigmoid colon. There is mild inflammatory change surrounding the rectum in the presacral region. There is diverticulosis of the sigmoid colon. There is diverticulosis of the descending colon. The appendix is normal. Duodenum is normal but most of the jejunum is distended and demonstrates air-fluid levels. Dilatation is up to a diameter of 3.8 cm. The transition is in the right lower abdomen with decompression of the ileum. Distal loops of decompressed small bowel show fecalized material. Vascular/Lymphatic: Atherosclerotic calcification of the aortoiliac vessels. No significant abdominal or pelvic adenopathy. Reproductive: Uterus not visualized.  No pelvic masses. Other: Subtle focus of hyperattenuation inferior left adnexa into left rectovesical compartment. Significance uncertain with possible causes including mildly prominent pelvic veins or less likely contrast from a perforated sigmoid diverticulum, considered unlikely given absence of free air. Musculoskeletal: No acute musculoskeletal findings. IMPRESSION: 1. Findings  consistent with small-bowel obstruction likely due to adhesions near the junction of the jejunum and ileum 2. Cholelithiasis without secondary sign of cholecystitis by CT scan 3. New masses in the right hepatic lobe and in the bilateral adrenal glands. These could represent metastatic lesions in further evaluation with MRI is recommended. 4. There is diverticulosis involving the descending colon and sigmoid colon. There is wall thickening involving the distal half of the sigmoid colon and more severely, the rectum. Findings suggest distal colitis possibly of infectious origin. 5. Subtle hyperattenuation left adnexal region as discussed above likely representing prominent pelvic veins in the area, but exact etiology and potential relationship to inflamed distal colon uncertain and deserving follow up CT imaging to reassess. Electronically Signed   By: Skipper Cliche M.D.   On: 06/15/2015 08:09   Dg Abd 2 Views  06/16/2015  CLINICAL DATA:  Small bowel obstruction. EXAM: ABDOMEN - 2 VIEW COMPARISON:  June 15, 2015. FINDINGS: The bowel gas pattern is normal. Residual contrast is noted in the colon. Surgical clips are noted in the pelvis and abdomen. No abnormal calcifications are noted. IMPRESSION: No evidence of bowel obstruction or ileus. Electronically Signed   By: Marijo Conception, M.D.   On: 06/16/2015 08:53   Dg Abd Portable 1v-small Bowel Protocol-position Verification  06/30/2015  CLINICAL DATA:  NG tube placement EXAM: PORTABLE ABDOMEN - 1 VIEW COMPARISON:  06/16/2015 FINDINGS: NG tube loops in the fundus of the stomach with the tip in the distal stomach. Single prominent small bowel loop in the left lower abdomen. Oral contrast material noted within decompressed colon. Surgical clips in the lower abdomen and pelvis. IMPRESSION: NG tube tip in the distal stomach. Single prominent loop of small bowel in the left lower abdomen. Electronically Signed   By: Rolm Baptise M.D.   On: 06/30/2015 10:56    Time  Spent in minutes      Tawni Millers M.D on 06/30/2015 at 4:00 PM  Between 7am to 7pm - Pager   After 7pm go to [email or web address] - password [facility]  [facility] -  Office  [phone]

## 2015-07-01 ENCOUNTER — Inpatient Hospital Stay (HOSPITAL_COMMUNITY): Payer: Commercial Managed Care - HMO

## 2015-07-01 DIAGNOSIS — K5669 Other intestinal obstruction: Principal | ICD-10-CM

## 2015-07-01 LAB — BASIC METABOLIC PANEL
ANION GAP: 16 — AB (ref 5–15)
BUN: 18 mg/dL (ref 6–20)
CALCIUM: 8.6 mg/dL — AB (ref 8.9–10.3)
CO2: 24 mmol/L (ref 22–32)
Chloride: 112 mmol/L — ABNORMAL HIGH (ref 101–111)
Creatinine, Ser: 1.24 mg/dL — ABNORMAL HIGH (ref 0.44–1.00)
GFR calc Af Amer: 50 mL/min — ABNORMAL LOW (ref >60)
GFR, EST NON AFRICAN AMERICAN: 43 mL/min — AB (ref >60)
GLUCOSE: 114 mg/dL — AB (ref 65–99)
Potassium: 3.3 mmol/L — ABNORMAL LOW (ref 3.5–5.1)
SODIUM: 152 mmol/L — AB (ref 135–145)

## 2015-07-01 LAB — CBC WITH DIFFERENTIAL/PLATELET
BASOS ABS: 0 10*3/uL (ref 0.0–0.1)
BASOS PCT: 1 %
EOS ABS: 0.2 10*3/uL (ref 0.0–0.7)
EOS PCT: 3 %
HCT: 34.1 % — ABNORMAL LOW (ref 36.0–46.0)
HEMOGLOBIN: 10.3 g/dL — AB (ref 12.0–15.0)
LYMPHS ABS: 0.9 10*3/uL (ref 0.7–4.0)
Lymphocytes Relative: 14 %
MCH: 26.3 pg (ref 26.0–34.0)
MCHC: 30.2 g/dL (ref 30.0–36.0)
MCV: 87.2 fL (ref 78.0–100.0)
Monocytes Absolute: 0.4 10*3/uL (ref 0.1–1.0)
Monocytes Relative: 7 %
NEUTROS PCT: 75 %
Neutro Abs: 4.7 10*3/uL (ref 1.7–7.7)
PLATELETS: 224 10*3/uL (ref 150–400)
RBC: 3.91 MIL/uL (ref 3.87–5.11)
RDW: 18.8 % — ABNORMAL HIGH (ref 11.5–15.5)
WBC: 6.1 10*3/uL (ref 4.0–10.5)

## 2015-07-01 LAB — GLUCOSE, CAPILLARY
GLUCOSE-CAPILLARY: 115 mg/dL — AB (ref 65–99)
Glucose-Capillary: 102 mg/dL — ABNORMAL HIGH (ref 65–99)
Glucose-Capillary: 111 mg/dL — ABNORMAL HIGH (ref 65–99)
Glucose-Capillary: 116 mg/dL — ABNORMAL HIGH (ref 65–99)
Glucose-Capillary: 140 mg/dL — ABNORMAL HIGH (ref 65–99)

## 2015-07-01 LAB — URINE MICROSCOPIC-ADD ON

## 2015-07-01 LAB — URINALYSIS, ROUTINE W REFLEX MICROSCOPIC
BILIRUBIN URINE: NEGATIVE
Glucose, UA: NEGATIVE mg/dL
Hgb urine dipstick: NEGATIVE
Ketones, ur: 15 mg/dL — AB
NITRITE: NEGATIVE
PROTEIN: NEGATIVE mg/dL
SPECIFIC GRAVITY, URINE: 1.024 (ref 1.005–1.030)
pH: 5 (ref 5.0–8.0)

## 2015-07-01 MED ORDER — POTASSIUM CHLORIDE 20 MEQ PO PACK
40.0000 meq | PACK | Freq: Once | ORAL | Status: AC
  Administered 2015-07-01: 40 meq via ORAL
  Filled 2015-07-01: qty 2

## 2015-07-01 MED ORDER — DEXTROSE 5 % IV SOLN
INTRAVENOUS | Status: DC
  Administered 2015-07-01 – 2015-07-02 (×2): via INTRAVENOUS

## 2015-07-01 NOTE — Progress Notes (Signed)
Patient ID: Monica Morrison, female   DOB: 07/26/1945, 70 y.o.   MRN: [id number]     [facility]      [address] [place], [place] [address]    Phone: [phone] FAX: [phone]     Subjective: Having loose BMs.  1931m ngt output.  No pain.    Objective:  Vital signs:  Filed Vitals:   06/30/15 1528 06/30/15 1900 06/30/15 2042 07/01/15 0613  BP: 161/80 149/70 165/89 158/77  Pulse: 81 75 77 81  Temp: 98.4 F (36.9 C) 98.1 F (36.7 C) 98.2 F (36.8 C) 97.9 F (36.6 C)  TempSrc: Oral Oral Oral   Resp: 17 18 18 18   Height:      Weight:      SpO2: 99% 100% 99% 96%    Last BM Date: 07/01/15  Intake/Output   Yesterday:  05/09 0701 - 05/10 0700 In: 2310.4 [P.O.:120; I.V.:2190.4] Out: 1900 [Emesis/NG output:1900] This shift: I/O last 3 completed shifts: In: 2947.9 [P.O.:120; I.V.:2827.9] Out: 2400 [Emesis/NG output:2400]    Physical Exam: General: Pt awake/alert/oriented x4 in no acute distress Abdomen: Soft.  Nondistended.  Non tender.  No evidence of peritonitis.  No incarcerated hernias.    Problem List:   Principal Problem:   Small bowel obstruction (HCC) Active Problems:   Diabetes mellitus with complication (HCC)   LFT elevation   Hypothyroidism   Hypertension   Hypokalemia   Dehydration    Results:   Labs: Results for orders placed or performed during the hospital encounter of 06/29/15 (from the past 48 hour(s))  Comprehensive metabolic panel     Status: Abnormal   Collection Time: 06/29/15  6:05 PM  Result Value Ref Range   Sodium 140 135 - 145 mmol/L   Potassium 3.2 (L) 3.5 - 5.1 mmol/L   Chloride 107 101 - 111 mmol/L   CO2 25 22 - 32 mmol/L   Glucose, Bld 178 (H) 65 - 99 mg/dL   BUN 26 (H) 6 - 20 mg/dL   Creatinine, Ser 1.45 (H) 0.44 - 1.00 mg/dL   Calcium 9.2 8.9 - 10.3 mg/dL   Total Protein 7.9 6.5 - 8.1 g/dL   Albumin 3.7 3.5 - 5.0 g/dL   AST 23 15 - 41 U/L   ALT 30 14 - 54 U/L    Alkaline Phosphatase 157 (H) 38 - 126 U/L   Total Bilirubin 0.4 0.3 - 1.2 mg/dL   GFR calc non Af Amer 36 (L) >60 mL/min   GFR calc Af Amer 41 (L) >60 mL/min    Comment: (NOTE) The eGFR has been calculated using the CKD EPI equation. This calculation has not been validated in all clinical situations. eGFR's persistently <60 mL/min signify possible Chronic Kidney Disease.    Anion gap 8 5 - 15  CBC with Differential     Status: Abnormal   Collection Time: 06/29/15  6:05 PM  Result Value Ref Range   WBC 12.5 (H) 4.0 - 10.5 K/uL   RBC 4.69 3.87 - 5.11 MIL/uL   Hemoglobin 12.5 12.0 - 15.0 g/dL   HCT 39.5 36.0 - 46.0 %   MCV 84.2 78.0 - 100.0 fL   MCH 26.7 26.0 - 34.0 pg   MCHC 31.6 30.0 - 36.0 g/dL   RDW 19.3 (H) 11.5 - 15.5 %   Platelets 279 150 - 400 K/uL   Neutrophils Relative % 89 %   Neutro Abs 11.1 (H) 1.7 - 7.7 K/uL  Lymphocytes Relative 6 %   Lymphs Abs 0.7 0.7 - 4.0 K/uL   Monocytes Relative 4 %   Monocytes Absolute 0.6 0.1 - 1.0 K/uL   Eosinophils Relative 1 %   Eosinophils Absolute 0.1 0.0 - 0.7 K/uL   Basophils Relative 0 %   Basophils Absolute 0.0 0.0 - 0.1 K/uL  Lipase, blood     Status: None   Collection Time: 06/29/15  6:05 PM  Result Value Ref Range   Lipase 29 11 - 51 U/L  Glucose, capillary     Status: Abnormal   Collection Time: 06/30/15  1:47 AM  Result Value Ref Range   Glucose-Capillary 146 (H) 65 - 99 mg/dL  CBC     Status: Abnormal   Collection Time: 06/30/15  2:01 AM  Result Value Ref Range   WBC 8.1 4.0 - 10.5 K/uL   RBC 4.03 3.87 - 5.11 MIL/uL   Hemoglobin 10.5 (L) 12.0 - 15.0 g/dL   HCT 34.1 (L) 36.0 - 46.0 %   MCV 84.6 78.0 - 100.0 fL   MCH 26.1 26.0 - 34.0 pg   MCHC 30.8 30.0 - 36.0 g/dL   RDW 18.3 (H) 11.5 - 15.5 %   Platelets 238 150 - 400 K/uL  Creatinine, serum     Status: Abnormal   Collection Time: 06/30/15  2:01 AM  Result Value Ref Range   Creatinine, Ser 1.34 (H) 0.44 - 1.00 mg/dL   GFR calc non Af Amer 39 (L) >60 mL/min    GFR calc Af Amer 45 (L) >60 mL/min    Comment: (NOTE) The eGFR has been calculated using the CKD EPI equation. This calculation has not been validated in all clinical situations. eGFR's persistently <60 mL/min signify possible Chronic Kidney Disease.   Comprehensive metabolic panel     Status: Abnormal   Collection Time: 06/30/15  4:33 AM  Result Value Ref Range   Sodium 144 135 - 145 mmol/L   Potassium 3.1 (L) 3.5 - 5.1 mmol/L   Chloride 104 101 - 111 mmol/L   CO2 25 22 - 32 mmol/L   Glucose, Bld 151 (H) 65 - 99 mg/dL   BUN 21 (H) 6 - 20 mg/dL   Creatinine, Ser 1.40 (H) 0.44 - 1.00 mg/dL   Calcium 8.7 (L) 8.9 - 10.3 mg/dL   Total Protein 5.9 (L) 6.5 - 8.1 g/dL   Albumin 2.8 (L) 3.5 - 5.0 g/dL   AST 30 15 - 41 U/L   ALT 28 14 - 54 U/L   Alkaline Phosphatase 145 (H) 38 - 126 U/L   Total Bilirubin 0.3 0.3 - 1.2 mg/dL   GFR calc non Af Amer 37 (L) >60 mL/min   GFR calc Af Amer 43 (L) >60 mL/min    Comment: (NOTE) The eGFR has been calculated using the CKD EPI equation. This calculation has not been validated in all clinical situations. eGFR's persistently <60 mL/min signify possible Chronic Kidney Disease.    Anion gap 15 5 - 15  CBC     Status: Abnormal   Collection Time: 06/30/15  4:33 AM  Result Value Ref Range   WBC 7.5 4.0 - 10.5 K/uL   RBC 3.84 (L) 3.87 - 5.11 MIL/uL   Hemoglobin 10.3 (L) 12.0 - 15.0 g/dL   HCT 33.2 (L) 36.0 - 46.0 %   MCV 86.5 78.0 - 100.0 fL   MCH 26.8 26.0 - 34.0 pg   MCHC 31.0 30.0 - 36.0 g/dL   RDW 18.5 (  H) 11.5 - 15.5 %   Platelets 245 150 - 400 K/uL  Glucose, capillary     Status: Abnormal   Collection Time: 06/30/15  4:58 AM  Result Value Ref Range   Glucose-Capillary 141 (H) 65 - 99 mg/dL  Glucose, capillary     Status: Abnormal   Collection Time: 06/30/15  7:56 AM  Result Value Ref Range   Glucose-Capillary 128 (H) 65 - 99 mg/dL   Comment 1 Notify RN   Glucose, capillary     Status: Abnormal   Collection Time: 06/30/15 12:04 PM   Result Value Ref Range   Glucose-Capillary 151 (H) 65 - 99 mg/dL   Comment 1 Notify RN   Glucose, capillary     Status: None   Collection Time: 06/30/15  4:00 PM  Result Value Ref Range   Glucose-Capillary 85 65 - 99 mg/dL   Comment 1 Notify RN   Glucose, capillary     Status: None   Collection Time: 06/30/15  8:02 PM  Result Value Ref Range   Glucose-Capillary 90 65 - 99 mg/dL   Comment 1 Notify RN    Comment 2 Document in Chart   Glucose, capillary     Status: Abnormal   Collection Time: 07/01/15 12:11 AM  Result Value Ref Range   Glucose-Capillary 115 (H) 65 - 99 mg/dL   Comment 1 Notify RN    Comment 2 Document in Chart   Urinalysis, Routine w reflex microscopic (not at [facility])     Status: Abnormal   Collection Time: 07/01/15  3:18 AM  Result Value Ref Range   Color, Urine YELLOW YELLOW   APPearance CLEAR CLEAR   Specific Gravity, Urine 1.024 1.005 - 1.030   pH 5.0 5.0 - 8.0   Glucose, UA NEGATIVE NEGATIVE mg/dL   Hgb urine dipstick NEGATIVE NEGATIVE   Bilirubin Urine NEGATIVE NEGATIVE   Ketones, ur 15 (A) NEGATIVE mg/dL   Protein, ur NEGATIVE NEGATIVE mg/dL   Nitrite NEGATIVE NEGATIVE   Leukocytes, UA SMALL (A) NEGATIVE  Urine microscopic-add on     Status: Abnormal   Collection Time: 07/01/15  3:18 AM  Result Value Ref Range   Squamous Epithelial / LPF 0-5 (A) NONE SEEN   WBC, UA 6-30 0 - 5 WBC/hpf   RBC / HPF 0-5 0 - 5 RBC/hpf   Bacteria, UA RARE (A) NONE SEEN   Casts HYALINE CASTS (A) NEGATIVE  Glucose, capillary     Status: Abnormal   Collection Time: 07/01/15  4:21 AM  Result Value Ref Range   Glucose-Capillary 102 (H) 65 - 99 mg/dL   Comment 1 Notify RN    Comment 2 Document in Chart   CBC with Differential/Platelet     Status: Abnormal   Collection Time: 07/01/15  4:30 AM  Result Value Ref Range   WBC 6.1 4.0 - 10.5 K/uL   RBC 3.91 3.87 - 5.11 MIL/uL   Hemoglobin 10.3 (L) 12.0 - 15.0 g/dL   HCT 34.1 (L) 36.0 - 46.0 %   MCV 87.2 78.0 - 100.0 fL   MCH  26.3 26.0 - 34.0 pg   MCHC 30.2 30.0 - 36.0 g/dL   RDW 18.8 (H) 11.5 - 15.5 %   Platelets 224 150 - 400 K/uL   Neutrophils Relative % 75 %   Neutro Abs 4.7 1.7 - 7.7 K/uL   Lymphocytes Relative 14 %   Lymphs Abs 0.9 0.7 - 4.0 K/uL   Monocytes Relative 7 %  Monocytes Absolute 0.4 0.1 - 1.0 K/uL   Eosinophils Relative 3 %   Eosinophils Absolute 0.2 0.0 - 0.7 K/uL   Basophils Relative 1 %   Basophils Absolute 0.0 0.0 - 0.1 K/uL  Basic metabolic panel     Status: Abnormal   Collection Time: 07/01/15  4:30 AM  Result Value Ref Range   Sodium 152 (H) 135 - 145 mmol/L    Comment: DELTA CHECK NOTED   Potassium 3.3 (L) 3.5 - 5.1 mmol/L   Chloride 112 (H) 101 - 111 mmol/L   CO2 24 22 - 32 mmol/L   Glucose, Bld 114 (H) 65 - 99 mg/dL   BUN 18 6 - 20 mg/dL   Creatinine, Ser 1.24 (H) 0.44 - 1.00 mg/dL   Calcium 8.6 (L) 8.9 - 10.3 mg/dL   GFR calc non Af Amer 43 (L) >60 mL/min   GFR calc Af Amer 50 (L) >60 mL/min    Comment: (NOTE) The eGFR has been calculated using the CKD EPI equation. This calculation has not been validated in all clinical situations. eGFR's persistently <60 mL/min signify possible Chronic Kidney Disease.    Anion gap 16 (H) 5 - 15    Imaging / Studies: Ct Abdomen Pelvis W Contrast  06/29/2015  CLINICAL DATA:  Abdominal pain and vomiting today. Recent colitis and small bowel obstruction. Left breast carcinoma. EXAM: CT ABDOMEN AND PELVIS WITH CONTRAST TECHNIQUE: Multidetector CT imaging of the abdomen and pelvis was performed using the standard protocol following bolus administration of intravenous contrast. CONTRAST:  29m ISOVUE-300 IOPAMIDOL (ISOVUE-300) INJECTION 61% COMPARISON:  06/15/2015 and 02/01/2007 FINDINGS: Lower chest:  No acute findings. Hepatobiliary: Poorly defined hypovascular mass in the posterior right hepatic lobe measures 3.0 cm on image 15/series 2 and has not significantly changed compared to most recent exam although it is new compared to 2008 exam.  This is suspicious for a liver metastasis. No other definite liver masses are identified. Tiny calcified gallstones are again seen. Gallbladder is mildly distended but there is no evidence of wall thickening or pericholecystic inflammatory changes. Pancreas: No mass or other significant abnormality identified. Spleen: Within normal limits in size and appearance. Adrenals/Urinary Tract: Bilateral adrenal masses are again seen measuring 3.5 cm on the right and 3.2 cm on the left. These are unchanged since most recent exam but are new since 2008 and consistent with adrenal metastases. Tiny left renal cysts are again noted however there is no evidence of renal masses. Moderate right hydronephrosis and ureterectasis shows no significant change. Asymmetric wall thickening is again seen along the right lateral and posterior bladder walls. This could be due to neoplasm or cystitis. Stomach/Bowel: Small to moderate hiatal hernia seen. Multiple moderately dilated small bowel loops are again seen containing air-fluid levels. There is a transition point in the anterior lower abdomen were there is a thickened small bowel loop as well as small less than 1 cm lymph nodes in the adjacent small bowel mesentery. Colonic diverticulosis also noted, without of diverticulitis. Vascular/Lymphatic: Bilateral iliac and retroperitoneal surgical clips seen. No pathologically enlarged lymph nodes identified.No evidence of abdominal aortic aneurysm. Reproductive: Prior hysterectomy noted. Adnexal regions are unremarkable in appearance. Other: None. Musculoskeletal:  No suspicious bone lesions identified. IMPRESSION: Stable appearance of mid small bowel obstruction with transition point in the anterior lower abdomen. Small bowel wall thickening and adjacent sub-cm mesenteric lymph nodes at the transition point may be secondary to metastatic disease, although differential diagnosis also includes ischemic and inflammatory etiologies as well  as  adhesion. Stable moderate right hydronephrosis and asymmetric bladder wall thickening. This may be due to bladder carcinoma or cystitis. 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 No significant change in 3 cm hypovascular right hepatic lobe mass, suspicious for hepatic metastasis. Cholelithiasis.  No radiographic evidence of cholecystitis. Small to moderate hiatal hernia. Electronically Signed   By: Earle Gell M.D.   On: 06/29/2015 21:17   Dg Abd Portable 1v-small Bowel Obstruction Protocol-initial, 8 Hr Delay  06/30/2015  CLINICAL DATA:  Small bowel obstruction.  8 hour delayed film. EXAM: PORTABLE ABDOMEN - 1 VIEW COMPARISON:  Abdominal radiograph prior today and CT on 06/29/2015 FINDINGS: Nasogastric tube is again seen within the stomach. Oral contrast is seen within the colon from prior CT. In addition, there is increased oral contrast material in mildly dilated small bowel loops in the right and mid abdomen. This is suspicious for a partial small bowel obstruction. IMPRESSION: Findings suspicious for partial small bowel obstruction. Nasogastric tube in stomach. Electronically Signed   By: Earle Gell M.D.   On: 06/30/2015 19:45   Dg Abd Portable 1v-small Bowel Protocol-position Verification  06/30/2015  CLINICAL DATA:  NG tube placement EXAM: PORTABLE ABDOMEN - 1 VIEW COMPARISON:  06/16/2015 FINDINGS: NG tube loops in the fundus of the stomach with the tip in the distal stomach. Single prominent small bowel loop in the left lower abdomen. Oral contrast material noted within decompressed colon. Surgical clips in the lower abdomen and pelvis. IMPRESSION: NG tube tip in the distal stomach. Single prominent loop of small bowel in the left lower abdomen. Electronically Signed   By: Rolm Baptise M.D.   On: 06/30/2015 10:56    Medications / Allergies:  Scheduled Meds: . enoxaparin (LOVENOX) injection  40 mg Subcutaneous Q24H  . insulin aspart  0-9 Units  Subcutaneous Q4H   Continuous Infusions: . 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  Looks partially obstructed; high ngt output and having diarrhea.  Etiology unclear, adhesions versus other process.  CT raised question of lymphadenopathy.  May require a diagnostic laparoscopy possible laparotomy.   FEN-IVF, NPO, consider changing IVF to D5 to avoid hypoglycemia  VTE prophylaxis-SCD, lovenox Dispo-AXR   Erby Pian, ANP-BC [facility] Pager [phone]([id number]) For consults and floor pages call [phone]([id number])  07/01/2015 9:06 AM

## 2015-07-01 NOTE — Progress Notes (Signed)
[facility] Progress Note                                            Patient Demographics:    Monica Morrison, is a 70 y.o. female, DOB - 1945-10-03, [id number]  Admit date - 06/29/2015   Admitting Physician Rise Patience, MD  Outpatient Primary MD for the patient is Gara Kroner, MD  LOS - 2  Outpatient Specialists:  Chief Complaint  Patient presents with  . Diarrhea  . Emesis        Subjective:    Monica Morrison. Improved abdominal pain, no nausea or vomiting, positive bowel movements, ng tube still in place. No chest pain or dyspnea.    Principal Problem:   Small bowel obstruction (HCC) Active Problems:   Diabetes mellitus with complication (HCC)   LFT elevation   Hypothyroidism   Hypertension   Hypokalemia   Dehydration    1. CV. Blood pressure 0000000 to [id number] systolic, will continue maintenance IV fluids. No signs of volume overload.  2. Pulmonary Will continue IV fluids and monitor oxymetry, patient with no signs of volume overload, will continue 02 per [place] to target 02 sat above 92%  3. Nephrology Na up to 152, will change IV fluids to d5 water at 75 cc/hr, will follow on renal panel in am, avoid hypotension and nephrotoxic medications. Will continue to replete k with po kcl.  4. Gastroenterology. Will continue conservative care, will continue ng for now, will follow on surgery recommendations, for clamping tube.  5. Endocrinology.  Serum glucose [id number]. Will continue insulin sliding scale for coverage.  Code Status :   Family Communication  :   Disposition Plan  :   Barriers For Discharge :   Consults  :  surgery  Procedures  :   DVT  Prophylaxis  :  Lovenox   Lab Results  Component Value Date   PLT 224 07/01/2015    Antibiotics  :   Anti-infectives    None        Objective:   Filed Vitals:   06/30/15 2042 07/01/15 0613 07/01/15 1106 07/01/15 1427  BP: 165/89 158/77 162/79 165/77  Pulse: 77 81 80 85  Temp: 98.2 F (36.8 C) 97.9 F (36.6 C) 97.5 F (36.4 C) 97.8 F (36.6 C)  TempSrc: Oral  Oral Oral  Resp: 18 18 18 18   Height:      Weight:      SpO2: 99% 96% 100% 100%    Wt Readings from Last 3 Encounters:  06/29/15 77.111 kg (170 lb)  06/15/15 77.111 kg (170 lb)  02/09/15 78.382 kg (172 lb 12.8 oz)     Intake/Output Summary (Last 24 hours) at  07/01/15 1450 Last data filed at 07/01/15 [id number]  Gross per 24 hour  Intake 2310.42 ml  Output   1900 ml  Net 410.42 ml     Physical Exam  Awake Alert, Oriented X 3, No new F.N deficits, Normal affect, deconditioned. [place].AT,PERRAL Supple Neck,No JVD, No cervical lymphadenopathy appriciated.  Symmetrical Chest wall movement, poor inspiratory effort, CTAB RRR,No Gallops,Rubs or new Murmurs, No Parasternal Heave +ve B.Sounds, Abd Soft, mild tenderness, No organomegaly appriciated, No rebound, no significant distention No Cyanosis, Clubbing or edema, No new Rash or bruise     Data Review:    CBC  Recent Labs Lab 06/29/15 1805 06/30/15 0201 06/30/15 0433 07/01/15 0430  WBC 12.5* 8.1 7.5 6.1  HGB 12.5 10.5* 10.3* 10.3*  HCT 39.5 34.1* 33.2* 34.1*  PLT 279 238 245 224  MCV 84.2 84.6 86.5 87.2  MCH 26.7 26.1 26.8 26.3  MCHC 31.6 30.8 31.0 30.2  RDW 19.3* 18.3* 18.5* 18.8*  LYMPHSABS 0.7  --   --  0.9  MONOABS 0.6  --   --  0.4  EOSABS 0.1  --   --  0.2  BASOSABS 0.0  --   --  0.0    Chemistries   Recent Labs Lab 06/29/15 1805 06/30/15 0201 06/30/15 0433 07/01/15 0430  NA 140  --  144 152*  K 3.2*  --  3.1* 3.3*  CL 107  --  104 112*  CO2 25  --  25 24  GLUCOSE 178*  --  151* 114*  BUN 26*  --  21* 18  CREATININE 1.45*  1.34* 1.40* 1.24*  CALCIUM 9.2  --  8.7* 8.6*  AST 23  --  30  --   ALT 30  --  28  --   ALKPHOS 157*  --  145*  --   BILITOT 0.4  --  0.3  --    ------------------------------------------------------------------------------------------------------------------ No results for input(s): CHOL, HDL, LDLCALC, TRIG, CHOLHDL, LDLDIRECT in the last 72 hours.  No results found for: HGBA1C ------------------------------------------------------------------------------------------------------------------ No results for input(s): TSH, T4TOTAL, T3FREE, THYROIDAB in the last 72 hours.  Invalid input(s): FREET3 ------------------------------------------------------------------------------------------------------------------ No results for input(s): VITAMINB12, FOLATE, FERRITIN, TIBC, IRON, RETICCTPCT in the last 72 hours.  Coagulation profile No results for input(s): INR, PROTIME in the last 168 hours.  No results for input(s): DDIMER in the last 72 hours.  Cardiac Enzymes No results for input(s): CKMB, TROPONINI, MYOGLOBIN in the last 168 hours.  Invalid input(s): CK ------------------------------------------------------------------------------------------------------------------ No results found for: BNP  Inpatient Medications  Scheduled Meds: . enoxaparin (LOVENOX) injection  40 mg Subcutaneous Q24H  . insulin aspart  0-9 Units Subcutaneous Q4H   Continuous Infusions: . sodium chloride 75 mL/hr at 07/01/15 0559   PRN Meds:.acetaminophen **OR** acetaminophen, labetalol, morphine injection, ondansetron **OR** ondansetron (ZOFRAN) IV  Micro Results No results found for this or any previous visit (from the past 240 hour(s)).  Radiology Reports Dg Abd 1 View  06/15/2015  CLINICAL DATA:  Vomiting brown liquid X 3 days, diarrhea since she got the emergency room today, Hx of acid reflux, hiatal hernia, EXAM: ABDOMEN - 1 VIEW COMPARISON:  06/15/2015 CT scan at 07:41 FINDINGS: Intravenous  contrast is seen within the renal collecting systems ureters and bladder. Mildly dilated loops of small bowel again identified with some gas into the proximal colon and decompressed more distal colon. IMPRESSION: Bowel gas pattern nonspecific radiographically but does again demonstrates some dilated loops of small bowel with the possibility again of  small-bowel obstruction. Electronically Signed   By: Skipper Cliche M.D.   On: 06/15/2015 15:32   Ct Abdomen Pelvis W Contrast  06/29/2015  CLINICAL DATA:  Abdominal pain and vomiting today. Recent colitis and small bowel obstruction. Left breast carcinoma. EXAM: CT ABDOMEN AND PELVIS WITH CONTRAST TECHNIQUE: Multidetector CT imaging of the abdomen and pelvis was performed using the standard protocol following bolus administration of intravenous contrast. CONTRAST:  72mL ISOVUE-300 IOPAMIDOL (ISOVUE-300) INJECTION 61% COMPARISON:  06/15/2015 and 02/01/2007 FINDINGS: Lower chest:  No acute findings. Hepatobiliary: Poorly defined hypovascular mass in the posterior right hepatic lobe measures 3.0 cm on image 15/series 2 and has not significantly changed compared to most recent exam although it is new compared to 2008 exam. This is suspicious for a liver metastasis. No other definite liver masses are identified. Tiny calcified gallstones are again seen. Gallbladder is mildly distended but there is no evidence of wall thickening or pericholecystic inflammatory changes. Pancreas: No mass or other significant abnormality identified. Spleen: Within normal limits in size and appearance. Adrenals/Urinary Tract: Bilateral adrenal masses are again seen measuring 3.5 cm on the right and 3.2 cm on the left. These are unchanged since most recent exam but are new since 2008 and consistent with adrenal metastases. Tiny left renal cysts are again noted however there is no evidence of renal masses. Moderate right hydronephrosis and ureterectasis shows no significant change. Asymmetric  wall thickening is again seen along the right lateral and posterior bladder walls. This could be due to neoplasm or cystitis. Stomach/Bowel: Small to moderate hiatal hernia seen. Multiple moderately dilated small bowel loops are again seen containing air-fluid levels. There is a transition point in the anterior lower abdomen were there is a thickened small bowel loop as well as small less than 1 cm lymph nodes in the adjacent small bowel mesentery. Colonic diverticulosis also noted, without of diverticulitis. Vascular/Lymphatic: Bilateral iliac and retroperitoneal surgical clips seen. No pathologically enlarged lymph nodes identified.No evidence of abdominal aortic aneurysm. Reproductive: Prior hysterectomy noted. Adnexal regions are unremarkable in appearance. Other: None. Musculoskeletal:  No suspicious bone lesions identified. IMPRESSION: Stable appearance of mid small bowel obstruction with transition point in the anterior lower abdomen. Small bowel wall thickening and adjacent sub-cm mesenteric lymph nodes at the transition point may be secondary to metastatic disease, although differential diagnosis also includes ischemic and inflammatory etiologies as well as adhesion. Stable moderate right hydronephrosis and asymmetric bladder wall thickening. This may be due to bladder carcinoma or cystitis. 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 No significant change in 3 cm hypovascular right hepatic lobe mass, suspicious for hepatic metastasis. Cholelithiasis.  No radiographic evidence of cholecystitis. Small to moderate hiatal hernia. Electronically Signed   By: Earle Gell M.D.   On: 06/29/2015 21:17   Ct Abdomen Pelvis W Contrast  06/15/2015  CLINICAL DATA:  Nausea and vomiting for 2 days with right upper quadrant abdominal pain and tenderness EXAM: CT ABDOMEN AND PELVIS WITH CONTRAST TECHNIQUE: Multidetector CT imaging of the abdomen and pelvis was performed  using the standard protocol following bolus administration of intravenous contrast. CONTRAST:  11mL ISOVUE-300 IOPAMIDOL (ISOVUE-300) INJECTION 61% COMPARISON:  02/01/2007 FINDINGS: Lower chest: Postsurgical change from left breast surgery noted. Mitral valve calcification. Visualized portions of the lung bases clear. Hepatobiliary: Cholelithiasis is again identified although there appear to be few or gallstones currently than on the prior study. There is 3 cm low-attenuation lesion in the posterior right lobe of  the liver that was not present previously. Pancreas: Normal Spleen: Normal Adrenals/Urinary Tract: A 3.4 cm right adrenal mass present that was not present previously. A 3.1 cm left adrenal mass is present that was not present previously. There is bilateral renal cortical atrophy. There is no hydronephrosis. Bladder is decompressed. There appears to be bladder wall thickening. Stomach/Bowel: There is a small hiatal hernia. There is significant thickening of the wall of the rectum and distal half of the sigmoid colon. There is mild inflammatory change surrounding the rectum in the presacral region. There is diverticulosis of the sigmoid colon. There is diverticulosis of the descending colon. The appendix is normal. Duodenum is normal but most of the jejunum is distended and demonstrates air-fluid levels. Dilatation is up to a diameter of 3.8 cm. The transition is in the right lower abdomen with decompression of the ileum. Distal loops of decompressed small bowel show fecalized material. Vascular/Lymphatic: Atherosclerotic calcification of the aortoiliac vessels. No significant abdominal or pelvic adenopathy. Reproductive: Uterus not visualized.  No pelvic masses. Other: Subtle focus of hyperattenuation inferior left adnexa into left rectovesical compartment. Significance uncertain with possible causes including mildly prominent pelvic veins or less likely contrast from a perforated sigmoid diverticulum,  considered unlikely given absence of free air. Musculoskeletal: No acute musculoskeletal findings. IMPRESSION: 1. Findings consistent with small-bowel obstruction likely due to adhesions near the junction of the jejunum and ileum 2. Cholelithiasis without secondary sign of cholecystitis by CT scan 3. New masses in the right hepatic lobe and in the bilateral adrenal glands. These could represent metastatic lesions in further evaluation with MRI is recommended. 4. There is diverticulosis involving the descending colon and sigmoid colon. There is wall thickening involving the distal half of the sigmoid colon and more severely, the rectum. Findings suggest distal colitis possibly of infectious origin. 5. Subtle hyperattenuation left adnexal region as discussed above likely representing prominent pelvic veins in the area, but exact etiology and potential relationship to inflamed distal colon uncertain and deserving follow up CT imaging to reassess. Electronically Signed   By: Skipper Cliche M.D.   On: 06/15/2015 08:09   Dg Abd 2 Views  07/01/2015  CLINICAL DATA:  Abdominal pain EXAM: ABDOMEN - 2 VIEW COMPARISON:  06/30/2015 FINDINGS: NG tube remains coiled in the stomach. There is contrast throughout the colon. Postop changes are noted. No disproportionate dilatation of bowel. There is no free intraperitoneal gas. Fatty tissue is seen surrounding the right lobe of the liver. There are nonspecific air-fluid levels in non-dilated bowel. IMPRESSION: Resolved small bowel obstruction pattern. No free intraperitoneal gas. Electronically Signed   By: Marybelle Killings M.D.   On: 07/01/2015 09:47   Dg Abd 2 Views  06/16/2015  CLINICAL DATA:  Small bowel obstruction. EXAM: ABDOMEN - 2 VIEW COMPARISON:  June 15, 2015. FINDINGS: The bowel gas pattern is normal. Residual contrast is noted in the colon. Surgical clips are noted in the pelvis and abdomen. No abnormal calcifications are noted. IMPRESSION: No evidence of bowel  obstruction or ileus. Electronically Signed   By: Marijo Conception, M.D.   On: 06/16/2015 08:53   Dg Abd Portable 1v-small Bowel Obstruction Protocol-initial, 8 Hr Delay  06/30/2015  CLINICAL DATA:  Small bowel obstruction.  8 hour delayed film. EXAM: PORTABLE ABDOMEN - 1 VIEW COMPARISON:  Abdominal radiograph prior today and CT on 06/29/2015 FINDINGS: Nasogastric tube is again seen within the stomach. Oral contrast is seen within the colon from prior CT. In addition, there is increased  oral contrast material in mildly dilated small bowel loops in the right and mid abdomen. This is suspicious for a partial small bowel obstruction. IMPRESSION: Findings suspicious for partial small bowel obstruction. Nasogastric tube in stomach. Electronically Signed   By: Earle Gell M.D.   On: 06/30/2015 19:45   Dg Abd Portable 1v-small Bowel Protocol-position Verification  06/30/2015  CLINICAL DATA:  NG tube placement EXAM: PORTABLE ABDOMEN - 1 VIEW COMPARISON:  06/16/2015 FINDINGS: NG tube loops in the fundus of the stomach with the tip in the distal stomach. Single prominent small bowel loop in the left lower abdomen. Oral contrast material noted within decompressed colon. Surgical clips in the lower abdomen and pelvis. IMPRESSION: NG tube tip in the distal stomach. Single prominent loop of small bowel in the left lower abdomen. Electronically Signed   By: Rolm Baptise M.D.   On: 06/30/2015 10:56    Time Spent in minutes     Jimmy Picket Elijha Dedman M.D on 07/01/2015 at 2:50 PM  Between 7am to 7pm - Pager   After 7pm go to [email or web address] - password [facility]  [facility] -  Office  [phone]

## 2015-07-02 LAB — BASIC METABOLIC PANEL
ANION GAP: 14 (ref 5–15)
BUN: 10 mg/dL (ref 6–20)
CALCIUM: 8.4 mg/dL — AB (ref 8.9–10.3)
CO2: 24 mmol/L (ref 22–32)
Chloride: 105 mmol/L (ref 101–111)
Creatinine, Ser: 1.11 mg/dL — ABNORMAL HIGH (ref 0.44–1.00)
GFR calc non Af Amer: 49 mL/min — ABNORMAL LOW (ref >60)
GFR, EST AFRICAN AMERICAN: 57 mL/min — AB (ref >60)
Glucose, Bld: 183 mg/dL — ABNORMAL HIGH (ref 65–99)
Potassium: 3 mmol/L — ABNORMAL LOW (ref 3.5–5.1)
Sodium: 143 mmol/L (ref 135–145)

## 2015-07-02 LAB — GLUCOSE, CAPILLARY
GLUCOSE-CAPILLARY: 148 mg/dL — AB (ref 65–99)
GLUCOSE-CAPILLARY: 177 mg/dL — AB (ref 65–99)
GLUCOSE-CAPILLARY: 193 mg/dL — AB (ref 65–99)
GLUCOSE-CAPILLARY: 250 mg/dL — AB (ref 65–99)
Glucose-Capillary: 109 mg/dL — ABNORMAL HIGH (ref 65–99)
Glucose-Capillary: 164 mg/dL — ABNORMAL HIGH (ref 65–99)
Glucose-Capillary: 242 mg/dL — ABNORMAL HIGH (ref 65–99)

## 2015-07-02 LAB — CBC WITH DIFFERENTIAL/PLATELET
BASOS ABS: 0 10*3/uL (ref 0.0–0.1)
BASOS PCT: 0 %
Eosinophils Absolute: 0.1 10*3/uL (ref 0.0–0.7)
Eosinophils Relative: 1 %
HEMATOCRIT: 32.6 % — AB (ref 36.0–46.0)
HEMOGLOBIN: 10.2 g/dL — AB (ref 12.0–15.0)
Lymphocytes Relative: 9 %
Lymphs Abs: 0.8 10*3/uL (ref 0.7–4.0)
MCH: 26.9 pg (ref 26.0–34.0)
MCHC: 31.3 g/dL (ref 30.0–36.0)
MCV: 86 fL (ref 78.0–100.0)
MONOS PCT: 9 %
Monocytes Absolute: 0.8 10*3/uL (ref 0.1–1.0)
NEUTROS ABS: 6.5 10*3/uL (ref 1.7–7.7)
NEUTROS PCT: 81 %
Platelets: 202 10*3/uL (ref 150–400)
RBC: 3.79 MIL/uL — AB (ref 3.87–5.11)
RDW: 18.6 % — ABNORMAL HIGH (ref 11.5–15.5)
WBC: 8.2 10*3/uL (ref 4.0–10.5)

## 2015-07-02 MED ORDER — POTASSIUM CHLORIDE 20 MEQ PO PACK
40.0000 meq | PACK | ORAL | Status: AC
  Administered 2015-07-02: 40 meq via ORAL
  Filled 2015-07-02 (×4): qty 2

## 2015-07-02 NOTE — Progress Notes (Signed)
Patient ID: Monica Morrison, female   DOB: 1945/05/23, 70 y.o.   MRN: [id number]     [facility]      [address] [place], [place] [address]    Phone: [phone] FAX: [phone]     Subjective: Multiple BMs yesterday.  Passing flatus.  No n/v. Tolerated ngt being clamped.  Objective:  Vital signs:  Filed Vitals:   07/01/15 0613 07/01/15 1106 07/01/15 1427 07/01/15 2318  BP: 158/77 162/79 165/77 155/64  Pulse: 81 80 85 85  Temp: 97.9 F (36.6 C) 97.5 F (36.4 C) 97.8 F (36.6 C) 98.4 F (36.9 C)  TempSrc:  Oral Oral Oral  Resp: 18 18 18 18   Height:      Weight:      SpO2: 96% 100% 100% 98%    Last BM Date: 07/01/15  Intake/Output   Yesterday:  05/10 0701 - 05/11 0700 In: 1093.8 [I.V.:1093.8] Out: 450 [Emesis/NG output:450] This shift:  Total I/O In: 150 [I.V.:150] Out: 0   Physical Exam: General: Pt awake/alert/oriented x4 in no acute distress Abdomen: Soft. Nondistended. Non tender. No evidence of peritonitis. No incarcerated hernias.   Problem List:   Principal Problem:   Small bowel obstruction (HCC) Active Problems:   Diabetes mellitus with complication (HCC)   LFT elevation   Hypothyroidism   Hypertension   Hypokalemia   Dehydration    Results:   Labs: Results for orders placed or performed during the hospital encounter of 06/29/15 (from the past 48 hour(s))  Glucose, capillary     Status: Abnormal   Collection Time: 06/30/15 12:04 PM  Result Value Ref Range   Glucose-Capillary 151 (H) 65 - 99 mg/dL   Comment 1 Notify RN   Glucose, capillary     Status: None   Collection Time: 06/30/15  4:00 PM  Result Value Ref Range   Glucose-Capillary 85 65 - 99 mg/dL   Comment 1 Notify RN   Glucose, capillary     Status: None   Collection Time: 06/30/15  8:02 PM  Result Value Ref Range   Glucose-Capillary 90 65 - 99 mg/dL   Comment 1 Notify RN    Comment 2 Document in Chart   Glucose,  capillary     Status: Abnormal   Collection Time: 07/01/15 12:11 AM  Result Value Ref Range   Glucose-Capillary 115 (H) 65 - 99 mg/dL   Comment 1 Notify RN    Comment 2 Document in Chart   Urinalysis, Routine w reflex microscopic (not at [facility])     Status: Abnormal   Collection Time: 07/01/15  3:18 AM  Result Value Ref Range   Color, Urine YELLOW YELLOW   APPearance CLEAR CLEAR   Specific Gravity, Urine 1.024 1.005 - 1.030   pH 5.0 5.0 - 8.0   Glucose, UA NEGATIVE NEGATIVE mg/dL   Hgb urine dipstick NEGATIVE NEGATIVE   Bilirubin Urine NEGATIVE NEGATIVE   Ketones, ur 15 (A) NEGATIVE mg/dL   Protein, ur NEGATIVE NEGATIVE mg/dL   Nitrite NEGATIVE NEGATIVE   Leukocytes, UA SMALL (A) NEGATIVE  Urine microscopic-add on     Status: Abnormal   Collection Time: 07/01/15  3:18 AM  Result Value Ref Range   Squamous Epithelial / LPF 0-5 (A) NONE SEEN   WBC, UA 6-30 0 - 5 WBC/hpf   RBC / HPF 0-5 0 - 5 RBC/hpf   Bacteria, UA RARE (A) NONE SEEN   Casts HYALINE CASTS (A) NEGATIVE  Glucose,  capillary     Status: Abnormal   Collection Time: 07/01/15  4:21 AM  Result Value Ref Range   Glucose-Capillary 102 (H) 65 - 99 mg/dL   Comment 1 Notify RN    Comment 2 Document in Chart   CBC with Differential/Platelet     Status: Abnormal   Collection Time: 07/01/15  4:30 AM  Result Value Ref Range   WBC 6.1 4.0 - 10.5 K/uL   RBC 3.91 3.87 - 5.11 MIL/uL   Hemoglobin 10.3 (L) 12.0 - 15.0 g/dL   HCT 34.1 (L) 36.0 - 46.0 %   MCV 87.2 78.0 - 100.0 fL   MCH 26.3 26.0 - 34.0 pg   MCHC 30.2 30.0 - 36.0 g/dL   RDW 18.8 (H) 11.5 - 15.5 %   Platelets 224 150 - 400 K/uL   Neutrophils Relative % 75 %   Neutro Abs 4.7 1.7 - 7.7 K/uL   Lymphocytes Relative 14 %   Lymphs Abs 0.9 0.7 - 4.0 K/uL   Monocytes Relative 7 %   Monocytes Absolute 0.4 0.1 - 1.0 K/uL   Eosinophils Relative 3 %   Eosinophils Absolute 0.2 0.0 - 0.7 K/uL   Basophils Relative 1 %   Basophils Absolute 0.0 0.0 - 0.1 K/uL  Basic metabolic  panel     Status: Abnormal   Collection Time: 07/01/15  4:30 AM  Result Value Ref Range   Sodium 152 (H) 135 - 145 mmol/L    Comment: DELTA CHECK NOTED   Potassium 3.3 (L) 3.5 - 5.1 mmol/L   Chloride 112 (H) 101 - 111 mmol/L   CO2 24 22 - 32 mmol/L   Glucose, Bld 114 (H) 65 - 99 mg/dL   BUN 18 6 - 20 mg/dL   Creatinine, Ser 1.24 (H) 0.44 - 1.00 mg/dL   Calcium 8.6 (L) 8.9 - 10.3 mg/dL   GFR calc non Af Amer 43 (L) >60 mL/min   GFR calc Af Amer 50 (L) >60 mL/min    Comment: (NOTE) The eGFR has been calculated using the CKD EPI equation. This calculation has not been validated in all clinical situations. eGFR's persistently <60 mL/min signify possible Chronic Kidney Disease.    Anion gap 16 (H) 5 - 15  Glucose, capillary     Status: Abnormal   Collection Time: 07/01/15  7:27 AM  Result Value Ref Range   Glucose-Capillary 109 (H) 65 - 99 mg/dL  Glucose, capillary     Status: Abnormal   Collection Time: 07/01/15 12:02 PM  Result Value Ref Range   Glucose-Capillary 111 (H) 65 - 99 mg/dL  Glucose, capillary     Status: Abnormal   Collection Time: 07/01/15  4:18 PM  Result Value Ref Range   Glucose-Capillary 116 (H) 65 - 99 mg/dL  Glucose, capillary     Status: Abnormal   Collection Time: 07/01/15  8:34 PM  Result Value Ref Range   Glucose-Capillary 140 (H) 65 - 99 mg/dL  Glucose, capillary     Status: Abnormal   Collection Time: 07/02/15 12:42 AM  Result Value Ref Range   Glucose-Capillary 148 (H) 65 - 99 mg/dL  Glucose, capillary     Status: Abnormal   Collection Time: 07/02/15  4:09 AM  Result Value Ref Range   Glucose-Capillary 177 (H) 65 - 99 mg/dL  Basic metabolic panel     Status: Abnormal   Collection Time: 07/02/15  7:34 AM  Result Value Ref Range   Sodium 143 135 - 145  mmol/L   Potassium 3.0 (L) 3.5 - 5.1 mmol/L   Chloride 105 101 - 111 mmol/L   CO2 24 22 - 32 mmol/L   Glucose, Bld 183 (H) 65 - 99 mg/dL   BUN 10 6 - 20 mg/dL   Creatinine, Ser 1.11 (H) 0.44 -  1.00 mg/dL   Calcium 8.4 (L) 8.9 - 10.3 mg/dL   GFR calc non Af Amer 49 (L) >60 mL/min   GFR calc Af Amer 57 (L) >60 mL/min    Comment: (NOTE) The eGFR has been calculated using the CKD EPI equation. This calculation has not been validated in all clinical situations. eGFR's persistently <60 mL/min signify possible Chronic Kidney Disease.    Anion gap 14 5 - 15  CBC with Differential/Platelet     Status: Abnormal   Collection Time: 07/02/15  7:34 AM  Result Value Ref Range   WBC 8.2 4.0 - 10.5 K/uL   RBC 3.79 (L) 3.87 - 5.11 MIL/uL   Hemoglobin 10.2 (L) 12.0 - 15.0 g/dL   HCT 32.6 (L) 36.0 - 46.0 %   MCV 86.0 78.0 - 100.0 fL   MCH 26.9 26.0 - 34.0 pg   MCHC 31.3 30.0 - 36.0 g/dL   RDW 18.6 (H) 11.5 - 15.5 %   Platelets 202 150 - 400 K/uL   Neutrophils Relative % 81 %   Neutro Abs 6.5 1.7 - 7.7 K/uL   Lymphocytes Relative 9 %   Lymphs Abs 0.8 0.7 - 4.0 K/uL   Monocytes Relative 9 %   Monocytes Absolute 0.8 0.1 - 1.0 K/uL   Eosinophils Relative 1 %   Eosinophils Absolute 0.1 0.0 - 0.7 K/uL   Basophils Relative 0 %   Basophils Absolute 0.0 0.0 - 0.1 K/uL  Glucose, capillary     Status: Abnormal   Collection Time: 07/02/15  7:47 AM  Result Value Ref Range   Glucose-Capillary 164 (H) 65 - 99 mg/dL    Imaging / Studies: Dg Abd 2 Views  07/01/2015  CLINICAL DATA:  Abdominal pain EXAM: ABDOMEN - 2 VIEW COMPARISON:  06/30/2015 FINDINGS: NG tube remains coiled in the stomach. There is contrast throughout the colon. Postop changes are noted. No disproportionate dilatation of bowel. There is no free intraperitoneal gas. Fatty tissue is seen surrounding the right lobe of the liver. There are nonspecific air-fluid levels in non-dilated bowel. IMPRESSION: Resolved small bowel obstruction pattern. No free intraperitoneal gas. Electronically Signed   By: Marybelle Killings M.D.   On: 07/01/2015 09:47   Dg Abd Portable 1v-small Bowel Obstruction Protocol-initial, 8 Hr Delay  06/30/2015  CLINICAL  DATA:  Small bowel obstruction.  8 hour delayed film. EXAM: PORTABLE ABDOMEN - 1 VIEW COMPARISON:  Abdominal radiograph prior today and CT on 06/29/2015 FINDINGS: Nasogastric tube is again seen within the stomach. Oral contrast is seen within the colon from prior CT. In addition, there is increased oral contrast material in mildly dilated small bowel loops in the right and mid abdomen. This is suspicious for a partial small bowel obstruction. IMPRESSION: Findings suspicious for partial small bowel obstruction. Nasogastric tube in stomach. Electronically Signed   By: Earle Gell M.D.   On: 06/30/2015 19:45   Dg Abd Portable 1v-small Bowel Protocol-position Verification  06/30/2015  CLINICAL DATA:  NG tube placement EXAM: PORTABLE ABDOMEN - 1 VIEW COMPARISON:  06/16/2015 FINDINGS: NG tube loops in the fundus of the stomach with the tip in the distal stomach. Single prominent small bowel loop in the  left lower abdomen. Oral contrast material noted within decompressed colon. Surgical clips in the lower abdomen and pelvis. IMPRESSION: NG tube tip in the distal stomach. Single prominent loop of small bowel in the left lower abdomen. Electronically Signed   By: Rolm Baptise M.D.   On: 06/30/2015 10:56    Medications / Allergies:  Scheduled Meds: . enoxaparin (LOVENOX) injection  40 mg Subcutaneous Q24H  . insulin aspart  0-9 Units Subcutaneous Q4H   Continuous Infusions: . dextrose 75 mL/hr at 07/02/15 0414   PRN Meds:.acetaminophen **OR** acetaminophen, labetalol, morphine injection, ondansetron **OR** ondansetron (ZOFRAN) IV  Antibiotics: Anti-infectives    None       Assessment/Plan Recurrent SBO-DC ngt and give fulls.  If able to tolerate, will continue to advance diet.  If she develops pain, n/v, distention, then will proceed with surgery.  I spoke with the patient and her son Ed.  VTE prophylaxis-SCD, lovenox Dispo-resolving SBO   Erby Pian, ANP-BC [facility] Pager  [phone](7A-4:30P) For consults and floor pages call [phone](7A-4:30P)  07/02/2015

## 2015-07-02 NOTE — Care Management Important Message (Signed)
Important Message  Patient Details  Name: Monica Morrison MRN: [id number] Date of Birth: 1945/08/09   Medicare Important Message Given:  Yes    [name] Valdis Bevill 07/02/2015, 2:51 PM

## 2015-07-02 NOTE — Progress Notes (Signed)
PROGRESS NOTE    Monica Morrison  [id number] DOB: Jul 12, 1945 DOA: 06/29/2015 PCP: Gara Kroner, MD (Confirm with patient/family/NH records and if not entered, this HAS to be entered at [facility] point of entry. "No PCP" if truly none.) Outpatient Specialists: Theme park manager speciality and name if known)    Brief Narrative: (Start on day 1 of progress note - keep it brief and live)    Assessment & Plan:   Principal Problem:   Small bowel obstruction ([place]) Active Problems:   Diabetes mellitus with complication ([place])   LFT elevation   Hypothyroidism   Hypertension   Hypokalemia   Dehydration  1. CV. Patient hemodynamic stable, will continue gentle hydration with IV fluids, patient tolerating po well, will decrease rate of IV fluid to 60 cc/hr times 1 liter.  2. Pulmonary. Will continue to monitor oxymetry, no signs of volume overload, supplemental 02 per [place] as needed to target 02 sat above 92%, incentive spirometer at the bedside.  3. Nephrology. Will continue to replete K with kcl, follow renal panel in am, Na down to 143 from 152,. Ng tube has been removed. Encourage po intake.  4. Gastroenterology. Follow up abdominal films with improvement, will continue conservative care, follow surgery recommendations, ng tube has been removed.  5. Endocrinology. Will continue glucose monitor, serum glucose 183. Will continue insulin sliding scale.     DVT prophylaxis: (Lovenox/Heparin/SCD's/anticoagulated/None (if comfort care) Code Status: (Full/Partial - specify details) Family Communication: (Specify name, relationship & date discussed. NO "discussed with patient") Disposition Plan: (specify when and where you expect patient to be discharged)   Consultants:   surgery  Procedures: (Don't include imaging studies which can be auto populated. Include things that cannot be auto populated i.e. Echo, Carotid and venous dopplers, Foley, Bipap, HD, tubes/drains, wound vac, central lines  etc)    Antimicrobials: (specify start and planned stop date. Auto populated tables are space occupying and do not give end dates)   Subjective: Patient feeling much better, out of bed to the chair and tolerating clears, ng tube has been removed. No nausea or vomiting, no abdominal pain. No chest pain or dyspnea.   Objective: Filed Vitals:   07/01/15 0613 07/01/15 1106 07/01/15 1427 07/01/15 2318  BP: 158/77 162/79 165/77 155/64  Pulse: 81 80 85 85  Temp: 97.9 F (36.6 C) 97.5 F (36.4 C) 97.8 F (36.6 C) 98.4 F (36.9 C)  TempSrc:  Oral Oral Oral  Resp: 18 18 18 18   Height:      Weight:      SpO2: 96% 100% 100% 98%    Intake/Output Summary (Last 24 hours) at 07/02/15 1114 Last data filed at 07/02/15 1000  Gross per 24 hour  Intake 1483.75 ml  Output    450 ml  Net 1033.75 ml   Filed Weights   06/29/15 1722  Weight: 77.111 kg (170 lb)    Examination:  General exam:  Deconditioned ENT: ng tube has been removed. Oral mucosa moist. Respiratory system:  Respiratory effort normal. Mild decreased breath sounds at bases. Cardiovascular system: S1 & S2 heard, RRR. No JVD, murmurs, rubs, gallops or clicks. No pedal edema. Gastrointestinal system: Abdomen is nondistended, soft and nontender. No organomegaly or masses felt. Normal bowel sounds heard. Central nervous system: Alert and oriented. No focal neurological deficits. Extremities: Symmetric 5 x 5 power. Skin: No rashes, lesions or ulcers Psychiatry: Judgement and insight appear normal. Mood & affect appropriate.     Data Reviewed: I have personally reviewed following labs  and imaging studies  CBC:  Recent Labs Lab 06/29/15 1805 06/30/15 0201 06/30/15 0433 07/01/15 0430 07/02/15 0734  WBC 12.5* 8.1 7.5 6.1 8.2  NEUTROABS 11.1*  --   --  4.7 6.5  HGB 12.5 10.5* 10.3* 10.3* 10.2*  HCT 39.5 34.1* 33.2* 34.1* 32.6*  MCV 84.2 84.6 86.5 87.2 86.0  PLT 279 238 245 224 [id number]   Basic Metabolic Panel:  Recent  Labs Lab 06/29/15 1805 06/30/15 0201 06/30/15 0433 07/01/15 0430 07/02/15 0734  NA 140  --  144 152* 143  K 3.2*  --  3.1* 3.3* 3.0*  CL 107  --  104 112* 105  CO2 25  --  25 24 24   GLUCOSE 178*  --  151* 114* 183*  BUN 26*  --  21* 18 10  CREATININE 1.45* 1.34* 1.40* 1.24* 1.11*  CALCIUM 9.2  --  8.7* 8.6* 8.4*   GFR: Estimated Creatinine Clearance: 46.4 mL/min (by C-G formula based on Cr of 1.11). Liver Function Tests:  Recent Labs Lab 06/29/15 1805 06/30/15 0433  AST 23 30  ALT 30 28  ALKPHOS 157* 145*  BILITOT 0.4 0.3  PROT 7.9 5.9*  ALBUMIN 3.7 2.8*    Recent Labs Lab 06/29/15 1805  LIPASE 29   No results for input(s): AMMONIA in the last 168 hours. Coagulation Profile: No results for input(s): INR, PROTIME in the last 168 hours. Cardiac Enzymes: No results for input(s): CKTOTAL, CKMB, CKMBINDEX, TROPONINI in the last 168 hours. BNP (last 3 results) No results for input(s): PROBNP in the last 8760 hours. HbA1C: No results for input(s): HGBA1C in the last 72 hours. CBG:  Recent Labs Lab 07/01/15 1618 07/01/15 2034 07/02/15 0042 07/02/15 0409 07/02/15 0747  GLUCAP 116* 140* 148* 177* 164*   Lipid Profile: No results for input(s): CHOL, HDL, LDLCALC, TRIG, CHOLHDL, LDLDIRECT in the last 72 hours. Thyroid Function Tests: No results for input(s): TSH, T4TOTAL, FREET4, T3FREE, THYROIDAB in the last 72 hours. Anemia Panel: No results for input(s): VITAMINB12, FOLATE, FERRITIN, TIBC, IRON, RETICCTPCT in the last 72 hours. Urine analysis:    Component Value Date/Time   COLORURINE YELLOW 07/01/2015 0318   APPEARANCEUR CLEAR 07/01/2015 0318   LABSPEC 1.024 07/01/2015 0318   PHURINE 5.0 07/01/2015 0318   GLUCOSEU NEGATIVE 07/01/2015 0318   HGBUR NEGATIVE 07/01/2015 0318   BILIRUBINUR NEGATIVE 07/01/2015 0318   KETONESUR 15* 07/01/2015 0318   PROTEINUR NEGATIVE 07/01/2015 0318   NITRITE NEGATIVE 07/01/2015 0318   LEUKOCYTESUR SMALL* 07/01/2015 0318    Sepsis Labs: No results for input(s): PROCALCITON, LATICACIDVEN in the last 168 hours.  No results found for this or any previous visit (from the past 240 hour(s)).       Radiology Studies: Dg Abd 2 Views  07/01/2015  CLINICAL DATA:  Abdominal pain EXAM: ABDOMEN - 2 VIEW COMPARISON:  06/30/2015 FINDINGS: NG tube remains coiled in the stomach. There is contrast throughout the colon. Postop changes are noted. No disproportionate dilatation of bowel. There is no free intraperitoneal gas. Fatty tissue is seen surrounding the right lobe of the liver. There are nonspecific air-fluid levels in non-dilated bowel. IMPRESSION: Resolved small bowel obstruction pattern. No free intraperitoneal gas. Electronically Signed   By: Marybelle Killings M.D.   On: 07/01/2015 09:47   Dg Abd Portable 1v-small Bowel Obstruction Protocol-initial, 8 Hr Delay  06/30/2015  CLINICAL DATA:  Small bowel obstruction.  8 hour delayed film. EXAM: PORTABLE ABDOMEN - 1 VIEW COMPARISON:  Abdominal radiograph prior today  and CT on 06/29/2015 FINDINGS: Nasogastric tube is again seen within the stomach. Oral contrast is seen within the colon from prior CT. In addition, there is increased oral contrast material in mildly dilated small bowel loops in the right and mid abdomen. This is suspicious for a partial small bowel obstruction. IMPRESSION: Findings suspicious for partial small bowel obstruction. Nasogastric tube in stomach. Electronically Signed   By: Earle Gell M.D.   On: 06/30/2015 19:45        Scheduled Meds: . enoxaparin (LOVENOX) injection  40 mg Subcutaneous Q24H  . insulin aspart  0-9 Units Subcutaneous Q4H   Continuous Infusions: . dextrose 75 mL/hr at 07/02/15 0414     LOS: 3 days       Tyreon Frigon Gerome Apley, MD [facility] Pager [phone]  If 7PM-7AM, please contact night-coverage [email or web address] Password [email or web address] 07/02/2015, 11:14 AM

## 2015-07-02 NOTE — Progress Notes (Signed)
PT Cancellation Note  Patient Details Name: Monica Morrison MRN: [id number] DOB: 05-18-1945   Cancelled Treatment:    Reason Eval/Treat Not Completed: Medical issues which prohibited therapy (Pt with K of 3.0 without repletion at this time)   Melford Aase 07/02/2015, 9:34 AM Elwyn Reach, [place]

## 2015-07-03 LAB — BASIC METABOLIC PANEL
Anion gap: 15 (ref 5–15)
BUN: 13 mg/dL (ref 6–20)
CHLORIDE: 99 mmol/L — AB (ref 101–111)
CO2: 25 mmol/L (ref 22–32)
Calcium: 8.6 mg/dL — ABNORMAL LOW (ref 8.9–10.3)
Creatinine, Ser: 1.44 mg/dL — ABNORMAL HIGH (ref 0.44–1.00)
GFR, EST AFRICAN AMERICAN: 42 mL/min — AB (ref >60)
GFR, EST NON AFRICAN AMERICAN: 36 mL/min — AB (ref >60)
Glucose, Bld: 223 mg/dL — ABNORMAL HIGH (ref 65–99)
POTASSIUM: 3.5 mmol/L (ref 3.5–5.1)
SODIUM: 139 mmol/L (ref 135–145)

## 2015-07-03 LAB — CBC WITH DIFFERENTIAL/PLATELET
BASOS ABS: 0 10*3/uL (ref 0.0–0.1)
BASOS PCT: 0 %
Eosinophils Absolute: 0.1 10*3/uL (ref 0.0–0.7)
Eosinophils Relative: 1 %
HEMATOCRIT: 34.9 % — AB (ref 36.0–46.0)
HEMOGLOBIN: 10.9 g/dL — AB (ref 12.0–15.0)
Lymphocytes Relative: 10 %
Lymphs Abs: 0.7 10*3/uL (ref 0.7–4.0)
MCH: 26.5 pg (ref 26.0–34.0)
MCHC: 31.2 g/dL (ref 30.0–36.0)
MCV: 84.7 fL (ref 78.0–100.0)
Monocytes Absolute: 0.6 10*3/uL (ref 0.1–1.0)
Monocytes Relative: 8 %
NEUTROS ABS: 6.1 10*3/uL (ref 1.7–7.7)
NEUTROS PCT: 81 %
Platelets: 211 10*3/uL (ref 150–400)
RBC: 4.12 MIL/uL (ref 3.87–5.11)
RDW: 18.3 % — ABNORMAL HIGH (ref 11.5–15.5)
WBC: 7.5 10*3/uL (ref 4.0–10.5)

## 2015-07-03 LAB — GLUCOSE, CAPILLARY
GLUCOSE-CAPILLARY: 177 mg/dL — AB (ref 65–99)
GLUCOSE-CAPILLARY: 200 mg/dL — AB (ref 65–99)
GLUCOSE-CAPILLARY: 209 mg/dL — AB (ref 65–99)
GLUCOSE-CAPILLARY: 233 mg/dL — AB (ref 65–99)
GLUCOSE-CAPILLARY: 258 mg/dL — AB (ref 65–99)
Glucose-Capillary: 135 mg/dL — ABNORMAL HIGH (ref 65–99)
Glucose-Capillary: 187 mg/dL — ABNORMAL HIGH (ref 65–99)

## 2015-07-03 NOTE — Evaluation (Signed)
Physical Therapy Evaluation Patient Details Name: Monica Morrison MRN: [id number] DOB: 03-27-1945 Today's Date: 07/03/2015   History of Present Illness  70 yo admitted with SBO. PMHx: breast CA, CKD, DM, HTN, hypothyroidism, AKI  Clinical Impression  Pt admitted with above diagnosis. Pt currently with functional limitations due to the deficits listed below (see PT Problem List). Pt with bilateral ankle pain R>L that seems arthritic in nature, improved with ambulation distance. Pt would benefit from one more session of PT tomorrow before d/c home. Advised her to ambulate all the way into bathroom with nursing each time going forward as well.  Pt will benefit from skilled PT to increase their independence and safety with mobility to allow discharge to the venue listed below.      Follow Up Recommendations Home health PT;Supervision - Intermittent    Equipment Recommendations  None recommended by PT    Recommendations for Other Services       Precautions / Restrictions Precautions Precautions: Fall Precaution Comments: uses walker appropriately for fall prevention Restrictions Weight Bearing Restrictions: No      Mobility  Bed Mobility               General bed mobility comments: up in chair  Transfers Overall transfer level: Needs assistance Equipment used: Rolling walker (2 wheeled) Transfers: Sit to/from Stand Sit to Stand: Min assist;Supervision         General transfer comment: pt was unable to stand without assist on first attempt, min A given for power and to steady, however, after ambulation pt tried again and was able to stand from chair with supervision  Ambulation/Gait Ambulation/Gait assistance: Min guard Ambulation Distance (Feet): 100 Feet Assistive device: Rolling walker (2 wheeled) Gait Pattern/deviations: Step-through pattern;Antalgic;Decreased dorsiflexion - left;Decreased step length - left;Decreased weight shift to left Gait velocity:  decreased Gait velocity interpretation: Below normal speed for age/gender General Gait Details: pt began with very antalgic gait but improved with distance. Advised pt to put heat on right ankle before ambulation at home as well as have shoes on for all mobility (not available today). Safe use of RW  Stairs            Wheelchair Mobility    Modified Rankin (Stroke Patients Only)       Balance Overall balance assessment: Needs assistance Sitting-balance support: No upper extremity supported Sitting balance-Leahy Scale: Good     Standing balance support: Bilateral upper extremity supported Standing balance-Leahy Scale: Poor Standing balance comment: needed assistance to pull up brief in standing                             Pertinent Vitals/Pain Pain Assessment: Faces Faces Pain Scale: Hurts even more Pain Location: right ankle/ foot Pain Descriptors / Indicators: Aching Pain Intervention(s): Limited activity within patient's tolerance;Monitored during session    Home Living Family/patient expects to be discharged to:: Private residence Living Arrangements: Alone Available Help at Discharge: Family;Available PRN/intermittently Type of Home: House Home Access: Level entry     Home Layout: One level Home Equipment: Walker - 4 wheels;Grab bars - tub/shower Additional Comments: Sister lives next door and they prepare meals together    Prior Function Level of Independence: Independent with assistive device(s)         Comments: pt was just here with similar complaints. Is getting ready to move to new apt but set up will be same as above and sister will be next door  Hand Dominance        Extremity/Trunk Assessment   Upper Extremity Assessment: Overall WFL for tasks assessed           Lower Extremity Assessment: RLE deficits/detail;LLE deficits/detail RLE Deficits / Details: pt with discomfort at talar joint and plantar surface of foot. ROM  and strength WFL, pain decreased with increased ambulation LLE Deficits / Details: left ankle slightly uncomfortable as well but not as bad as right. ROM and strength WFL  Cervical / Trunk Assessment: Kyphotic  Communication   Communication: HOH  Cognition Arousal/Alertness: Awake/alert Behavior During Therapy: WFL for tasks assessed/performed Overall Cognitive Status: Within Functional Limits for tasks assessed                      General Comments      Exercises General Exercises - Lower Extremity Ankle Circles/Pumps: AROM;Both;20 reps;Seated      Assessment/Plan    PT Assessment Patient needs continued PT services  PT Diagnosis Acute pain;Abnormality of gait;Difficulty walking   PT Problem List Decreased activity tolerance;Decreased balance;Decreased mobility;Pain  PT Treatment Interventions DME instruction;Gait training;Functional mobility training;Therapeutic activities;Therapeutic exercise;Balance training;Patient/family education   PT Goals (Current goals can be found in the Care Plan section) Acute Rehab PT Goals Patient Stated Goal: return home PT Goal Formulation: With patient Time For Goal Achievement: 07/10/15 Potential to Achieve Goals: Good    Frequency Min 3X/week   Barriers to discharge Decreased caregiver support      Co-evaluation               End of Session Equipment Utilized During Treatment: Gait belt Activity Tolerance: Patient tolerated treatment well Patient left: in chair;with call bell/phone within reach Nurse Communication: Mobility status         Time: 1220-1245 PT Time Calculation (min) (ACUTE ONLY): 25 min   Charges:   PT Evaluation $PT Eval Moderate Complexity: 1 Procedure PT Treatments $Gait Training: 8-22 mins   PT G Codes:      Leighton Roach, PT  Acute Rehab Services  [place], [place] 07/03/2015, 1:43 PM

## 2015-07-03 NOTE — Progress Notes (Signed)
Patient ID: Monica Morrison, female   DOB: 19-Sep-1945, 70 y.o.   MRN: [id number]     [address] [place], [place] [address]    Phone: [phone] FAX: [phone]     Subjective: Small bms yesterday.  No n/v. No abd pain.  Only concern is pain in feet.  No slurred speech, ha or weakness. Will defer to primary team.   Objective:  Vital signs:  Filed Vitals:   07/02/15 1500 07/02/15 2014 07/02/15 2341 07/03/15 0455  BP: 148/78 162/82  155/78  Pulse: 89 94  82  Temp: 98.8 F (37.1 C) 101.6 F (38.7 C) 98.9 F (37.2 C) 98.3 F (36.8 C)  TempSrc: Oral Oral  Oral  Resp: 16 17  16   Height:      Weight:      SpO2: 100% 100%  99%    Last BM Date: 07/01/15  Intake/Output   Yesterday:  05/11 0701 - 05/12 0700 In: 1563.8 [P.O.:720; I.V.:843.8] Out: 250 [Urine:250] This shift: I/O last 3 completed shifts: In: 2657.5 [P.O.:720; I.V.:1937.5] Out: 300 [Urine:250; Emesis/NG output:50]    Physical Exam: General: Pt awake/alert/oriented x4 in no acute distress Abdomen: Soft. Nondistended. Non tender. No evidence of peritonitis. No incarcerated hernias.     Problem List:   Principal Problem:   Small bowel obstruction (HCC) Active Problems:   Diabetes mellitus with complication (HCC)   LFT elevation   Hypothyroidism   Hypertension   Hypokalemia   Dehydration    Results:   Labs: Results for orders placed or performed during the hospital encounter of 06/29/15 (from the past 48 hour(s))  Glucose, capillary     Status: Abnormal   Collection Time: 07/01/15 12:02 PM  Result Value Ref Range   Glucose-Capillary 111 (H) 65 - 99 mg/dL  Glucose, capillary     Status: Abnormal   Collection Time: 07/01/15  4:18 PM  Result Value Ref Range   Glucose-Capillary 116 (H) 65 - 99 mg/dL  Glucose, capillary     Status: Abnormal   Collection Time: 07/01/15  8:34 PM  Result Value Ref Range   Glucose-Capillary  140 (H) 65 - 99 mg/dL  Glucose, capillary     Status: Abnormal   Collection Time: 07/02/15 12:42 AM  Result Value Ref Range   Glucose-Capillary 148 (H) 65 - 99 mg/dL  Glucose, capillary     Status: Abnormal   Collection Time: 07/02/15  4:09 AM  Result Value Ref Range   Glucose-Capillary 177 (H) 65 - 99 mg/dL  Basic metabolic panel     Status: Abnormal   Collection Time: 07/02/15  7:34 AM  Result Value Ref Range   Sodium 143 135 - 145 mmol/L   Potassium 3.0 (L) 3.5 - 5.1 mmol/L   Chloride 105 101 - 111 mmol/L   CO2 24 22 - 32 mmol/L   Glucose, Bld 183 (H) 65 - 99 mg/dL   BUN 10 6 - 20 mg/dL   Creatinine, Ser 1.11 (H) 0.44 - 1.00 mg/dL   Calcium 8.4 (L) 8.9 - 10.3 mg/dL   GFR calc non Af Amer 49 (L) >60 mL/min   GFR calc Af Amer 57 (L) >60 mL/min    Comment: (NOTE) The eGFR has been calculated using the CKD EPI equation. This calculation has not been validated in all clinical situations. eGFR's persistently <60 mL/min signify possible Chronic Kidney Disease.    Anion gap 14 5 - 15  CBC with Differential/Platelet     Status: Abnormal   Collection Time: 07/02/15  7:34 AM  Result Value Ref Range   WBC 8.2 4.0 - 10.5 K/uL   RBC 3.79 (L) 3.87 - 5.11 MIL/uL   Hemoglobin 10.2 (L) 12.0 - 15.0 g/dL   HCT 32.6 (L) 36.0 - 46.0 %   MCV 86.0 78.0 - 100.0 fL   MCH 26.9 26.0 - 34.0 pg   MCHC 31.3 30.0 - 36.0 g/dL   RDW 18.6 (H) 11.5 - 15.5 %   Platelets 202 150 - 400 K/uL   Neutrophils Relative % 81 %   Neutro Abs 6.5 1.7 - 7.7 K/uL   Lymphocytes Relative 9 %   Lymphs Abs 0.8 0.7 - 4.0 K/uL   Monocytes Relative 9 %   Monocytes Absolute 0.8 0.1 - 1.0 K/uL   Eosinophils Relative 1 %   Eosinophils Absolute 0.1 0.0 - 0.7 K/uL   Basophils Relative 0 %   Basophils Absolute 0.0 0.0 - 0.1 K/uL  Glucose, capillary     Status: Abnormal   Collection Time: 07/02/15  7:47 AM  Result Value Ref Range   Glucose-Capillary 164 (H) 65 - 99 mg/dL  Glucose, capillary     Status: Abnormal    Collection Time: 07/02/15 11:20 AM  Result Value Ref Range   Glucose-Capillary 193 (H) 65 - 99 mg/dL  Glucose, capillary     Status: Abnormal   Collection Time: 07/02/15  4:27 PM  Result Value Ref Range   Glucose-Capillary 250 (H) 65 - 99 mg/dL  Glucose, capillary     Status: Abnormal   Collection Time: 07/02/15  8:04 PM  Result Value Ref Range   Glucose-Capillary 242 (H) 65 - 99 mg/dL  Glucose, capillary     Status: Abnormal   Collection Time: 07/03/15 12:00 AM  Result Value Ref Range   Glucose-Capillary 187 (H) 65 - 99 mg/dL   Comment 1 Notify RN   Glucose, capillary     Status: Abnormal   Collection Time: 07/03/15  4:05 AM  Result Value Ref Range   Glucose-Capillary 135 (H) 65 - 99 mg/dL   Comment 1 Notify RN   Glucose, capillary     Status: Abnormal   Collection Time: 07/03/15  7:47 AM  Result Value Ref Range   Glucose-Capillary 177 (H) 65 - 99 mg/dL    Imaging / Studies: Dg Abd 2 Views  07/01/2015  CLINICAL DATA:  Abdominal pain EXAM: ABDOMEN - 2 VIEW COMPARISON:  06/30/2015 FINDINGS: NG tube remains coiled in the stomach. There is contrast throughout the colon. Postop changes are noted. No disproportionate dilatation of bowel. There is no free intraperitoneal gas. Fatty tissue is seen surrounding the right lobe of the liver. There are nonspecific air-fluid levels in non-dilated bowel. IMPRESSION: Resolved small bowel obstruction pattern. No free intraperitoneal gas. Electronically Signed   By: Marybelle Killings M.D.   On: 07/01/2015 09:47    Medications / Allergies:  Scheduled Meds: . enoxaparin (LOVENOX) injection  40 mg Subcutaneous Q24H  . insulin aspart  0-9 Units Subcutaneous Q4H   Continuous Infusions:  PRN Meds:.acetaminophen **OR** acetaminophen, labetalol, morphine injection, ondansetron **OR** ondansetron (ZOFRAN) IV  Antibiotics: Anti-infectives    None       Assessment/Plan Recurrent SBO-advance to soft diet.  If able to tolerate, stable for DC from a  surgical standpoint.   Foot pain-defer to primary team VTE prophylaxis-SCD, lovenox   Erby Pian, ANP-BC [facility] Pager [phone](7A-4:30P) For consults  and floor pages call [phone](7A-4:30P)  07/03/2015 8:58 AM

## 2015-07-03 NOTE — Progress Notes (Signed)
PROGRESS NOTE    Monica Morrison  [id number] DOB: 1946/02/16 DOA: 06/29/2015 PCP: Gara Kroner, MD (Confirm with patient/family/NH records and if not entered, this HAS to be entered at [facility] point of entry. "No PCP" if truly none.) Outpatient Specialists: Theme park manager speciality and name if known)    Brief Narrative: (Start on day 1 of progress note - keep it brief and live)   Assessment & Plan:   Principal Problem:   Small bowel obstruction ([place]) Active Problems:   Diabetes mellitus with complication ([place])   LFT elevation   Hypothyroidism   Hypertension   Hypokalemia   Dehydration   1. CV. Patient hemodynamic stable, patient off IV fluids, positive fever, suspected atelectasis, will continue to hold on antibiotics, follow cell count and temperature curve.  2. Pulmonary. Monitor oxymetry, no signs of volume overload, supplemental 02 per [place] as needed to target 02 sat above 92%, reinforced use of incentive spirometer at the bedside.  3. Nephrology. Off iv fluids will follow on renal panel today and in am, may need replete K.  4. Gastroenterology. Patient tolerating po well, advancing diet per surgery. No signs or recurrent obstruction.  5. Endocrinology. Will continue glucose monitor. Will continue insulin sliding scale.  6. dvt px  Patient continue at moderate risk for recurrent fever, plan for possible discharge in am.   DVT prophylaxis: (Lovenox/Heparin/SCD's/anticoagulated/None (if comfort care) Code Status: (Full/Partial - specify details) Family Communication: (Specify name, relationship & date discussed. NO "discussed with patient") Disposition Plan: (specify when and where you expect patient to be discharged)   Consultants:   surgery  Procedures: (Don't include imaging studies which can be auto populated. Include things that cannot be auto populated i.e. Echo, Carotid and venous dopplers, Foley, Bipap, HD, tubes/drains, wound vac, central lines  etc)  Antimicrobials: (specify start and planned stop date. Auto populated tables are space occupying and do not give end dates)     Subjective: Patient out of bed, positive pain on the left ankle, moderate in intensity, worse with movement, sharp in nature. No dyspnea or chest pain. No nausea, abdominal pain or vomiting. Had fever last night.   Objective: Filed Vitals:   07/02/15 1500 07/02/15 2014 07/02/15 2341 07/03/15 0455  BP: 148/78 162/82  155/78  Pulse: 89 94  82  Temp: 98.8 F (37.1 C) 101.6 F (38.7 C) 98.9 F (37.2 C) 98.3 F (36.8 C)  TempSrc: Oral Oral  Oral  Resp: 16 17  16   Height:      Weight:      SpO2: 100% 100%  99%    Intake/Output Summary (Last 24 hours) at 07/03/15 1310 Last data filed at 07/03/15 0938  Gross per 24 hour  Intake 1293.75 ml  Output    251 ml  Net 1042.75 ml   Filed Weights   06/29/15 1722  Weight: 77.111 kg (170 lb)    Examination:  General exam: deconditioned E ENT: oral mucosa moist, conjunctiva mild pale. Respiratory system: .Respiratory effort normal. Mild decrease inspiratory effort with decreased breath sounds at bases. Cardiovascular system: S1 & S2 heard, RRR. No JVD, murmurs, rubs, gallops or clicks. No pedal edema. Gastrointestinal system: Abdomen is nondistended, soft and nontender. No organomegaly or masses felt. Normal bowel sounds heard. Central nervous system: Alert and oriented. No focal neurological deficits. Extremities: Symmetric 5 x 5 power. Bilateral ankle edema, non pitting, non tender, no increase local temperature or erythema. Skin: No rashes, lesions or ulcers      Data Reviewed: I  have personally reviewed following labs and imaging studies  CBC:  Recent Labs Lab 06/29/15 1805 06/30/15 0201 06/30/15 0433 07/01/15 0430 07/02/15 0734  WBC 12.5* 8.1 7.5 6.1 8.2  NEUTROABS 11.1*  --   --  4.7 6.5  HGB 12.5 10.5* 10.3* 10.3* 10.2*  HCT 39.5 34.1* 33.2* 34.1* 32.6*  MCV 84.2 84.6 86.5 87.2  86.0  PLT 279 238 245 224 [id number]   Basic Metabolic Panel:  Recent Labs Lab 06/29/15 1805 06/30/15 0201 06/30/15 0433 07/01/15 0430 07/02/15 0734  NA 140  --  144 152* 143  K 3.2*  --  3.1* 3.3* 3.0*  CL 107  --  104 112* 105  CO2 25  --  25 24 24   GLUCOSE 178*  --  151* 114* 183*  BUN 26*  --  21* 18 10  CREATININE 1.45* 1.34* 1.40* 1.24* 1.11*  CALCIUM 9.2  --  8.7* 8.6* 8.4*   GFR: Estimated Creatinine Clearance: 46.4 mL/min (by C-G formula based on Cr of 1.11). Liver Function Tests:  Recent Labs Lab 06/29/15 1805 06/30/15 0433  AST 23 30  ALT 30 28  ALKPHOS 157* 145*  BILITOT 0.4 0.3  PROT 7.9 5.9*  ALBUMIN 3.7 2.8*    Recent Labs Lab 06/29/15 1805  LIPASE 29   No results for input(s): AMMONIA in the last 168 hours. Coagulation Profile: No results for input(s): INR, PROTIME in the last 168 hours. Cardiac Enzymes: No results for input(s): CKTOTAL, CKMB, CKMBINDEX, TROPONINI in the last 168 hours. BNP (last 3 results) No results for input(s): PROBNP in the last 8760 hours. HbA1C: No results for input(s): HGBA1C in the last 72 hours. CBG:  Recent Labs Lab 07/02/15 2004 07/03/15 07/03/15 0405 07/03/15 0747 07/03/15 1148  GLUCAP 242* 187* 135* 177* 258*   Lipid Profile: No results for input(s): CHOL, HDL, LDLCALC, TRIG, CHOLHDL, LDLDIRECT in the last 72 hours. Thyroid Function Tests: No results for input(s): TSH, T4TOTAL, FREET4, T3FREE, THYROIDAB in the last 72 hours. Anemia Panel: No results for input(s): VITAMINB12, FOLATE, FERRITIN, TIBC, IRON, RETICCTPCT in the last 72 hours. Urine analysis:    Component Value Date/Time   COLORURINE YELLOW 07/01/2015 0318   APPEARANCEUR CLEAR 07/01/2015 0318   LABSPEC 1.024 07/01/2015 0318   PHURINE 5.0 07/01/2015 0318   GLUCOSEU NEGATIVE 07/01/2015 0318   HGBUR NEGATIVE 07/01/2015 0318   BILIRUBINUR NEGATIVE 07/01/2015 0318   KETONESUR 15* 07/01/2015 0318   PROTEINUR NEGATIVE 07/01/2015 0318   NITRITE  NEGATIVE 07/01/2015 0318   LEUKOCYTESUR SMALL* 07/01/2015 0318   Sepsis Labs: No results for input(s): PROCALCITON, LATICACIDVEN in the last 168 hours.  No results found for this or any previous visit (from the past 240 hour(s)).       Radiology Studies: No results found.      Scheduled Meds: . enoxaparin (LOVENOX) injection  40 mg Subcutaneous Q24H  . insulin aspart  0-9 Units Subcutaneous Q4H   Continuous Infusions:    LOS: 4 days        Obed Samek Gerome Apley, MD [facility] Pager [phone]  If 7PM-7AM, please contact night-coverage [email or web address] Password [email or web address] 07/03/2015, 1:10 PM

## 2015-07-03 NOTE — Care Management Note (Signed)
Case Management Note  Patient Details  Name: Monica Morrison MRN: [id number] Date of Birth: 01-10-1946  Subjective/Objective:                    Action/Plan:  PT recommends home health PT . Discussed with patient. Prior to admission she was going to [facility] and she wishes to continue with that .  Expected Discharge Date:  07/03/15               Expected Discharge Plan:  Home/Self Care  In-House Referral:     Discharge planning Services  CM Consult  Post Acute Care Choice:    Choice offered to:  Patient  DME Arranged:    DME Agency:     HH Arranged:  Patient Refused [place] Agency:     Status of Service:  Completed, signed off  Medicare Important Message Given:  Yes Date Medicare IM Given:    Medicare IM give by:    Date Additional Medicare IM Given:    Additional Medicare Important Message give by:     If discussed at [place] of Stay Meetings, dates discussed:    Additional Comments:  Marilu Favre, RN 07/03/2015, 2:37 PM

## 2015-07-04 LAB — CBC WITH DIFFERENTIAL/PLATELET
BASOS ABS: 0 10*3/uL (ref 0.0–0.1)
Basophils Relative: 0 %
Eosinophils Absolute: 0.1 10*3/uL (ref 0.0–0.7)
Eosinophils Relative: 2 %
HEMATOCRIT: 32.7 % — AB (ref 36.0–46.0)
HEMOGLOBIN: 10.1 g/dL — AB (ref 12.0–15.0)
LYMPHS ABS: 0.8 10*3/uL (ref 0.7–4.0)
LYMPHS PCT: 10 %
MCH: 25.8 pg — AB (ref 26.0–34.0)
MCHC: 30.9 g/dL (ref 30.0–36.0)
MCV: 83.6 fL (ref 78.0–100.0)
Monocytes Absolute: 0.7 10*3/uL (ref 0.1–1.0)
Monocytes Relative: 9 %
NEUTROS ABS: 5.8 10*3/uL (ref 1.7–7.7)
NEUTROS PCT: 79 %
Platelets: 223 10*3/uL (ref 150–400)
RBC: 3.91 MIL/uL (ref 3.87–5.11)
RDW: 18.5 % — ABNORMAL HIGH (ref 11.5–15.5)
WBC: 7.3 10*3/uL (ref 4.0–10.5)

## 2015-07-04 LAB — BASIC METABOLIC PANEL
ANION GAP: 12 (ref 5–15)
BUN: 14 mg/dL (ref 6–20)
CHLORIDE: 103 mmol/L (ref 101–111)
CO2: 25 mmol/L (ref 22–32)
Calcium: 8.4 mg/dL — ABNORMAL LOW (ref 8.9–10.3)
Creatinine, Ser: 1.34 mg/dL — ABNORMAL HIGH (ref 0.44–1.00)
GFR calc Af Amer: 45 mL/min — ABNORMAL LOW (ref >60)
GFR, EST NON AFRICAN AMERICAN: 39 mL/min — AB (ref >60)
GLUCOSE: 187 mg/dL — AB (ref 65–99)
POTASSIUM: 3 mmol/L — AB (ref 3.5–5.1)
Sodium: 140 mmol/L (ref 135–145)

## 2015-07-04 LAB — GLUCOSE, CAPILLARY
GLUCOSE-CAPILLARY: 195 mg/dL — AB (ref 65–99)
GLUCOSE-CAPILLARY: 265 mg/dL — AB (ref 65–99)
Glucose-Capillary: 192 mg/dL — ABNORMAL HIGH (ref 65–99)

## 2015-07-04 NOTE — Discharge Summary (Signed)
Monica Morrison, is a 70 y.o. female  DOB [birth year]  MRN [id number].  Admission date:  06/29/2015  Admitting Physician  Rise Patience, MD  Discharge Date:  07/04/2015   Primary MD  Gara Kroner, MD  Recommendations for primary care physician for things to follow:   Patient has been discharged home with instructions to follow-up with his primary care physician within 7 days, home health and home physical therapy will be arranged for posthospitalization follow-up.   Admission Diagnosis  Small bowel obstruction (HCC) [K56.69] Abdominal pain [R10.9]  Acute kidney injury   Discharge Diagnosis  Small bowel obstruction (HCC) [K56.69] Abdominal pain [R10.9]   Acute kidney injury  Principal Problem:   Small bowel obstruction (Monica Morrison) Active Problems:   Diabetes mellitus with complication (HCC)   LFT elevation   Hypothyroidism   Hypertension   Hypokalemia   Dehydration      Past Medical History  Diagnosis Date  . Cancer (HCC)     breast  . Hypertension   . Diabetes mellitus   . Arthritis   . Breast cancer ([place])   . CKD (chronic kidney disease)   . Hypothyroidism 06/22/2015    Past Surgical History  Procedure Laterality Date  . Abdominal hysterectomy    . Breast surgery         HPI  from the history and physical done on the day of admission:   This is a 70 year-old female who presented to hospital with chief complaint of vomiting and abdominal pain. 24 hours prior to presentation to the hospital she developed  nausea and abdominal pain, which was moderate in intensity, diffuse, colicky in nature and associated with vomiting. She has had a recent episode small bowel obstruction related to Campylobacter colitis. Due to the recurrence of her symptoms she decided to come back to the hospital for further evaluation. On the initial physical examination her systolic blood pressure was 152,  heart rate was 78, respiratory rate  was 17, she was afebrile, her lungs were clear to auscultation bilaterally no wheezing, rales or rhonchi, heart S1-S2 present, no gallops or murmurs. Her abdomen was soft with hyperactive bowel sounds but not peritoneal signs. Her serum creatinine was 1.45 with a potassium of 3.2, and a serum bicarbonate of 25.  Her CT of the abdomen and pelvis showed mid small bowel obstruction with transition point in the anterior lower abdomen, small bowel wall thickening and adjacent subcentimeter mesenteric lymph nodes at the transition point.   Patient was admitted to the hospital with recurrent small bowel obstruction.    Hospital Course:    1. Cardiovascular. Patient remained hemodynamically stable, her metoprolol was held due to risk of hypotension, patient received intravenous IV fluids. No signs of systemic infection.  2. Pulmonary. Patient received oxygen per nasal cannula to target O2 saturation were 92%, patient had oximetry monitor while she was in hospital, no signs of volume overload, she tolerated well to IV fluids.  3. Nephrology. Patient's initial creatinine was 1.40, but I'll discharges down  to 1.11, patient potassium has been repleted. Would recommend follow-up as an outpatient. At this point patient is tolerating diet adequately, her discharge potassium was 3.0, patient will received 40 meq of potassium chloride before discharge.  4. Gastroenterology. Patient responded well to supportive therapy with IV fluids, and intravenous antiemetics, she had initially a NG tube in place that resulted on improvement of her symptoms. Her diet was advanced slowly to the point where she is tolerating diet without major difficulties. Her NG tube was removed successfully. Patient was seen by the general surgery team on this hospitalization with recommendations to continue conservative management.  5. Endocrinology. Patient was placed on insulin sliding scale for glucose  coverage, her serum glucose had been ranged between 201 and 178, by the time of discharge patient will be resuming her oral hypoglycemic agents.   Discharge Condition:  Stable  Follow UP  Follow-up Information    Follow up with Gara Kroner, MD In 1 week.   Specialty:  Family Medicine   Contact information:   [place] [address] [phone]        Consults obtained - Gen. surgery  Diet and Activity recommendation: See Discharge Instructions below  Discharge Instructions    Patient is instructed to follow-up with primary care physician within 7 days, resume her diet slowly, continue home physical therapy and follow-up with home health services.   Discharge Instructions    Diet - low sodium heart healthy    Complete by:  As directed      Discharge instructions    Complete by:  As directed   Follow with primary care in 7 days. Home health and home therapy will be arranged.     Increase activity slowly    Complete by:  As directed              Discharge Medications       Medication List    TAKE these medications        alendronate 70 MG tablet  Commonly known as:  FOSAMAX  Take 70 mg by mouth every 7 (seven) days. Take with a full glass of water on an empty stomach.     ALPRAZolam 0.25 MG tablet  Commonly known as:  XANAX  Take 0.25 mg by mouth at bedtime as needed.     atorvastatin 20 MG tablet  Commonly known as:  LIPITOR  Take 20 mg by mouth daily.     bismuth subsalicylate [id number] suspension  Commonly known as:  PEPTO BISMOL  USE AS DIRECTED FOR LOOSE STOOLS.     CALCIUM 500 PO  Take by mouth.     escitalopram 10 MG tablet  Commonly known as:  LEXAPRO     glipiZIDE 5 MG tablet  Commonly known as:  GLUCOTROL  Take 10 mg by mouth daily.     levothyroxine 112 MCG tablet  Commonly known as:  SYNTHROID, LEVOTHROID  Take 112 mcg by mouth daily.     metoprolol tartrate 25 MG tablet  Commonly known as:   LOPRESSOR  Take 1 tablet (25 mg total) by mouth 2 (two) times daily.     MULTIVITAMIN PO  Take by mouth.     omeprazole 20 MG capsule  Commonly known as:  PRILOSEC     ONE TOUCH ULTRA TEST test strip  Generic drug:  glucose blood     pioglitazone 15 MG tablet  Commonly known as:  ACTOS  Take 15 mg by  mouth daily.     saccharomyces boulardii 250 MG capsule  Commonly known as:  FLORASTOR  YOU CAN BUY THIS AT ANY DRUG STORE, AND FOLLOW DIRECTIONS ON PACKAGE.  USE FOR THE NEXT 2-3 WEEKS TILL YOUR STOOLS ARE NORMAL.        Major procedures and Radiology Reports - PLEASE review detailed and final reports for all details, in brief -     Dg Abd 1 View  06/15/2015  CLINICAL DATA:  Vomiting brown liquid X 3 days, diarrhea since she got the emergency room today, Hx of acid reflux, hiatal hernia, EXAM: ABDOMEN - 1 VIEW COMPARISON:  06/15/2015 CT scan at 07:41 FINDINGS: Intravenous contrast is seen within the renal collecting systems ureters and bladder. Mildly dilated loops of small bowel again identified with some gas into the proximal colon and decompressed more distal colon. IMPRESSION: Bowel gas pattern nonspecific radiographically but does again demonstrates some dilated loops of small bowel with the possibility again of small-bowel obstruction. Electronically Signed   By: Skipper Cliche M.D.   On: 06/15/2015 15:32   Ct Abdomen Pelvis W Contrast  06/29/2015  CLINICAL DATA:  Abdominal pain and vomiting today. Recent colitis and small bowel obstruction. Left breast carcinoma. EXAM: CT ABDOMEN AND PELVIS WITH CONTRAST TECHNIQUE: Multidetector CT imaging of the abdomen and pelvis was performed using the standard protocol following bolus administration of intravenous contrast. CONTRAST:  62mL ISOVUE-300 IOPAMIDOL (ISOVUE-300) INJECTION 61% COMPARISON:  06/15/2015 and 02/01/2007 FINDINGS: Lower chest:  No acute findings. Hepatobiliary: Poorly defined hypovascular mass in the posterior right hepatic  lobe measures 3.0 cm on image 15/series 2 and has not significantly changed compared to most recent exam although it is new compared to 2008 exam. This is suspicious for a liver metastasis. No other definite liver masses are identified. Tiny calcified gallstones are again seen. Gallbladder is mildly distended but there is no evidence of wall thickening or pericholecystic inflammatory changes. Pancreas: No mass or other significant abnormality identified. Spleen: Within normal limits in size and appearance. Adrenals/Urinary Tract: Bilateral adrenal masses are again seen measuring 3.5 cm on the right and 3.2 cm on the left. These are unchanged since most recent exam but are new since 2008 and consistent with adrenal metastases. Tiny left renal cysts are again noted however there is no evidence of renal masses. Moderate right hydronephrosis and ureterectasis shows no significant change. Asymmetric wall thickening is again seen along the right lateral and posterior bladder walls. This could be due to neoplasm or cystitis. Stomach/Bowel: Small to moderate hiatal hernia seen. Multiple moderately dilated small bowel loops are again seen containing air-fluid levels. There is a transition point in the anterior lower abdomen were there is a thickened small bowel loop as well as small less than 1 cm lymph nodes in the adjacent small bowel mesentery. Colonic diverticulosis also noted, without of diverticulitis. Vascular/Lymphatic: Bilateral iliac and retroperitoneal surgical clips seen. No pathologically enlarged lymph nodes identified.No evidence of abdominal aortic aneurysm. Reproductive: Prior hysterectomy noted. Adnexal regions are unremarkable in appearance. Other: None. Musculoskeletal:  No suspicious bone lesions identified. IMPRESSION: Stable appearance of mid small bowel obstruction with transition point in the anterior lower abdomen. Small bowel wall thickening and adjacent sub-cm mesenteric lymph nodes at the  transition point may be secondary to metastatic disease, although differential diagnosis also includes ischemic and inflammatory etiologies as well as adhesion. Stable moderate right hydronephrosis and asymmetric bladder wall thickening. This may be due to bladder carcinoma or cystitis. Bilateral adrenal masses  are stable but new since 2008, consistent with adrenal metastases. No significant change in 3 cm hypovascular right hepatic lobe mass, suspicious for hepatic metastasis. Cholelithiasis.  No radiographic evidence of cholecystitis. Small to moderate hiatal hernia. Electronically Signed   By: Earle Gell M.D.   On: 06/29/2015 21:17   Ct Abdomen Pelvis W Contrast  06/15/2015  CLINICAL DATA:  Nausea and vomiting for 2 days with right upper quadrant abdominal pain and tenderness EXAM: CT ABDOMEN AND PELVIS WITH CONTRAST TECHNIQUE: Multidetector CT imaging of the abdomen and pelvis was performed using the standard protocol following bolus administration of intravenous contrast. CONTRAST:  80mL ISOVUE-300 IOPAMIDOL (ISOVUE-300) INJECTION 61% COMPARISON:  02/01/2007 FINDINGS: Lower chest: Postsurgical change from left breast surgery noted. Mitral valve calcification. Visualized portions of the lung bases clear. Hepatobiliary: Cholelithiasis is again identified although there appear to be few or gallstones currently than on the prior study. There is 3 cm low-attenuation lesion in the posterior right lobe of the liver that was not present previously. Pancreas: Normal Spleen: Normal Adrenals/Urinary Tract: A 3.4 cm right adrenal mass present that was not present previously. A 3.1 cm left adrenal mass is present that was not present previously. There is bilateral renal cortical atrophy. There is no hydronephrosis. Bladder is decompressed. There appears to be bladder wall thickening. Stomach/Bowel: There is a small hiatal hernia. There is significant thickening of the wall of the rectum and distal half of the sigmoid  colon. There is mild inflammatory change surrounding the rectum in the presacral region. There is diverticulosis of the sigmoid colon. There is diverticulosis of the descending colon. The appendix is normal. Duodenum is normal but most of the jejunum is distended and demonstrates air-fluid levels. Dilatation is up to a diameter of 3.8 cm. The transition is in the right lower abdomen with decompression of the ileum. Distal loops of decompressed small bowel show fecalized material. Vascular/Lymphatic: Atherosclerotic calcification of the aortoiliac vessels. No significant abdominal or pelvic adenopathy. Reproductive: Uterus not visualized.  No pelvic masses. Other: Subtle focus of hyperattenuation inferior left adnexa into left rectovesical compartment. Significance uncertain with possible causes including mildly prominent pelvic veins or less likely contrast from a perforated sigmoid diverticulum, considered unlikely given absence of free air. Musculoskeletal: No acute musculoskeletal findings. IMPRESSION: 1. Findings consistent with small-bowel obstruction likely due to adhesions near the junction of the jejunum and ileum 2. Cholelithiasis without secondary sign of cholecystitis by CT scan 3. New masses in the right hepatic lobe and in the bilateral adrenal glands. These could represent metastatic lesions in further evaluation with MRI is recommended. 4. There is diverticulosis involving the descending colon and sigmoid colon. There is wall thickening involving the distal half of the sigmoid colon and more severely, the rectum. Findings suggest distal colitis possibly of infectious origin. 5. Subtle hyperattenuation left adnexal region as discussed above likely representing prominent pelvic veins in the area, but exact etiology and potential relationship to inflamed distal colon uncertain and deserving follow up CT imaging to reassess. Electronically Signed   By: Skipper Cliche M.D.   On: 06/15/2015 08:09   Dg  Abd 2 Views  07/01/2015  CLINICAL DATA:  Abdominal pain EXAM: ABDOMEN - 2 VIEW COMPARISON:  06/30/2015 FINDINGS: NG tube remains coiled in the stomach. There is contrast throughout the colon. Postop changes are noted. No disproportionate dilatation of bowel. There is no free intraperitoneal gas. Fatty tissue is seen surrounding the right lobe of the liver. There are nonspecific air-fluid levels in  non-dilated bowel. IMPRESSION: Resolved small bowel obstruction pattern. No free intraperitoneal gas. Electronically Signed   By: Marybelle Killings M.D.   On: 07/01/2015 09:47   Dg Abd 2 Views  06/16/2015  CLINICAL DATA:  Small bowel obstruction. EXAM: ABDOMEN - 2 VIEW COMPARISON:  June 15, 2015. FINDINGS: The bowel gas pattern is normal. Residual contrast is noted in the colon. Surgical clips are noted in the pelvis and abdomen. No abnormal calcifications are noted. IMPRESSION: No evidence of bowel obstruction or ileus. Electronically Signed   By: Marijo Conception, M.D.   On: 06/16/2015 08:53   Dg Abd Portable 1v-small Bowel Obstruction Protocol-initial, 8 Hr Delay  06/30/2015  CLINICAL DATA:  Small bowel obstruction.  8 hour delayed film. EXAM: PORTABLE ABDOMEN - 1 VIEW COMPARISON:  Abdominal radiograph prior today and CT on 06/29/2015 FINDINGS: Nasogastric tube is again seen within the stomach. Oral contrast is seen within the colon from prior CT. In addition, there is increased oral contrast material in mildly dilated small bowel loops in the right and mid abdomen. This is suspicious for a partial small bowel obstruction. IMPRESSION: Findings suspicious for partial small bowel obstruction. Nasogastric tube in stomach. Electronically Signed   By: Earle Gell M.D.   On: 06/30/2015 19:45   Dg Abd Portable 1v-small Bowel Protocol-position Verification  06/30/2015  CLINICAL DATA:  NG tube placement EXAM: PORTABLE ABDOMEN - 1 VIEW COMPARISON:  06/16/2015 FINDINGS: NG tube loops in the fundus of the stomach with the tip in  the distal stomach. Single prominent small bowel loop in the left lower abdomen. Oral contrast material noted within decompressed colon. Surgical clips in the lower abdomen and pelvis. IMPRESSION: NG tube tip in the distal stomach. Single prominent loop of small bowel in the left lower abdomen. Electronically Signed   By: Rolm Baptise M.D.   On: 06/30/2015 10:56    Micro Results     No results found for this or any previous visit (from the past 240 hour(s)).     Today   Subjective    Monica Morrison is out of bed to a chair, feeling much better no abdominal distention, no nausea or vomiting, tolerating by mouth diet adequately.   Objective   Blood pressure 143/64, pulse 89, temperature 99.1 F (37.3 C), temperature source Oral, resp. rate 18, height 5\' 3"  (1.6 m), weight 77.111 kg (170 lb), SpO2 96 %.   Intake/Output Summary (Last 24 hours) at 07/04/15 1157 Last data filed at 07/04/15 0929  Gross per 24 hour  Intake    960 ml  Output      4 ml  Net    956 ml    Exam Awake Alert, Oriented x 3, No new F.N deficits, Normal affect [place].AT,PERRAL Supple Neck,No JVD, No cervical lymphadenopathy appriciated.  Symmetrical Chest wall movement, Good air movement bilaterally, CTAB RRR,No Gallops,Rubs or new Murmurs, No Parasternal Heave +ve B.Sounds, Abd Soft, Non tender, No organomegaly appriciated, No rebound -guarding or rigidity. No Cyanosis, Clubbing, Mild by lateral ankle nonpitting edema, No new Rash or bruise   Data Review   CBC w Diff: Lab Results  Component Value Date   WBC 7.3 07/04/2015   WBC 8.6 02/09/2015   HGB 10.1* 07/04/2015   HGB 11.7 02/09/2015   HCT 32.7* 07/04/2015   HCT 37.7 02/09/2015   PLT 223 07/04/2015   PLT 249 02/09/2015   LYMPHOPCT 10 07/04/2015   LYMPHOPCT 11.6* 02/09/2015   MONOPCT 9 07/04/2015   MONOPCT 7.5  02/09/2015   EOSPCT 2 07/04/2015   EOSPCT 2.7 02/09/2015   BASOPCT 0 07/04/2015   BASOPCT 1.1 02/09/2015    CMP: Lab Results    Component Value Date   NA 140 07/04/2015   NA 141 02/09/2015   K 3.0* 07/04/2015   K 4.5 02/09/2015   CL 103 07/04/2015   CL 97* 03/12/2012   CO2 25 07/04/2015   CO2 27 02/09/2015   BUN 14 07/04/2015   BUN 30.1* 02/09/2015   CREATININE 1.34* 07/04/2015   CREATININE 1.4* 02/09/2015   PROT 5.9* 06/30/2015   PROT 7.4 02/09/2015   ALBUMIN 2.8* 06/30/2015   ALBUMIN 3.5 02/09/2015   BILITOT 0.3 06/30/2015   BILITOT 0.35 02/09/2015   ALKPHOS 145* 06/30/2015   ALKPHOS 74 02/09/2015   AST 30 06/30/2015   AST 17 02/09/2015   ALT 28 06/30/2015   ALT 13 02/09/2015  .   Total Time in preparing paper work, data evaluation and todays exam - 45 minutes  Tawni Millers M.D on 07/04/2015 at 11:57 AM  [facility]   Office  [phone]

## 2015-07-04 NOTE — Progress Notes (Signed)
Physical Therapy Treatment Patient Details Name: Monica Morrison MRN: [id number] DOB: 21-Nov-1945 Today's Date: 07/04/2015    History of Present Illness 70 yo admitted with SBO. PMHx: breast CA, CKD, DM, HTN, hypothyroidism, AKI    PT Comments    Patient reporting ankle pain better than yesterday, better as she is able to keep herself moving.  Demonstrates Supervision assist with ambulation using RW, slower pace, with mild antalgia.  Difficulty with Sit > Stand transfer, initially needing help, educated on technique with emphasis on anterior weight shift with good result, consistently Supervision following instruction.  Patient reports she will continue with outpatient '[facility]' after hospital discharge.  Patient cleared to return home from PT perspective at this time, will benefit from continued PT services following discharge.   Follow Up Recommendations  Supervision - Intermittent;Outpatient PT     Equipment Recommendations  None recommended by PT    Recommendations for Other Services       Precautions / Restrictions Precautions Precautions: Fall Precaution Comments: uses walker appropriately for fall prevention Restrictions Weight Bearing Restrictions: No    Mobility  Bed Mobility               General bed mobility comments: up in chair  Transfers Overall transfer level: Needs assistance Equipment used: Rolling walker (2 wheeled) Transfers: Sit to/from Stand Sit to Stand: Supervision         General transfer comment: Assist required for first attempt.  Successive trials with emphasis on anterior weight shift with good result, even from lower surface.  Ambulation/Gait Ambulation/Gait assistance: Supervision Ambulation Distance (Feet): 100 Feet Assistive device: Rolling walker (2 wheeled) Gait Pattern/deviations: Shuffle Gait velocity: decreased Gait velocity interpretation: Below normal speed for age/gender General Gait Details: Shorter steps  initially, better with distance. Patient reports pain better than yesterday.   Stairs            Wheelchair Mobility    Modified Rankin (Stroke Patients Only)       Balance     Sitting balance-Leahy Scale: Good       Standing balance-Leahy Scale: Poor                      Cognition Arousal/Alertness: Awake/alert Behavior During Therapy: WFL for tasks assessed/performed Overall Cognitive Status: Within Functional Limits for tasks assessed                      Exercises General Exercises - Lower Extremity Ankle Circles/Pumps: AROM;Both;20 reps;Seated    General Comments        Pertinent Vitals/Pain Pain Assessment: 0-10 Pain Score: 3  Pain Location: R ankle / foot Pain Descriptors / Indicators: Aching Pain Intervention(s): Monitored during session    Home Living                      Prior Function            PT Goals (current goals can now be found in the care plan section) Acute Rehab PT Goals Patient Stated Goal: return home PT Goal Formulation: With patient Time For Goal Achievement: 07/10/15 Potential to Achieve Goals: Good Progress towards PT goals: Progressing toward goals    Frequency  Min 3X/week    PT Plan Current plan remains appropriate    Co-evaluation             End of Session Equipment Utilized During Treatment: Gait belt Activity Tolerance: Patient tolerated treatment well Patient  left: in chair;with call bell/phone within reach     Time: 0950-1010 PT Time Calculation (min) (ACUTE ONLY): 20 min  Charges:  $Therapeutic Activity: 8-22 mins                    G Codes:      Arlon Bleier L 2015-07-10, 10:19 AM

## 2015-07-04 NOTE — Care Management Note (Signed)
Case Management Note  Patient Details  Name: Monica Morrison MRN: [id number] Date of Birth: 04/19/1945  Subjective/Objective:  70 y.o. F admitted 06/29/2015 to be discharged with HHPT/RN today. Cm had spoken with pt earlier in her stay and she had reported that she had used [facility] and she would like to continue with this agency at discharge. LM with weekday CM, as CM unable to reach this agency by phone. Will follow up at later date to see if this agency actually provides [facility] services in her area.                   Action/Plan:Will continue to follow.    Expected Discharge Date:  07/03/15               Expected Discharge Plan:  Home/Self Care  In-House Referral:     Discharge planning Services  CM Consult  Post Acute Care Choice:    Choice offered to:  Patient  DME Arranged:    DME Agency:     HH Arranged:  PT [place]:  Other - See comment ([facility])  Status of Service:  Completed, signed off  Medicare Important Message Given:  Yes Date Medicare IM Given:    Medicare IM give by:    Date Additional Medicare IM Given:    Additional Medicare Important Message give by:     If discussed at [place] of Stay Meetings, dates discussed:    Additional Comments:  Delrae Sawyers, RN 07/04/2015, 12:55 PM

## 2015-07-04 NOTE — Progress Notes (Signed)
  Subjective: Tolerating regular diet.  Having bowel movements.  Denies nausea.  Denies abdominal pain. She meets discharge criteria from surgical standpoint.  Objective: Vital signs in last 24 hours: Temp:  [98.6 F (37 C)-99.3 F (37.4 C)] 99.1 F (37.3 C) (05/13 0422) Pulse Rate:  [89-100] 89 (05/13 0422) Resp:  [18] 18 (05/13 0422) BP: (143-156)/(64-83) 143/64 mmHg (05/13 0422) SpO2:  [96 %-100 %] 96 % (05/13 0422) Last BM Date: 07/02/15  Intake/Output from previous day: 05/12 0701 - 05/13 0700 In: 1080 [P.O.:1080] Out: 3 [Urine:2; Stool:1] Intake/Output this shift:    General appearance: Alert.  Cooperative.  No distress. Resp: clear to auscultation bilaterally GI: Abdomen soft and nontender.  Not distended.  No hernias.  Benign exam  Lab Results:   Recent Labs  07/03/15 1423 07/04/15 0509  WBC 7.5 7.3  HGB 10.9* 10.1*  HCT 34.9* 32.7*  PLT 211 223   BMET  Recent Labs  07/03/15 2033 07/04/15 0509  NA 139 140  K 3.5 3.0*  CL 99* 103  CO2 25 25  GLUCOSE 223* 187*  BUN 13 14  CREATININE 1.44* 1.34*  CALCIUM 8.6* 8.4*   PT/INR No results for input(s): LABPROT, INR in the last 72 hours. ABG No results for input(s): PHART, HCO3 in the last 72 hours.  Invalid input(s): PCO2, PO2  Studies/Results: No results found.  Anti-infectives: Anti-infectives    None      Assessment/Plan:   Small bowel obstruction, resolved Patient meets discharge criteria for surgical standpoint and may be discharged Surgery will sign off. No surgical follow-up required. Please reconsult if surgical problems arise.   LOS: 5 days    Glenroy Crossen M 07/04/2015

## 2015-07-04 NOTE — Progress Notes (Signed)
Discharge instructions gone over with patient and son. Home medications gone over. Follow up appointment is to be made. Patient will be receiving home health RN and PT through [facility]. She is already established with the agency. Diet, activity, and signs and symptoms of a worsening condition discussed. Patient verbalized understanding of instructions.

## 2015-07-09 DIAGNOSIS — Z6830 Body mass index (BMI) 30.0-30.9, adult: Secondary | ICD-10-CM

## 2015-07-09 DIAGNOSIS — F329 Major depressive disorder, single episode, unspecified: Secondary | ICD-10-CM | POA: Diagnosis present

## 2015-07-09 DIAGNOSIS — C784 Secondary malignant neoplasm of small intestine: Principal | ICD-10-CM | POA: Diagnosis present

## 2015-07-09 DIAGNOSIS — Z7984 Long term (current) use of oral hypoglycemic drugs: Secondary | ICD-10-CM

## 2015-07-09 DIAGNOSIS — E039 Hypothyroidism, unspecified: Secondary | ICD-10-CM | POA: Diagnosis present

## 2015-07-09 DIAGNOSIS — Z8249 Family history of ischemic heart disease and other diseases of the circulatory system: Secondary | ICD-10-CM

## 2015-07-09 DIAGNOSIS — E1122 Type 2 diabetes mellitus with diabetic chronic kidney disease: Secondary | ICD-10-CM | POA: Diagnosis present

## 2015-07-09 DIAGNOSIS — Z8542 Personal history of malignant neoplasm of other parts of uterus: Secondary | ICD-10-CM

## 2015-07-09 DIAGNOSIS — D630 Anemia in neoplastic disease: Secondary | ICD-10-CM | POA: Diagnosis present

## 2015-07-09 DIAGNOSIS — E1121 Type 2 diabetes mellitus with diabetic nephropathy: Secondary | ICD-10-CM | POA: Diagnosis present

## 2015-07-09 DIAGNOSIS — K219 Gastro-esophageal reflux disease without esophagitis: Secondary | ICD-10-CM | POA: Diagnosis present

## 2015-07-09 DIAGNOSIS — Z833 Family history of diabetes mellitus: Secondary | ICD-10-CM

## 2015-07-09 DIAGNOSIS — K5660 Unspecified intestinal obstruction: Secondary | ICD-10-CM | POA: Diagnosis not present

## 2015-07-09 DIAGNOSIS — N183 Chronic kidney disease, stage 3 (moderate): Secondary | ICD-10-CM | POA: Diagnosis present

## 2015-07-09 DIAGNOSIS — Z90722 Acquired absence of ovaries, bilateral: Secondary | ICD-10-CM

## 2015-07-09 DIAGNOSIS — R160 Hepatomegaly, not elsewhere classified: Secondary | ICD-10-CM | POA: Diagnosis present

## 2015-07-09 DIAGNOSIS — C787 Secondary malignant neoplasm of liver and intrahepatic bile duct: Secondary | ICD-10-CM | POA: Diagnosis present

## 2015-07-09 DIAGNOSIS — E669 Obesity, unspecified: Secondary | ICD-10-CM | POA: Diagnosis present

## 2015-07-09 DIAGNOSIS — E875 Hyperkalemia: Secondary | ICD-10-CM | POA: Diagnosis not present

## 2015-07-09 DIAGNOSIS — E876 Hypokalemia: Secondary | ICD-10-CM | POA: Diagnosis present

## 2015-07-09 DIAGNOSIS — I129 Hypertensive chronic kidney disease with stage 1 through stage 4 chronic kidney disease, or unspecified chronic kidney disease: Secondary | ICD-10-CM | POA: Diagnosis present

## 2015-07-09 DIAGNOSIS — Z923 Personal history of irradiation: Secondary | ICD-10-CM

## 2015-07-09 DIAGNOSIS — E785 Hyperlipidemia, unspecified: Secondary | ICD-10-CM | POA: Diagnosis present

## 2015-07-09 DIAGNOSIS — C50512 Malignant neoplasm of lower-outer quadrant of left female breast: Secondary | ICD-10-CM | POA: Diagnosis present

## 2015-07-09 DIAGNOSIS — Z90710 Acquired absence of both cervix and uterus: Secondary | ICD-10-CM

## 2015-07-09 DIAGNOSIS — Z9221 Personal history of antineoplastic chemotherapy: Secondary | ICD-10-CM

## 2015-07-09 DIAGNOSIS — D631 Anemia in chronic kidney disease: Secondary | ICD-10-CM | POA: Diagnosis present

## 2015-07-10 ENCOUNTER — Encounter (HOSPITAL_COMMUNITY): Payer: Self-pay | Admitting: *Deleted

## 2015-07-10 ENCOUNTER — Inpatient Hospital Stay (HOSPITAL_COMMUNITY): Payer: Commercial Managed Care - HMO | Admitting: Certified Registered Nurse Anesthetist

## 2015-07-10 ENCOUNTER — Encounter (HOSPITAL_COMMUNITY)
Admission: EM | Disposition: A | Discharge status: Skilled Nursing Facility | Payer: Self-pay | Source: Home / Self Care | Attending: Internal Medicine

## 2015-07-10 ENCOUNTER — Inpatient Hospital Stay (HOSPITAL_COMMUNITY)
Admission: EM | Admit: 2015-07-10 | Discharge: 2015-07-16 | DRG: 330 | Disposition: A | Discharge status: Skilled Nursing Facility | Payer: Commercial Managed Care - HMO | Attending: Internal Medicine | Admitting: Internal Medicine

## 2015-07-10 ENCOUNTER — Emergency Department (HOSPITAL_COMMUNITY): Payer: Commercial Managed Care - HMO

## 2015-07-10 DIAGNOSIS — Z8249 Family history of ischemic heart disease and other diseases of the circulatory system: Secondary | ICD-10-CM | POA: Diagnosis not present

## 2015-07-10 DIAGNOSIS — R160 Hepatomegaly, not elsewhere classified: Secondary | ICD-10-CM | POA: Diagnosis present

## 2015-07-10 DIAGNOSIS — K5660 Unspecified intestinal obstruction: Secondary | ICD-10-CM | POA: Diagnosis present

## 2015-07-10 DIAGNOSIS — N183 Chronic kidney disease, stage 3 unspecified: Secondary | ICD-10-CM | POA: Diagnosis present

## 2015-07-10 DIAGNOSIS — F329 Major depressive disorder, single episode, unspecified: Secondary | ICD-10-CM | POA: Diagnosis not present

## 2015-07-10 DIAGNOSIS — Z90722 Acquired absence of ovaries, bilateral: Secondary | ICD-10-CM | POA: Diagnosis not present

## 2015-07-10 DIAGNOSIS — C787 Secondary malignant neoplasm of liver and intrahepatic bile duct: Secondary | ICD-10-CM | POA: Diagnosis present

## 2015-07-10 DIAGNOSIS — E038 Other specified hypothyroidism: Secondary | ICD-10-CM | POA: Diagnosis not present

## 2015-07-10 DIAGNOSIS — Z923 Personal history of irradiation: Secondary | ICD-10-CM | POA: Diagnosis not present

## 2015-07-10 DIAGNOSIS — E669 Obesity, unspecified: Secondary | ICD-10-CM | POA: Diagnosis present

## 2015-07-10 DIAGNOSIS — E785 Hyperlipidemia, unspecified: Secondary | ICD-10-CM | POA: Diagnosis present

## 2015-07-10 DIAGNOSIS — R109 Unspecified abdominal pain: Secondary | ICD-10-CM | POA: Diagnosis not present

## 2015-07-10 DIAGNOSIS — E876 Hypokalemia: Secondary | ICD-10-CM

## 2015-07-10 DIAGNOSIS — N289 Disorder of kidney and ureter, unspecified: Secondary | ICD-10-CM

## 2015-07-10 DIAGNOSIS — D638 Anemia in other chronic diseases classified elsewhere: Secondary | ICD-10-CM | POA: Diagnosis not present

## 2015-07-10 DIAGNOSIS — Z6830 Body mass index (BMI) 30.0-30.9, adult: Secondary | ICD-10-CM | POA: Diagnosis not present

## 2015-07-10 DIAGNOSIS — D631 Anemia in chronic kidney disease: Secondary | ICD-10-CM | POA: Diagnosis present

## 2015-07-10 DIAGNOSIS — C50512 Malignant neoplasm of lower-outer quadrant of left female breast: Secondary | ICD-10-CM | POA: Diagnosis not present

## 2015-07-10 DIAGNOSIS — Z8542 Personal history of malignant neoplasm of other parts of uterus: Secondary | ICD-10-CM | POA: Diagnosis not present

## 2015-07-10 DIAGNOSIS — I10 Essential (primary) hypertension: Secondary | ICD-10-CM | POA: Diagnosis not present

## 2015-07-10 DIAGNOSIS — E118 Type 2 diabetes mellitus with unspecified complications: Secondary | ICD-10-CM | POA: Diagnosis not present

## 2015-07-10 DIAGNOSIS — Z7984 Long term (current) use of oral hypoglycemic drugs: Secondary | ICD-10-CM | POA: Diagnosis not present

## 2015-07-10 DIAGNOSIS — E039 Hypothyroidism, unspecified: Secondary | ICD-10-CM | POA: Diagnosis present

## 2015-07-10 DIAGNOSIS — C784 Secondary malignant neoplasm of small intestine: Secondary | ICD-10-CM | POA: Diagnosis present

## 2015-07-10 DIAGNOSIS — K5669 Other intestinal obstruction: Secondary | ICD-10-CM | POA: Diagnosis not present

## 2015-07-10 DIAGNOSIS — D649 Anemia, unspecified: Secondary | ICD-10-CM

## 2015-07-10 DIAGNOSIS — D630 Anemia in neoplastic disease: Secondary | ICD-10-CM | POA: Diagnosis present

## 2015-07-10 DIAGNOSIS — R197 Diarrhea, unspecified: Secondary | ICD-10-CM | POA: Diagnosis not present

## 2015-07-10 DIAGNOSIS — K56609 Unspecified intestinal obstruction, unspecified as to partial versus complete obstruction: Secondary | ICD-10-CM | POA: Diagnosis present

## 2015-07-10 DIAGNOSIS — K219 Gastro-esophageal reflux disease without esophagitis: Secondary | ICD-10-CM | POA: Diagnosis present

## 2015-07-10 DIAGNOSIS — E1121 Type 2 diabetes mellitus with diabetic nephropathy: Secondary | ICD-10-CM | POA: Diagnosis present

## 2015-07-10 DIAGNOSIS — C50912 Malignant neoplasm of unspecified site of left female breast: Secondary | ICD-10-CM | POA: Diagnosis not present

## 2015-07-10 DIAGNOSIS — I129 Hypertensive chronic kidney disease with stage 1 through stage 4 chronic kidney disease, or unspecified chronic kidney disease: Secondary | ICD-10-CM | POA: Diagnosis present

## 2015-07-10 DIAGNOSIS — E1122 Type 2 diabetes mellitus with diabetic chronic kidney disease: Secondary | ICD-10-CM | POA: Diagnosis present

## 2015-07-10 DIAGNOSIS — Z9221 Personal history of antineoplastic chemotherapy: Secondary | ICD-10-CM | POA: Diagnosis not present

## 2015-07-10 DIAGNOSIS — Z833 Family history of diabetes mellitus: Secondary | ICD-10-CM | POA: Diagnosis not present

## 2015-07-10 DIAGNOSIS — Z90710 Acquired absence of both cervix and uterus: Secondary | ICD-10-CM | POA: Diagnosis not present

## 2015-07-10 DIAGNOSIS — E875 Hyperkalemia: Secondary | ICD-10-CM | POA: Diagnosis not present

## 2015-07-10 HISTORY — PX: LAPAROTOMY: SHX154

## 2015-07-10 HISTORY — PX: BOWEL RESECTION: SHX1257

## 2015-07-10 LAB — BASIC METABOLIC PANEL
Anion gap: 12 (ref 5–15)
BUN: 14 mg/dL (ref 6–20)
CALCIUM: 8.9 mg/dL (ref 8.9–10.3)
CO2: 26 mmol/L (ref 22–32)
Chloride: 106 mmol/L (ref 101–111)
Creatinine, Ser: 1.2 mg/dL — ABNORMAL HIGH (ref 0.44–1.00)
GFR calc Af Amer: 52 mL/min — ABNORMAL LOW (ref >60)
GFR, EST NON AFRICAN AMERICAN: 45 mL/min — AB (ref >60)
GLUCOSE: 190 mg/dL — AB (ref 65–99)
Potassium: 2.7 mmol/L — CL (ref 3.5–5.1)
Sodium: 144 mmol/L (ref 135–145)

## 2015-07-10 LAB — URINALYSIS, ROUTINE W REFLEX MICROSCOPIC
BILIRUBIN URINE: NEGATIVE
GLUCOSE, UA: NEGATIVE mg/dL
HGB URINE DIPSTICK: NEGATIVE
Ketones, ur: 15 mg/dL — AB
Leukocytes, UA: NEGATIVE
Nitrite: NEGATIVE
PROTEIN: NEGATIVE mg/dL
Specific Gravity, Urine: 1.019 (ref 1.005–1.030)
pH: 5.5 (ref 5.0–8.0)

## 2015-07-10 LAB — DIFFERENTIAL
Basophils Absolute: 0 10*3/uL (ref 0.0–0.1)
Basophils Relative: 0 %
EOS ABS: 0.2 10*3/uL (ref 0.0–0.7)
EOS PCT: 2 %
LYMPHS ABS: 0.9 10*3/uL (ref 0.7–4.0)
Lymphocytes Relative: 10 %
MONO ABS: 0.5 10*3/uL (ref 0.1–1.0)
Monocytes Relative: 6 %
NEUTROS PCT: 82 %
Neutro Abs: 7.3 10*3/uL (ref 1.7–7.7)

## 2015-07-10 LAB — CBC
HCT: 37 % (ref 36.0–46.0)
HEMATOCRIT: 33 % — AB (ref 36.0–46.0)
Hemoglobin: 11.1 g/dL — ABNORMAL LOW (ref 12.0–15.0)
Hemoglobin: 9.8 g/dL — ABNORMAL LOW (ref 12.0–15.0)
MCH: 24.9 pg — AB (ref 26.0–34.0)
MCH: 25.6 pg — AB (ref 26.0–34.0)
MCHC: 29.7 g/dL — AB (ref 30.0–36.0)
MCHC: 30 g/dL (ref 30.0–36.0)
MCV: 84 fL (ref 78.0–100.0)
MCV: 85.5 fL (ref 78.0–100.0)
PLATELETS: 285 10*3/uL (ref 150–400)
PLATELETS: 346 10*3/uL (ref 150–400)
RBC: 3.93 MIL/uL (ref 3.87–5.11)
RBC: 4.33 MIL/uL (ref 3.87–5.11)
RDW: 17.5 % — ABNORMAL HIGH (ref 11.5–15.5)
RDW: 17.7 % — AB (ref 11.5–15.5)
WBC: 7.6 10*3/uL (ref 4.0–10.5)
WBC: 9 10*3/uL (ref 4.0–10.5)

## 2015-07-10 LAB — GLUCOSE, CAPILLARY
GLUCOSE-CAPILLARY: 169 mg/dL — AB (ref 65–99)
GLUCOSE-CAPILLARY: 146 mg/dL — AB (ref 65–99)
Glucose-Capillary: 100 mg/dL — ABNORMAL HIGH (ref 65–99)
Glucose-Capillary: 115 mg/dL — ABNORMAL HIGH (ref 65–99)
Glucose-Capillary: 127 mg/dL — ABNORMAL HIGH (ref 65–99)

## 2015-07-10 LAB — TYPE AND SCREEN
ABO/RH(D): O POS
Antibody Screen: NEGATIVE

## 2015-07-10 LAB — COMPREHENSIVE METABOLIC PANEL
ALBUMIN: 2.9 g/dL — AB (ref 3.5–5.0)
ALK PHOS: 252 U/L — AB (ref 38–126)
ALT: 48 U/L (ref 14–54)
AST: 19 U/L (ref 15–41)
Anion gap: 14 (ref 5–15)
BUN: 14 mg/dL (ref 6–20)
CALCIUM: 9.4 mg/dL (ref 8.9–10.3)
CO2: 23 mmol/L (ref 22–32)
CREATININE: 1.36 mg/dL — AB (ref 0.44–1.00)
Chloride: 104 mmol/L (ref 101–111)
GFR calc Af Amer: 45 mL/min — ABNORMAL LOW (ref >60)
GFR calc non Af Amer: 38 mL/min — ABNORMAL LOW (ref >60)
GLUCOSE: 203 mg/dL — AB (ref 65–99)
Potassium: 2.9 mmol/L — ABNORMAL LOW (ref 3.5–5.1)
SODIUM: 141 mmol/L (ref 135–145)
Total Bilirubin: 0.6 mg/dL (ref 0.3–1.2)
Total Protein: 6.8 g/dL (ref 6.5–8.1)

## 2015-07-10 LAB — SURGICAL PCR SCREEN
MRSA, PCR: NEGATIVE
Staphylococcus aureus: NEGATIVE

## 2015-07-10 LAB — ABO/RH: ABO/RH(D): O POS

## 2015-07-10 LAB — LIPASE, BLOOD: Lipase: 30 U/L (ref 11–51)

## 2015-07-10 LAB — MAGNESIUM: MAGNESIUM: 1.5 mg/dL — AB (ref 1.7–2.4)

## 2015-07-10 SURGERY — LAPAROTOMY, EXPLORATORY
Anesthesia: General | Site: Abdomen

## 2015-07-10 MED ORDER — ONDANSETRON HCL 4 MG/2ML IJ SOLN
INTRAMUSCULAR | Status: AC
  Filled 2015-07-10: qty 2

## 2015-07-10 MED ORDER — POTASSIUM CHLORIDE 10 MEQ/100ML IV SOLN
10.0000 meq | Freq: Once | INTRAVENOUS | Status: AC
  Administered 2015-07-10: 10 meq via INTRAVENOUS
  Filled 2015-07-10: qty 100

## 2015-07-10 MED ORDER — ONDANSETRON HCL 4 MG/2ML IJ SOLN: 4.0000 mg | Freq: Four times a day (QID) | INTRAMUSCULAR | Status: DC | PRN

## 2015-07-10 MED ORDER — SODIUM CHLORIDE 0.45 % IV SOLN
INTRAVENOUS | Status: DC
  Administered 2015-07-10 – 2015-07-12 (×4): via INTRAVENOUS
  Filled 2015-07-10 (×8): qty 1000

## 2015-07-10 MED ORDER — SUCCINYLCHOLINE CHLORIDE 20 MG/ML IJ SOLN
INTRAMUSCULAR | Status: DC | PRN
  Administered 2015-07-10: 120 mg via INTRAVENOUS

## 2015-07-10 MED ORDER — NALOXONE HCL 0.4 MG/ML IJ SOLN: 0.4000 mg | INTRAMUSCULAR | Status: DC | PRN

## 2015-07-10 MED ORDER — FENTANYL CITRATE (PF) 250 MCG/5ML IJ SOLN
INTRAMUSCULAR | Status: AC
  Filled 2015-07-10: qty 5

## 2015-07-10 MED ORDER — DIPHENHYDRAMINE HCL 50 MG/ML IJ SOLN: 12.5000 mg | Freq: Four times a day (QID) | INTRAMUSCULAR | Status: DC | PRN

## 2015-07-10 MED ORDER — FENTANYL CITRATE (PF) 100 MCG/2ML IJ SOLN
INTRAMUSCULAR | Status: AC
  Administered 2015-07-10: 50 ug via INTRAVENOUS
  Filled 2015-07-10: qty 2

## 2015-07-10 MED ORDER — PANTOPRAZOLE SODIUM 40 MG PO TBEC
40.0000 mg | DELAYED_RELEASE_TABLET | Freq: Every day | ORAL | Status: DC
  Administered 2015-07-10 – 2015-07-16 (×7): 40 mg via ORAL
  Filled 2015-07-10 (×7): qty 1

## 2015-07-10 MED ORDER — ONDANSETRON HCL 4 MG/2ML IJ SOLN
INTRAMUSCULAR | Status: DC | PRN
  Administered 2015-07-10: 4 mg via INTRAVENOUS

## 2015-07-10 MED ORDER — POTASSIUM CHLORIDE 10 MEQ/100ML IV SOLN
INTRAVENOUS | Status: AC
  Filled 2015-07-10: qty 100

## 2015-07-10 MED ORDER — ROCURONIUM BROMIDE 100 MG/10ML IV SOLN
INTRAVENOUS | Status: DC | PRN
  Administered 2015-07-10: 30 mg via INTRAVENOUS

## 2015-07-10 MED ORDER — MORPHINE SULFATE (PF) 4 MG/ML IV SOLN
4.0000 mg | Freq: Once | INTRAVENOUS | Status: AC
  Administered 2015-07-10: 4 mg via INTRAVENOUS
  Filled 2015-07-10: qty 1

## 2015-07-10 MED ORDER — POTASSIUM CHLORIDE 10 MEQ/100ML IV SOLN
10.0000 meq | INTRAVENOUS | Status: AC
  Administered 2015-07-10 (×2): 10 meq via INTRAVENOUS
  Filled 2015-07-10 (×2): qty 100

## 2015-07-10 MED ORDER — 0.9 % SODIUM CHLORIDE (POUR BTL) OPTIME
TOPICAL | Status: DC | PRN
  Administered 2015-07-10 (×4): 1000 mL

## 2015-07-10 MED ORDER — FENTANYL CITRATE (PF) 100 MCG/2ML IJ SOLN
25.0000 ug | INTRAMUSCULAR | Status: DC | PRN
  Administered 2015-07-10 (×2): 50 ug via INTRAVENOUS

## 2015-07-10 MED ORDER — ROCURONIUM BROMIDE 50 MG/5ML IV SOLN
INTRAVENOUS | Status: AC
Start: 2015-07-10 — End: 2015-07-10
  Filled 2015-07-10: qty 1

## 2015-07-10 MED ORDER — ESCITALOPRAM OXALATE 10 MG PO TABS
10.0000 mg | ORAL_TABLET | Freq: Every day | ORAL | Status: DC
  Administered 2015-07-10 – 2015-07-16 (×7): 10 mg via ORAL
  Filled 2015-07-10 (×7): qty 1

## 2015-07-10 MED ORDER — PROPOFOL 10 MG/ML IV BOLUS
INTRAVENOUS | Status: AC
  Filled 2015-07-10: qty 20

## 2015-07-10 MED ORDER — POTASSIUM CHLORIDE 10 MEQ/100ML IV SOLN: 10.0000 meq | INTRAVENOUS | Status: DC

## 2015-07-10 MED ORDER — LIDOCAINE HCL (CARDIAC) 20 MG/ML IV SOLN
INTRAVENOUS | Status: DC | PRN
  Administered 2015-07-10: 100 mg via INTRAVENOUS

## 2015-07-10 MED ORDER — ATORVASTATIN CALCIUM 20 MG PO TABS
20.0000 mg | ORAL_TABLET | Freq: Every day | ORAL | Status: DC
  Administered 2015-07-10: 20 mg via ORAL
  Filled 2015-07-10: qty 2

## 2015-07-10 MED ORDER — ROCURONIUM BROMIDE 50 MG/5ML IV SOLN
INTRAVENOUS | Status: AC
  Filled 2015-07-10: qty 1

## 2015-07-10 MED ORDER — MIDAZOLAM HCL 2 MG/2ML IJ SOLN
INTRAMUSCULAR | Status: AC
  Filled 2015-07-10: qty 2

## 2015-07-10 MED ORDER — SODIUM CHLORIDE 0.9 % IV SOLN: INTRAVENOUS | Status: DC

## 2015-07-10 MED ORDER — DIPHENHYDRAMINE HCL 12.5 MG/5ML PO ELIX: 12.5000 mg | ORAL_SOLUTION | Freq: Four times a day (QID) | ORAL | Status: DC | PRN

## 2015-07-10 MED ORDER — LEVOTHYROXINE SODIUM 112 MCG PO TABS
112.0000 ug | ORAL_TABLET | Freq: Every day | ORAL | Status: DC
  Administered 2015-07-10 – 2015-07-16 (×7): 112 ug via ORAL
  Filled 2015-07-10 (×7): qty 1

## 2015-07-10 MED ORDER — INSULIN ASPART 100 UNIT/ML ~~LOC~~ SOLN
0.0000 [IU] | SUBCUTANEOUS | Status: DC
  Administered 2015-07-10 – 2015-07-11 (×2): 3 [IU] via SUBCUTANEOUS
  Administered 2015-07-11 (×2): 2 [IU] via SUBCUTANEOUS
  Administered 2015-07-11: 3 [IU] via SUBCUTANEOUS
  Administered 2015-07-11: 2 [IU] via SUBCUTANEOUS
  Administered 2015-07-12: 1 [IU] via SUBCUTANEOUS
  Administered 2015-07-12: 2 [IU] via SUBCUTANEOUS
  Administered 2015-07-13: 3 [IU] via SUBCUTANEOUS
  Administered 2015-07-13: 2 [IU] via SUBCUTANEOUS
  Administered 2015-07-13: 3 [IU] via SUBCUTANEOUS
  Administered 2015-07-14: 5 [IU] via SUBCUTANEOUS
  Administered 2015-07-14: 3 [IU] via SUBCUTANEOUS
  Administered 2015-07-14: 5 [IU] via SUBCUTANEOUS
  Administered 2015-07-14: 2 [IU] via SUBCUTANEOUS
  Administered 2015-07-14 – 2015-07-15 (×3): 3 [IU] via SUBCUTANEOUS
  Administered 2015-07-15: 2 [IU] via SUBCUTANEOUS
  Administered 2015-07-15 – 2015-07-16 (×4): 3 [IU] via SUBCUTANEOUS

## 2015-07-10 MED ORDER — GLYCOPYRROLATE 0.2 MG/ML IV SOSY
PREFILLED_SYRINGE | INTRAVENOUS | Status: AC
  Filled 2015-07-10: qty 3

## 2015-07-10 MED ORDER — METHOCARBAMOL 1000 MG/10ML IJ SOLN
500.0000 mg | Freq: Four times a day (QID) | INTRAVENOUS | Status: DC | PRN
  Filled 2015-07-10: qty 5

## 2015-07-10 MED ORDER — FENTANYL CITRATE (PF) 100 MCG/2ML IJ SOLN
INTRAMUSCULAR | Status: DC | PRN
  Administered 2015-07-10 (×3): 50 ug via INTRAVENOUS

## 2015-07-10 MED ORDER — METOPROLOL TARTRATE 25 MG PO TABS
25.0000 mg | ORAL_TABLET | Freq: Two times a day (BID) | ORAL | Status: DC
Start: 2015-07-10 — End: 2015-07-16
  Administered 2015-07-10 – 2015-07-16 (×11): 25 mg via ORAL
  Filled 2015-07-10 (×11): qty 1

## 2015-07-10 MED ORDER — ARTIFICIAL TEARS OP OINT
TOPICAL_OINTMENT | OPHTHALMIC | Status: AC
  Filled 2015-07-10: qty 3.5

## 2015-07-10 MED ORDER — ALPRAZOLAM 0.25 MG PO TABS: 0.2500 mg | ORAL_TABLET | Freq: Every evening | ORAL | Status: DC | PRN

## 2015-07-10 MED ORDER — SODIUM CHLORIDE 0.9 % IV SOLN
INTRAVENOUS | Status: DC
  Administered 2015-07-10: 02:00:00 via INTRAVENOUS

## 2015-07-10 MED ORDER — SUGAMMADEX SODIUM 200 MG/2ML IV SOLN
INTRAVENOUS | Status: DC | PRN
Start: 2015-07-10 — End: 2015-07-10
  Administered 2015-07-10: 150 mg via INTRAVENOUS
  Administered 2015-07-10: 50 mg via INTRAVENOUS

## 2015-07-10 MED ORDER — ONDANSETRON HCL 4 MG/2ML IJ SOLN
4.0000 mg | Freq: Once | INTRAMUSCULAR | Status: DC | PRN
Start: 2015-07-10 — End: 2015-07-10

## 2015-07-10 MED ORDER — CEFAZOLIN SODIUM-DEXTROSE 2-3 GM-% IV SOLR
INTRAVENOUS | Status: DC | PRN
  Administered 2015-07-10: 2 g via INTRAVENOUS

## 2015-07-10 MED ORDER — MORPHINE SULFATE 2 MG/ML IV SOLN
INTRAVENOUS | Status: DC
  Administered 2015-07-10: 2 mg via INTRAVENOUS
  Administered 2015-07-10: 6 mg via INTRAVENOUS
  Administered 2015-07-10: 5 mg via INTRAVENOUS
  Administered 2015-07-10: 15:00:00 via INTRAVENOUS
  Administered 2015-07-11: 4 mg via INTRAVENOUS
  Administered 2015-07-11: 5 mg via INTRAVENOUS
  Administered 2015-07-11: 1 mg via INTRAVENOUS
  Administered 2015-07-11: 2 mg via INTRAVENOUS
  Administered 2015-07-12: 1 mg via INTRAVENOUS
  Administered 2015-07-12: 2 mg via INTRAVENOUS
  Administered 2015-07-12: 1 mg via INTRAVENOUS

## 2015-07-10 MED ORDER — ENOXAPARIN SODIUM 40 MG/0.4ML ~~LOC~~ SOLN
40.0000 mg | SUBCUTANEOUS | Status: DC
  Administered 2015-07-11 – 2015-07-16 (×6): 40 mg via SUBCUTANEOUS
  Filled 2015-07-10 (×6): qty 0.4

## 2015-07-10 MED ORDER — LACTATED RINGERS IV SOLN
INTRAVENOUS | Status: DC
  Administered 2015-07-10 (×3): via INTRAVENOUS

## 2015-07-10 MED ORDER — PROPOFOL 10 MG/ML IV BOLUS
INTRAVENOUS | Status: DC | PRN
  Administered 2015-07-10: 120 mg via INTRAVENOUS

## 2015-07-10 MED ORDER — MORPHINE SULFATE 2 MG/ML IV SOLN
INTRAVENOUS | Status: AC
  Filled 2015-07-10: qty 25

## 2015-07-10 MED ORDER — SODIUM CHLORIDE 0.9% FLUSH: 9.0000 mL | INTRAVENOUS | Status: DC | PRN

## 2015-07-10 MED ORDER — EPHEDRINE SULFATE-NACL 50-0.9 MG/10ML-% IV SOSY
PREFILLED_SYRINGE | INTRAVENOUS | Status: DC | PRN
  Administered 2015-07-10: 10 mg via INTRAVENOUS

## 2015-07-10 MED ORDER — NEOSTIGMINE METHYLSULFATE 5 MG/5ML IV SOSY
PREFILLED_SYRINGE | INTRAVENOUS | Status: AC
  Filled 2015-07-10: qty 5

## 2015-07-10 MED ORDER — MIDAZOLAM HCL 5 MG/5ML IJ SOLN
INTRAMUSCULAR | Status: DC | PRN
  Administered 2015-07-10: 2 mg via INTRAVENOUS

## 2015-07-10 MED ORDER — ONDANSETRON HCL 4 MG/2ML IJ SOLN
4.0000 mg | Freq: Once | INTRAMUSCULAR | Status: AC
  Administered 2015-07-10: 4 mg via INTRAVENOUS
  Filled 2015-07-10: qty 2

## 2015-07-10 SURGICAL SUPPLY — 47 items
BLADE SURG ROTATE 9660 (MISCELLANEOUS) IMPLANT
CANISTER SUCTION 2500CC (MISCELLANEOUS) ×3 IMPLANT
CHLORAPREP W/TINT 26ML (MISCELLANEOUS) ×3 IMPLANT
COVER SURGICAL LIGHT HANDLE (MISCELLANEOUS) ×3 IMPLANT
DRAPE LAPAROSCOPIC ABDOMINAL (DRAPES) ×3 IMPLANT
DRAPE WARM FLUID 44X44 (DRAPE) ×3 IMPLANT
DRSG OPSITE POSTOP 4X10 (GAUZE/BANDAGES/DRESSINGS) ×2 IMPLANT
DRSG OPSITE POSTOP 4X8 (GAUZE/BANDAGES/DRESSINGS) IMPLANT
ELECT BLADE 6.5 EXT (BLADE) ×2 IMPLANT
ELECT CAUTERY BLADE 6.4 (BLADE) ×3 IMPLANT
ELECT REM PT RETURN 9FT ADLT (ELECTROSURGICAL) ×3
ELECTRODE REM PT RTRN 9FT ADLT (ELECTROSURGICAL) ×1 IMPLANT
GLOVE BIO SURGEON STRL SZ 6.5 (GLOVE) ×1 IMPLANT
GLOVE BIO SURGEON STRL SZ8 (GLOVE) ×5 IMPLANT
GLOVE BIO SURGEONS STRL SZ 6.5 (GLOVE) ×1
GLOVE BIOGEL PI IND STRL 6.5 (GLOVE) IMPLANT
GLOVE BIOGEL PI IND STRL 8 (GLOVE) ×1 IMPLANT
GLOVE BIOGEL PI IND STRL 8.5 (GLOVE) IMPLANT
GLOVE BIOGEL PI INDICATOR 6.5 (GLOVE) ×2
GLOVE BIOGEL PI INDICATOR 8 (GLOVE) ×2
GLOVE BIOGEL PI INDICATOR 8.5 (GLOVE) ×2
GOWN STRL REUS W/ TWL LRG LVL3 (GOWN DISPOSABLE) ×1 IMPLANT
GOWN STRL REUS W/ TWL XL LVL3 (GOWN DISPOSABLE) ×1 IMPLANT
GOWN STRL REUS W/TWL LRG LVL3 (GOWN DISPOSABLE) ×3
GOWN STRL REUS W/TWL XL LVL3 (GOWN DISPOSABLE) ×6
KIT BASIN OR (CUSTOM PROCEDURE TRAY) ×3 IMPLANT
KIT ROOM TURNOVER OR (KITS) ×3 IMPLANT
LIGASURE IMPACT 36 18CM CVD LR (INSTRUMENTS) ×2 IMPLANT
NS IRRIG 1000ML POUR BTL (IV SOLUTION) ×6 IMPLANT
PACK GENERAL/GYN (CUSTOM PROCEDURE TRAY) ×3 IMPLANT
PAD ARMBOARD 7.5X6 YLW CONV (MISCELLANEOUS) ×3 IMPLANT
RELOAD PROXIMATE 75MM BLUE (ENDOMECHANICALS) ×9 IMPLANT
RELOAD STAPLE 75 3.8 BLU REG (ENDOMECHANICALS) IMPLANT
SPECIMEN JAR LARGE (MISCELLANEOUS) IMPLANT
SPONGE LAP 18X18 X RAY DECT (DISPOSABLE) IMPLANT
STAPLER GUN LINEAR PROX 60 (STAPLE) ×2 IMPLANT
STAPLER PROXIMATE 75MM BLUE (STAPLE) ×2 IMPLANT
STAPLER VISISTAT 35W (STAPLE) ×3 IMPLANT
SUCTION POOLE TIP (SUCTIONS) ×3 IMPLANT
SUT PDS AB 1 TP1 96 (SUTURE) ×6 IMPLANT
SUT SILK 2 0 SH CR/8 (SUTURE) ×3 IMPLANT
SUT SILK 2 0 TIES 10X30 (SUTURE) ×3 IMPLANT
SUT SILK 3 0 SH CR/8 (SUTURE) ×3 IMPLANT
SUT SILK 3 0 TIES 10X30 (SUTURE) ×3 IMPLANT
TOWEL OR 17X26 10 PK STRL BLUE (TOWEL DISPOSABLE) ×3 IMPLANT
TRAY FOLEY CATH 16FRSI W/METER (SET/KITS/TRAYS/PACK) IMPLANT
YANKAUER SUCT BULB TIP NO VENT (SUCTIONS) ×2 IMPLANT

## 2015-07-10 NOTE — Progress Notes (Signed)
Patient seen and examined, admitted by Dr. Alcario Drought this morning.  Briefly 70 year old female with history of breast cancer, in remission, hysterectomy, presented to ED for the third time in 3 weeks with epigastric pain, fullness, nausea and vomiting. Imaging skull consistent with small bowel obstruction.  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 Georganna Skeans if liver biopsy feasible during the surgery. Per Dr. Grandville Silos, it may be difficult and deep and may need to be done later. - Discussed with patient's family, she will need appointment with her oncologist, Dr. Lindi Adie  Hypokalemia Replaced  Discussed in detail with multiple family members in the room and the patient   Josha Weekley M.D. [facility] 07/10/2015, 12:02 PM  Pager: [phone]

## 2015-07-10 NOTE — Transfer of Care (Signed)
Immediate Anesthesia Transfer of Care Note  Patient: Public affairs consultant  Procedure(s) Performed: Procedure(s): EXPLORATORY LAPAROTOMY  (N/A) SMALL BOWEL RESECTION (N/A)  Patient Location: PACU  Anesthesia Type:General  Level of Consciousness: sedated  Airway & Oxygen Therapy: Patient Spontanous Breathing and Patient connected to face mask oxygen  Post-op Assessment: Report given to RN and Post -op Vital signs reviewed and stable  Post vital signs: Reviewed and stable  Last Vitals:  Filed Vitals:   07/10/15 0617 07/10/15 0935  BP: 148/74 98/51  Pulse: 84 71  Temp: 36.7 C 36.6 C  Resp:  18    Last Pain:  Filed Vitals:   07/10/15 0936  PainSc: 0-No pain         Complications: No apparent anesthesia complications

## 2015-07-10 NOTE — Op Note (Signed)
07/10/2015  12:46 PM  PATIENT:  Monica Morrison  70 y.o. 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  ASSISTANTS: Judeth Horn, M.D.   ANESTHESIA:   general  EBL:  Total I/O In: 1000 [I.V.:1000] Out: -   BLOOD ADMINISTERED:none  DRAINS: none   SPECIMEN:  Excision  DISPOSITION OF SPECIMEN:  PATHOLOGY  COUNTS:  YES  DICTATION: .Dragon Dictation Findings: Small bowel obstruction due to tumor, appearance consistent with adenocarcinoma. 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 Less likely is primary small bowel adenocarcinoma.  Procedure in detail: [name] presents for exploratory laparotomy for recurrent recent small bowel obstructions. Informed consent was obtained. She received intravenous antibiotic. She was brought to the operating room and general endotracheal anesthesia was administered by the anesthesia staff. Her abdomen was prepped and draped in sterile fashion. We did time out procedure. Midline incision was made. Subcutaneous tissues were dissected down revealing the anterior fascia. This was identified and divided along the midline.Peritoneal Cavity was entered under direct vision without difficulty. The fascia was gradually opened to the length of the incision and there were no significant adhesions. Exploration easily identified the area of obstruction. There was tumor involving the small bowel causing obstruction. 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 Small bowel was divided proximal and distal to the affected area with GIA-75 stapler. Mesentery was taken  with LigaSure. Sutures were used to ensure good hemostasis along the mesentery. Specimen was marked for pathology and sent. Further exploration of the abdomen revealed decompressed small bowel all the way to the terminal ileum. No other significant peritoneal studding or pelvic mass was noted. The liver mass as seen on CT was not palpable. No other liver masses were felt. The staple line of the proximal portion appeared little dusky so we resected it back further with another firing of the GIA-75 stapler. It was nice and pink at this time.We did a side-to-side small bowel anastomosis with GIA-75 stapler. Common defect was closed with TA 60. Mesenteric defect was closed with 2-0 silk. Apical sutures of 2-0 silk were placed. We changed our gloves. Abdomen was irrigated with warm saline. Hemostasis was ensured. Bowel was returned to anatomic position. Fascia was closed with running #1 looped PDS and the skin was closed with staples after irrigating subcutaneous tissues. All counts were correct. She tolerated procedure well without apparent complication and was taken recovery in stable condition. PATIENT DISPOSITION:  PACU - hemodynamically stable.   Delay start of Pharmacological VTE agent (>24hrs) due to surgical blood loss or risk of bleeding:  no  Georganna Skeans, MD, MPH, FACS Pager: [phone]  5/19/201712:46 PM

## 2015-07-10 NOTE — Progress Notes (Addendum)
CRITICAL VALUE ALERT  Critical value received:  K=2.7  Date of notification: 07/10/15  Time of notification:  7:10am  Critical value read back:yes  Nurse who received alert:  Bubba Hales  MD notified (1st page): Dr. Posey Pronto  Time of first page: 7:14am  MD notified (2nd page):  Time of second page:  Responding MD:    Time MD responded:

## 2015-07-10 NOTE — H&P (Signed)
History and Physical    Monica Morrison BDZ:[id number] DOB: 1945/05/16 DOA: 07/10/2015  Referring MD/NP/PA: Dr. Roxanne Mins PCP: Gara Kroner, MD Outpatient Specialists: [facility] Patient coming from: Home  Chief Complaint: N/V, abd pain  HPI: Monica Morrison is a 70 y.o. female with medical history significant of BRCA in 06, stage 3A believed to be in remission after surgery, radiation, and chemotherapy, endometrial carcinoma Stage 2 believed to be in remission after a 2012 hysterectomy.  Patient presents to the ED for the 3rd time in 3 weeks with epigastric pain, fullness, N/V.  States that she feels like she once again has SBO.  She has already been admitted 2 times in the past 3 weeks for bouts of SBO.  CT scan on the 8th was suspicious for lymph nodes about the site of obstruction and suspicious liver lesion as well.  ED Course: In the ED, work up once again, showed SBO.  Dr. Georgette Dover is at bed side and I am admitting patient.  Due to ongoing projectile vomiting we are putting an NGT in.  Review of Systems: As per HPI otherwise 10 point review of systems negative.    Past Medical History  Diagnosis Date  . Cancer (HCC)     breast  . Hypertension   . Diabetes mellitus   . Arthritis   . Breast cancer ([place])   . CKD (chronic kidney disease)   . Hypothyroidism 06/22/2015    Past Surgical History  Procedure Laterality Date  . Abdominal hysterectomy    . Breast surgery       reports that she has never smoked. She does not have any smokeless tobacco history on file. She reports that she does not drink alcohol or use illicit drugs.  No Known Allergies  Family History  Problem Relation Age of Onset  . Diabetes Mother   . Hypertension Mother   . Diabetes Sister   . Hypertension Sister      Prior to Admission medications   Medication Sig Start Date End Date Taking? Authorizing Provider  alendronate (FOSAMAX) 70 MG tablet Take 70 mg by mouth every 7 (seven) days. On Sunday   Yes  Historical Provider, MD  ALPRAZolam Duanne Moron) 0.25 MG tablet Take 0.25 mg by mouth at bedtime as needed.   Yes Historical Provider, MD  atorvastatin (LIPITOR) 20 MG tablet Take 20 mg by mouth daily.   Yes Historical Provider, MD  bismuth subsalicylate (PEPTO BISMOL) 262 MG/15ML suspension USE AS DIRECTED FOR LOOSE STOOLS. Patient taking differently: Take 30 mLs by mouth every 4 (four) hours as needed for diarrhea or loose stools.  06/22/15  Yes Earnstine Regal, PA-C  Calcium Carbonate (CALCIUM 500 PO) Take 500 mg by mouth daily.    Yes Historical Provider, MD  escitalopram (LEXAPRO) 10 MG tablet Take 10 mg by mouth daily.  01/06/15  Yes Historical Provider, MD  glipiZIDE (GLUCOTROL) 5 MG tablet Take 10 mg by mouth daily.    Yes Historical Provider, MD  levothyroxine (SYNTHROID, LEVOTHROID) 112 MCG tablet Take 112 mcg by mouth daily.     Yes Historical Provider, MD  metoprolol tartrate (LOPRESSOR) 25 MG tablet Take 1 tablet (25 mg total) by mouth 2 (two) times daily. 06/22/15  Yes Earnstine Regal, PA-C  Multiple Vitamin (MULTIVITAMIN WITH MINERALS) TABS tablet Take 1 tablet by mouth daily.   Yes Historical Provider, MD  omeprazole (PRILOSEC) 20 MG capsule Take 20 mg by mouth daily.  01/21/14  Yes Historical Provider, MD  pioglitazone (ACTOS) 15 MG tablet  Take 15 mg by mouth daily.     Yes Historical Provider, MD  saccharomyces boulardii (FLORASTOR) 250 MG capsule YOU CAN BUY THIS AT ANY DRUG STORE, AND FOLLOW DIRECTIONS ON PACKAGE.  USE FOR THE NEXT 2-3 WEEKS TILL YOUR STOOLS ARE NORMAL. Patient taking differently: Take 250 mg by mouth 2 (two) times daily.  06/22/15  Yes Earnstine Regal, PA-C  ONE TOUCH ULTRA TEST test strip  01/03/13   Historical Provider, MD    Physical Exam: Filed Vitals:   07/10/15 0003 07/10/15 0033 07/10/15 0036 07/10/15 0045  BP: 182/106 185/99  146/93  Pulse: 96  89 81  Temp: 98.4 F (36.9 C)     TempSrc: Oral     Resp: 18   16  Height: _0  (1.575 m)     Weight: 75.751  kg (167 lb)     SpO2: 99%  100% 100%      Constitutional: NAD, calm, comfortable Filed Vitals:   07/10/15 0003 07/10/15 0033 07/10/15 0036 07/10/15 0045  BP: 182/106 185/99  146/93  Pulse: 96  89 81  Temp: 98.4 F (36.9 C)     TempSrc: Oral     Resp: 18   16  Height: _1  (1.575 m)     Weight: 75.751 kg (167 lb)     SpO2: 99%  100% 100%   Eyes: PERRL, lids and conjunctivae normal ENMT: Mucous membranes are moist. Posterior pharynx clear of any exudate or lesions.Normal dentition.  Neck: normal, supple, no masses, no thyromegaly Respiratory: clear to auscultation bilaterally, no wheezing, no crackles. Normal respiratory effort. No accessory muscle use.  Cardiovascular: Regular rate and rhythm, no murmurs / rubs / gallops. No extremity edema. 2+ pedal pulses. No carotid bruits.  Abdomen: no tenderness, no masses palpated. No hepatosplenomegaly. Bowel sounds positive.  Musculoskeletal: no clubbing / cyanosis. No joint deformity upper and lower extremities. Good ROM, no contractures. Normal muscle tone.  Skin: no rashes, lesions, ulcers. No induration Neurologic: CN 2-12 grossly intact. Sensation intact, DTR normal. Strength 5/5 in all 4.  Psychiatric: Normal judgment and insight. Alert and oriented x 3. Normal mood.    Labs on Admission: I have personally reviewed following labs and imaging studies  CBC:  Recent Labs Lab 07/03/15 1423 07/04/15 0509 07/10/15 0018  WBC 7.5 7.3 9.0  NEUTROABS 6.1 5.8 7.3  HGB 10.9* 10.1* 11.1*  HCT 34.9* 32.7* 37.0  MCV 84.7 83.6 85.5  PLT 211 223 573   Basic Metabolic Panel:  Recent Labs Lab 07/03/15 2033 07/04/15 0509 07/10/15 0018  NA 139 140 141  K 3.5 3.0* 2.9*  CL 99* 103 104  CO2 _2 GLUCOSE 223* 187* 203*  BUN _3 CREATININE 1.44* 1.34* 1.36*  CALCIUM 8.6* 8.4* 9.4   GFR: Estimated Creatinine Clearance: 36.7 mL/min (by C-G formula based on Cr of 1.36). Liver Function Tests:  Recent Labs Lab  07/10/15 0018  AST 19  ALT 48  ALKPHOS 252*  BILITOT 0.6  PROT 6.8  ALBUMIN 2.9*    Recent Labs Lab 07/10/15 0018  LIPASE 30   No results for input(s): AMMONIA in the last 168 hours. Coagulation Profile: No results for input(s): INR, PROTIME in the last 168 hours. Cardiac Enzymes: No results for input(s): CKTOTAL, CKMB, CKMBINDEX, TROPONINI in the last 168 hours. BNP (last 3 results) No results for input(s): PROBNP in the last 8760 hours. HbA1C: No results for input(s): HGBA1C in the last 72 hours. CBG:  Recent Labs Lab 07/03/15 2022 07/03/15 2337 07/04/15 0420 07/04/15 0752 07/04/15 1215  GLUCAP 209* 200* 192* 195* 265*   Lipid Profile: No results for input(s): CHOL, HDL, LDLCALC, TRIG, CHOLHDL, LDLDIRECT in the last 72 hours. Thyroid Function Tests: No results for input(s): TSH, T4TOTAL, FREET4, T3FREE, THYROIDAB in the last 72 hours. Anemia Panel: No results for input(s): VITAMINB12, FOLATE, FERRITIN, TIBC, IRON, RETICCTPCT in the last 72 hours. Urine analysis:    Component Value Date/Time   COLORURINE YELLOW 07/10/2015 0018   APPEARANCEUR CLEAR 07/10/2015 0018   LABSPEC 1.019 07/10/2015 0018   PHURINE 5.5 07/10/2015 0018   GLUCOSEU NEGATIVE 07/10/2015 0018   HGBUR NEGATIVE 07/10/2015 0018   BILIRUBINUR NEGATIVE 07/10/2015 0018   KETONESUR 15* 07/10/2015 0018   PROTEINUR NEGATIVE 07/10/2015 0018   NITRITE NEGATIVE 07/10/2015 0018   LEUKOCYTESUR NEGATIVE 07/10/2015 0018   Sepsis Labs: _0 (procalcitonin:4,lacticidven:4) )No results found for this or any previous visit (from the past 240 hour(s)).   Radiological Exams on Admission: Dg Abd Acute W/chest  07/10/2015  CLINICAL DATA:  Increased abdominal pain and vomiting for 1 day, small bowel obstruction, hypertension, diabetes mellitus, breast cancer EXAM: DG ABDOMEN ACUTE W/ 1V CHEST COMPARISON:  Abdominal radiographs 07/01/2015, chest radiograph 01/01/2009 FINDINGS: Normal heart size, mediastinal  contours, and pulmonary vascularity. Minimal central peribronchial thickening. No acute infiltrate, pleural effusion or pneumothorax. Post LEFT breast surgery and axillary node dissection. Dilated small bowel loops with paucity of colonic gas consistent with small bowel obstruction. No bowel wall thickening or free intraperitoneal air. Surgical clips in pelvis bilaterally and in the RIGHT paraspinal region as well as anterior to L2 and L3. Bones demineralized with scattered degenerative changes of the spine. IMPRESSION: Small bowel obstruction. Electronically Signed   By: Lavonia Dana M.D.   On: 07/10/2015 01:18    EKG: Independently reviewed.  Assessment/Plan Principal Problem:   SBO (small bowel obstruction) (HCC) Active Problems:   Breast cancer of lower-outer quadrant of left female breast (HCC)   Diabetes mellitus with complication (HCC)   CKD (chronic kidney disease) stage 3, GFR 30-59 ml/min    SBO - 3rd time in 3 weeks  Dr. Georgette Dover at bedside, plan per him is that patient will need to go to OR as medical therapy is not producing a sustained response or relief  IVF  NGT  NPO  DM2 - sensitive scale SSI Q4H  CKD stage 3 - chronic and stable   DVT prophylaxis: SCDs only due to likely OR Code Status: Full Family Communication: Family at bedside Consults called: [facility] Admission status: Admit to inpatient   [place] Pager [phone] from 7PM-7AM  If 7AM-7PM, please contact the day physician for the patient [email or web address] Password [email or web address]  07/10/2015, 2:14 AM

## 2015-07-10 NOTE — Anesthesia Procedure Notes (Signed)
Procedure Name: Intubation Date/Time: 07/10/2015 11:36 AM Performed by: [place] Pink Pre-anesthesia Checklist: Patient identified, Emergency Drugs available, Suction available and Patient being monitored Patient Re-evaluated:Patient Re-evaluated prior to inductionOxygen Delivery Method: Circle System Utilized Preoxygenation: Pre-oxygenation with 100% oxygen Intubation Type: IV induction and Rapid sequence Laryngoscope Size: Glidescope Grade View: Grade II Tube type: Oral Number of attempts: 1 Airway Equipment and Method: Stylet and Oral airway Placement Confirmation: ETT inserted through vocal cords under direct vision,  positive ETCO2 and breath sounds checked- equal and bilateral Secured at: 23 cm Tube secured with: Tape Dental Injury: Teeth and Oropharynx as per pre-operative assessment  Comments: Glidescope used to intubate, grade one view, ett passes easy thru cords, positive eTCO2, =BBS.

## 2015-07-10 NOTE — Progress Notes (Signed)
Returned to 6n31 from PACU, family at bedside, encouraged to use PCA as needed.

## 2015-07-10 NOTE — ED Notes (Signed)
Patient presents stating she has been seen for a bowel obstruction 2 times and told if you continued to have trouble she would need surgery  Her with increased abd pain

## 2015-07-10 NOTE — Progress Notes (Signed)
Patient ID: Monica Morrison, female   DOB: 08-21-1945, 70 y.o.   MRN: [id number] Recurrent SBO. For ex lap, possible bowel resection. Procedure, risks, and benefits discussed and she agrees. I also told her there is a chance this is caused by a malignancy. I answered her questions and discussed the expected post-op course. Georganna Skeans, MD, MPH, FACS Trauma: [phone] General Surgery: [phone]

## 2015-07-10 NOTE — Consult Note (Signed)
Reason for Consult:  Recurrent SBO Referring Physician: Alcario Drought - [facility]  Monica Morrison is an 70 y.o. female.  HPI: This is a 70 yo female with a PMH of left breast cancer s/p partial mastectomy/ L ALND by Dr. Marylene Buerger in 2006 and endometrial cancer s/p TAH/ BSO in 2002 by Dr. Fermin Schwab presents with symptoms of recurrent SBO.  She has been hospitalized twice in the last few weeks for the same problem.  Each time, her bowel function returned after a couple of days of NG suction.  Her last BM was yesterday morning, but in the afternoon, she became distended with nausea and vomiting.  She has vomited at least twice.  Plain films are consistent with SBO.  Last CT scan on 06/29/15 showed a transition in the anterior lower abdomen.  She is being admitted by [facility].  Past Medical History  Diagnosis Date  . Cancer (HCC)     breast  . Hypertension   . Diabetes mellitus   . Arthritis   . Breast cancer ([place])   . CKD (chronic kidney disease)   . Hypothyroidism 06/22/2015    Past Surgical History  Procedure Laterality Date  . Abdominal hysterectomy    . Breast surgery      Family History  Problem Relation Age of Onset  . Diabetes Mother   . Hypertension Mother   . Diabetes Sister   . Hypertension Sister     Social History:  reports that she has never smoked. She does not have any smokeless tobacco history on file. She reports that she does not drink alcohol or use illicit drugs.  Allergies: No Known Allergies  Medications:  Prior to Admission medications   Medication Sig Start Date End Date Taking? Authorizing Provider  alendronate (FOSAMAX) 70 MG tablet Take 70 mg by mouth every 7 (seven) days. On Sunday   Yes Historical Provider, MD  ALPRAZolam Duanne Moron) 0.25 MG tablet Take 0.25 mg by mouth at bedtime as needed.   Yes Historical Provider, MD  atorvastatin (LIPITOR) 20 MG tablet Take 20 mg by mouth daily.   Yes Historical Provider, MD  bismuth subsalicylate (PEPTO BISMOL) 262 MG/15ML  suspension USE AS DIRECTED FOR LOOSE STOOLS. Patient taking differently: Take 30 mLs by mouth every 4 (four) hours as needed for diarrhea or loose stools.  06/22/15  Yes Earnstine Regal, PA-C  Calcium Carbonate (CALCIUM 500 PO) Take 500 mg by mouth daily.    Yes Historical Provider, MD  escitalopram (LEXAPRO) 10 MG tablet Take 10 mg by mouth daily.  01/06/15  Yes Historical Provider, MD  glipiZIDE (GLUCOTROL) 5 MG tablet Take 10 mg by mouth daily.    Yes Historical Provider, MD  levothyroxine (SYNTHROID, LEVOTHROID) 112 MCG tablet Take 112 mcg by mouth daily.     Yes Historical Provider, MD  metoprolol tartrate (LOPRESSOR) 25 MG tablet Take 1 tablet (25 mg total) by mouth 2 (two) times daily. 06/22/15  Yes Earnstine Regal, PA-C  Multiple Vitamin (MULTIVITAMIN WITH MINERALS) TABS tablet Take 1 tablet by mouth daily.   Yes Historical Provider, MD  omeprazole (PRILOSEC) 20 MG capsule Take 20 mg by mouth daily.  01/21/14  Yes Historical Provider, MD  pioglitazone (ACTOS) 15 MG tablet Take 15 mg by mouth daily.     Yes Historical Provider, MD  saccharomyces boulardii (FLORASTOR) 250 MG capsule YOU CAN BUY THIS AT ANY DRUG STORE, AND FOLLOW DIRECTIONS ON PACKAGE.  USE FOR THE NEXT 2-3 WEEKS TILL YOUR STOOLS ARE NORMAL. Patient taking  differently: Take 250 mg by mouth 2 (two) times daily.  06/22/15  Yes Earnstine Regal, PA-C  ONE TOUCH ULTRA TEST test strip  01/03/13   Historical Provider, MD     Results for orders placed or performed during the hospital encounter of 07/10/15 (from the past 48 hour(s))  Lipase, blood     Status: None   Collection Time: 07/10/15 12:18 AM  Result Value Ref Range   Lipase 30 11 - 51 U/L  Comprehensive metabolic panel     Status: Abnormal   Collection Time: 07/10/15 12:18 AM  Result Value Ref Range   Sodium 141 135 - 145 mmol/L   Potassium 2.9 (L) 3.5 - 5.1 mmol/L   Chloride 104 101 - 111 mmol/L   CO2 23 22 - 32 mmol/L   Glucose, Bld 203 (H) 65 - 99 mg/dL   BUN 14 6 -  20 mg/dL   Creatinine, Ser 1.36 (H) 0.44 - 1.00 mg/dL   Calcium 9.4 8.9 - 10.3 mg/dL   Total Protein 6.8 6.5 - 8.1 g/dL   Albumin 2.9 (L) 3.5 - 5.0 g/dL   AST 19 15 - 41 U/L   ALT 48 14 - 54 U/L   Alkaline Phosphatase 252 (H) 38 - 126 U/L   Total Bilirubin 0.6 0.3 - 1.2 mg/dL   GFR calc non Af Amer 38 (L) >60 mL/min   GFR calc Af Amer 45 (L) >60 mL/min    Comment: (NOTE) The eGFR has been calculated using the CKD EPI equation. This calculation has not been validated in all clinical situations. eGFR's persistently <60 mL/min signify possible Chronic Kidney Disease.    Anion gap 14 5 - 15  CBC     Status: Abnormal   Collection Time: 07/10/15 12:18 AM  Result Value Ref Range   WBC 9.0 4.0 - 10.5 K/uL   RBC 4.33 3.87 - 5.11 MIL/uL   Hemoglobin 11.1 (L) 12.0 - 15.0 g/dL   HCT 37.0 36.0 - 46.0 %   MCV 85.5 78.0 - 100.0 fL   MCH 25.6 (L) 26.0 - 34.0 pg   MCHC 30.0 30.0 - 36.0 g/dL   RDW 17.5 (H) 11.5 - 15.5 %   Platelets 346 150 - 400 K/uL  Urinalysis, Routine w reflex microscopic     Status: Abnormal   Collection Time: 07/10/15 12:18 AM  Result Value Ref Range   Color, Urine YELLOW YELLOW   APPearance CLEAR CLEAR   Specific Gravity, Urine 1.019 1.005 - 1.030   pH 5.5 5.0 - 8.0   Glucose, UA NEGATIVE NEGATIVE mg/dL   Hgb urine dipstick NEGATIVE NEGATIVE   Bilirubin Urine NEGATIVE NEGATIVE   Ketones, ur 15 (A) NEGATIVE mg/dL   Protein, ur NEGATIVE NEGATIVE mg/dL   Nitrite NEGATIVE NEGATIVE   Leukocytes, UA NEGATIVE NEGATIVE    Comment: MICROSCOPIC NOT DONE ON URINES WITH NEGATIVE PROTEIN, BLOOD, LEUKOCYTES, NITRITE, OR GLUCOSE <1000 mg/dL.  Differential     Status: None   Collection Time: 07/10/15 12:18 AM  Result Value Ref Range   Neutrophils Relative % 82 %   Neutro Abs 7.3 1.7 - 7.7 K/uL   Lymphocytes Relative 10 %   Lymphs Abs 0.9 0.7 - 4.0 K/uL   Monocytes Relative 6 %   Monocytes Absolute 0.5 0.1 - 1.0 K/uL   Eosinophils Relative 2 %   Eosinophils Absolute 0.2 0.0  - 0.7 K/uL   Basophils Relative 0 %   Basophils Absolute 0.0 0.0 - 0.1 K/uL  Dg Abd Acute W/chest  07/10/2015  CLINICAL DATA:  Increased abdominal pain and vomiting for 1 day, small bowel obstruction, hypertension, diabetes mellitus, breast cancer EXAM: DG ABDOMEN ACUTE W/ 1V CHEST COMPARISON:  Abdominal radiographs 07/01/2015, chest radiograph 01/01/2009 FINDINGS: Normal heart size, mediastinal contours, and pulmonary vascularity. Minimal central peribronchial thickening. No acute infiltrate, pleural effusion or pneumothorax. Post LEFT breast surgery and axillary node dissection. Dilated small bowel loops with paucity of colonic gas consistent with small bowel obstruction. No bowel wall thickening or free intraperitoneal air. Surgical clips in pelvis bilaterally and in the RIGHT paraspinal region as well as anterior to L2 and L3. Bones demineralized with scattered degenerative changes of the spine. IMPRESSION: Small bowel obstruction. Electronically Signed   By: Lavonia Dana M.D.   On: 07/10/2015 01:18    Review of Systems  Constitutional: Negative for weight loss.  HENT: Negative for ear discharge, ear pain, hearing loss and tinnitus.   Eyes: Negative for blurred vision, double vision, photophobia and pain.  Respiratory: Negative for cough, sputum production and shortness of breath.   Cardiovascular: Negative for chest pain.  Gastrointestinal: Positive for nausea, vomiting and abdominal pain.  Genitourinary: Negative for dysuria, urgency, frequency and flank pain.  Musculoskeletal: Negative for myalgias, back pain, joint pain, falls and neck pain.  Neurological: Negative for dizziness, tingling, sensory change, focal weakness, loss of consciousness and headaches.  Endo/Heme/Allergies: Does not bruise/bleed easily.  Psychiatric/Behavioral: Negative for depression, memory loss and substance abuse. The patient is not nervous/anxious.    Blood pressure 146/93, pulse 81, temperature 98.4 F (36.9  C), temperature source Oral, resp. rate 16, height 5' 2"  (1.575 m), weight 75.751 kg (167 lb), SpO2 100 %. Physical Exam WDWN in NAD HEENT:  EOMI, sclera anicteric Neck:  No masses, no thyromegaly Lungs:  CTA bilaterally; normal respiratory effort CV:  Regular rate and rhythm; no murmurs Abd:  Hypoactive bowel sounds, distended; minimally tender Ext:  Well-perfused; no edema Skin:  Warm, dry; no sign of jaundice  Assessment/Plan: Recurrent SBO History of Left breast cancer/ endometrial cancer Hypokalemia  Recommend: NG suction NPO Will likely need exploratory laparotomy this admission.  Discussed with patient and her family member. Replete K  Kendrah Lovern K. 07/10/2015, 2:16 AM

## 2015-07-10 NOTE — ED Provider Notes (Signed)
CSN: [id number]     Arrival date & time 07/09/15  2357 History   By signing my name below, I, Forrestine Him, attest that this documentation has been prepared under the direction and in the presence of Delora Fuel, MD.  Electronically Signed: Forrestine Him, ED Scribe. 07/10/2015. 12:51 AM.  Chief Complaint  Patient presents with  . Bowel Obstruction    The history is provided by the patient. No language interpreter was used.    HPI Comments: Monica Morrison is a 70 y.o. female with a PMHx of breast cancer, HTN, DM, and CKD who presents to the Emergency Department complaining of intermittent, ongoing nausea and vomiting onset 10:00 PM this evening. She reports 1-2 episodes prior to arrival. Pt also reports intermittent, ongoing belching and upper abdominal pain. No aggravating or alleviating factors at this time. No OTC medications or home remedies attempted prior to arrival. No recent fever, chills, diaphoresis, or cough. Last normal bowel movement early this morning. Pt states she is still able to pass gas. [name] was recently seen in the Emergency Department on 5/8 for similar symptoms. At that time, pt was admitted to the hospital. In addition, 2 weeks prior pt was seen for similar and was again admitted and diagnosed with a bowel obstruction. No known allergies to medications.   PCP: Gara Kroner, MD    Past Medical History  Diagnosis Date  . Cancer (HCC)     breast  . Hypertension   . Diabetes mellitus   . Arthritis   . Breast cancer ([place])   . CKD (chronic kidney disease)   . Hypothyroidism 06/22/2015   Past Surgical History  Procedure Laterality Date  . Abdominal hysterectomy    . Breast surgery     Family History  Problem Relation Age of Onset  . Diabetes Mother   . Hypertension Mother   . Diabetes Sister   . Hypertension Sister    Social History  Substance Use Topics  . Smoking status: Never Smoker   . Smokeless tobacco: None  . Alcohol Use: No   OB History     No data available     Review of Systems  Constitutional: Negative for fever and chills.  Respiratory: Negative for cough.   Cardiovascular: Negative for chest pain.  Gastrointestinal: Positive for nausea, vomiting and abdominal pain.  Neurological: Negative for headaches.  Psychiatric/Behavioral: Negative for confusion.  All other systems reviewed and are negative.     Allergies  Review of patient's allergies indicates no known allergies.  Home Medications   Prior to Admission medications   Medication Sig Start Date End Date Taking? Authorizing Provider  alendronate (FOSAMAX) 70 MG tablet Take 70 mg by mouth every 7 (seven) days. Take with a full glass of water on an empty stomach.     Historical Provider, MD  ALPRAZolam Duanne Moron) 0.25 MG tablet Take 0.25 mg by mouth at bedtime as needed.    Historical Provider, MD  atorvastatin (LIPITOR) 20 MG tablet Take 20 mg by mouth daily.    Historical Provider, MD  bismuth subsalicylate (PEPTO BISMOL) 262 MG/15ML suspension USE AS DIRECTED FOR LOOSE STOOLS. 06/22/15   Earnstine Regal, PA-C  Calcium Carbonate (CALCIUM 500 PO) Take by mouth.      Historical Provider, MD  escitalopram (LEXAPRO) 10 MG tablet  01/06/15   Historical Provider, MD  glipiZIDE (GLUCOTROL) 5 MG tablet Take 10 mg by mouth daily.     Historical Provider, MD  levothyroxine (SYNTHROID, LEVOTHROID) 112 MCG tablet  Take 112 mcg by mouth daily.      Historical Provider, MD  metoprolol tartrate (LOPRESSOR) 25 MG tablet Take 1 tablet (25 mg total) by mouth 2 (two) times daily. 06/22/15   Earnstine Regal, PA-C  Multiple Vitamins-Minerals (MULTIVITAMIN PO) Take by mouth.    Historical Provider, MD  omeprazole (PRILOSEC) 20 MG capsule  01/21/14   Historical Provider, MD  ONE TOUCH ULTRA TEST test strip  01/03/13   Historical Provider, MD  pioglitazone (ACTOS) 15 MG tablet Take 15 mg by mouth daily.      Historical Provider, MD  saccharomyces boulardii (FLORASTOR) 250 MG capsule YOU CAN  BUY THIS AT ANY DRUG STORE, AND FOLLOW DIRECTIONS ON PACKAGE.  USE FOR THE NEXT 2-3 WEEKS TILL YOUR STOOLS ARE NORMAL. 06/22/15   Earnstine Regal, PA-C   Triage Vitals: BP 185/99 mmHg  Pulse 89  Temp(Src) 98.4 F (36.9 C) (Oral)  Resp 18  Ht 5\' 2"  (1.575 m)  Wt 167 lb (75.751 kg)  BMI 30.54 kg/m2  SpO2 100%   Physical Exam  Constitutional: She is oriented to person, place, and time. She appears well-developed and well-nourished. No distress.  HENT:  Head: Normocephalic and atraumatic.  Eyes: EOM are normal. Pupils are equal, round, and reactive to light.  Neck: Normal range of motion. Neck supple. No JVD present.  Cardiovascular: Normal rate, regular rhythm and normal heart sounds.   No murmur heard. Pulmonary/Chest: Effort normal and breath sounds normal. She has no wheezes. She has no rales. She exhibits no tenderness.  Abdominal: Soft. She exhibits distension. She exhibits no mass. There is tenderness. There is no rebound and no guarding.  Mildly distended Mild epigastric tenderness noted Bowel sounds are hyperactive   Musculoskeletal: Normal range of motion. She exhibits no edema.  Lymphadenopathy:    She has no cervical adenopathy.  Neurological: She is alert and oriented to person, place, and time. No cranial nerve deficit. She exhibits normal muscle tone. Coordination normal.  Skin: Skin is warm and dry. No rash noted.  Psychiatric: She has a normal mood and affect. Her behavior is normal. Judgment and thought content normal.  Nursing note and vitals reviewed.   ED Course  Procedures (including critical care time)  DIAGNOSTIC STUDIES: Oxygen Saturation is 99% on RA, Normal by my interpretation.    COORDINATION OF CARE: 12:47 AM- Will order blood work and urinalysis. Will give Zofran and Morphine. Discussed treatment plan with pt at bedside and pt agreed to plan.     Labs Review Results for orders placed or performed during the hospital encounter of 07/10/15  Lipase,  blood  Result Value Ref Range   Lipase 30 11 - 51 U/L  Comprehensive metabolic panel  Result Value Ref Range   Sodium 141 135 - 145 mmol/L   Potassium 2.9 (L) 3.5 - 5.1 mmol/L   Chloride 104 101 - 111 mmol/L   CO2 23 22 - 32 mmol/L   Glucose, Bld 203 (H) 65 - 99 mg/dL   BUN 14 6 - 20 mg/dL   Creatinine, Ser 1.36 (H) 0.44 - 1.00 mg/dL   Calcium 9.4 8.9 - 10.3 mg/dL   Total Protein 6.8 6.5 - 8.1 g/dL   Albumin 2.9 (L) 3.5 - 5.0 g/dL   AST 19 15 - 41 U/L   ALT 48 14 - 54 U/L   Alkaline Phosphatase 252 (H) 38 - 126 U/L   Total Bilirubin 0.6 0.3 - 1.2 mg/dL   GFR calc non Af Wyvonnia Lora  38 (L) >60 mL/min   GFR calc Af Amer 45 (L) >60 mL/min   Anion gap 14 5 - 15  CBC  Result Value Ref Range   WBC 9.0 4.0 - 10.5 K/uL   RBC 4.33 3.87 - 5.11 MIL/uL   Hemoglobin 11.1 (L) 12.0 - 15.0 g/dL   HCT 37.0 36.0 - 46.0 %   MCV 85.5 78.0 - 100.0 fL   MCH 25.6 (L) 26.0 - 34.0 pg   MCHC 30.0 30.0 - 36.0 g/dL   RDW 17.5 (H) 11.5 - 15.5 %   Platelets 346 150 - 400 K/uL  Urinalysis, Routine w reflex microscopic  Result Value Ref Range   Color, Urine YELLOW YELLOW   APPearance CLEAR CLEAR   Specific Gravity, Urine 1.019 1.005 - 1.030   pH 5.5 5.0 - 8.0   Glucose, UA NEGATIVE NEGATIVE mg/dL   Hgb urine dipstick NEGATIVE NEGATIVE   Bilirubin Urine NEGATIVE NEGATIVE   Ketones, ur 15 (A) NEGATIVE mg/dL   Protein, ur NEGATIVE NEGATIVE mg/dL   Nitrite NEGATIVE NEGATIVE   Leukocytes, UA NEGATIVE NEGATIVE  Differential  Result Value Ref Range   Neutrophils Relative % 82 %   Neutro Abs 7.3 1.7 - 7.7 K/uL   Lymphocytes Relative 10 %   Lymphs Abs 0.9 0.7 - 4.0 K/uL   Monocytes Relative 6 %   Monocytes Absolute 0.5 0.1 - 1.0 K/uL   Eosinophils Relative 2 %   Eosinophils Absolute 0.2 0.0 - 0.7 K/uL   Basophils Relative 0 %   Basophils Absolute 0.0 0.0 - 0.1 K/uL   Imaging Review Dg Abd Acute W/chest  07/10/2015  CLINICAL DATA:  Increased abdominal pain and vomiting for 1 day, small bowel obstruction,  hypertension, diabetes mellitus, breast cancer EXAM: DG ABDOMEN ACUTE W/ 1V CHEST COMPARISON:  Abdominal radiographs 07/01/2015, chest radiograph 01/01/2009 FINDINGS: Normal heart size, mediastinal contours, and pulmonary vascularity. Minimal central peribronchial thickening. No acute infiltrate, pleural effusion or pneumothorax. Post LEFT breast surgery and axillary node dissection. Dilated small bowel loops with paucity of colonic gas consistent with small bowel obstruction. No bowel wall thickening or free intraperitoneal air. Surgical clips in pelvis bilaterally and in the RIGHT paraspinal region as well as anterior to L2 and L3. Bones demineralized with scattered degenerative changes of the spine. IMPRESSION: Small bowel obstruction. Electronically Signed   By: Lavonia Dana M.D.   On: 07/10/2015 01:18   I have personally reviewed and evaluated these images and lab results as part of my medical decision-making.   MDM   Final diagnoses:  Small bowel obstruction (HCC)  Renal insufficiency  Hypokalemia  Normochromic normocytic anemia   Recurrent nausea and vomiting in patient has 2 recent hospitalizations for bowel obstructions. She is given ondansetron for nausea and started on IV fluids. Abdominal x-rays confirm recurrence of small bowel obstruction. Review of old records shows a hospitalization for small bowel obstruction April 24 through May 1, and May 8 through May 13. Case is discussed with Dr. Alcario Drought of [facility] agrees to admit the patient. Also, because of second recurrence in less than a month, general surgery has been consult today. Case has been discussed with Dr. Gershon Crane who agrees to see the patient in consultation. Also, prior CT scan had shown probable liver metastasis versus liver primary cancer and MRI of the liver had been recommended. This could be done while in the hospital. However, if she does proceed to operative management of recurrent obstruction, opportunity would be there  to  do biopsy of the lesion.  I personally performed the services described in this documentation, which was scribed in my presence. The recorded information has been reviewed and is accurate.      Delora Fuel, MD [phone]

## 2015-07-10 NOTE — Anesthesia Preprocedure Evaluation (Addendum)
Anesthesia Evaluation  Patient identified by MRN, date of birth, ID band Patient awake  General Assessment Comment:PMH of left breast cancer s/p partial mastectomy/ L ALND in 2006 and endometrial cancer s/p TAH/ BSO in 2012  Reviewed: Allergy & Precautions, NPO status , Patient's Chart, lab work & pertinent test results, reviewed documented beta blocker date and time   Airway Mallampati: II  TM Distance: >3 FB Neck ROM: Full    Dental  (+) Teeth Intact, Dental Advisory Given   Pulmonary neg pulmonary ROS,    Pulmonary exam normal breath sounds clear to auscultation       Cardiovascular hypertension, Pt. on medications and Pt. on home beta blockers Normal cardiovascular exam Rhythm:Regular Rate:Normal     Neuro/Psych PSYCHIATRIC DISORDERS Depression negative neurological ROS     GI/Hepatic Neg liver ROS, GERD  Medicated,Recurrent SBO   Endo/Other  diabetes, Type 2, Oral Hypoglycemic AgentsHypothyroidism obesity  Renal/GU Renal InsufficiencyRenal disease     Musculoskeletal  (+) Arthritis , Osteoarthritis,    Abdominal   Peds  Hematology  (+) Blood dyscrasia, anemia ,   Anesthesia Other Findings Day of surgery medications reviewed with the patient.  Reproductive/Obstetrics                        Anesthesia Physical Anesthesia Plan  ASA: III  Anesthesia Plan: General   Post-op Pain Management:    Induction: Intravenous, Rapid sequence and Cricoid pressure planned  Airway Management Planned: Oral ETT  Additional Equipment:   Intra-op Plan:   Post-operative Plan: Possible Post-op intubation/ventilation  Informed Consent: I have reviewed the patients History and Physical, chart, labs and discussed the procedure including the risks, benefits and alternatives for the proposed anesthesia with the patient or authorized representative who has indicated his/her understanding and acceptance.    Dental advisory given  Plan Discussed with: CRNA  Anesthesia Plan Comments: (Risks/benefits of general anesthesia discussed with patient including risk of damage to teeth, lips, gum, and tongue, nausea/vomiting, allergic reactions to medications, and the possibility of heart attack, stroke and death.  All patient questions answered.  Patient wishes to proceed.)       Anesthesia Quick Evaluation

## 2015-07-11 LAB — MAGNESIUM: Magnesium: 1.4 mg/dL — ABNORMAL LOW (ref 1.7–2.4)

## 2015-07-11 LAB — BASIC METABOLIC PANEL
Anion gap: 16 — ABNORMAL HIGH (ref 5–15)
BUN: 19 mg/dL (ref 6–20)
CALCIUM: 8.4 mg/dL — AB (ref 8.9–10.3)
CHLORIDE: 108 mmol/L (ref 101–111)
CO2: 19 mmol/L — AB (ref 22–32)
CREATININE: 1.35 mg/dL — AB (ref 0.44–1.00)
GFR calc non Af Amer: 39 mL/min — ABNORMAL LOW (ref >60)
GFR, EST AFRICAN AMERICAN: 45 mL/min — AB (ref >60)
Glucose, Bld: 122 mg/dL — ABNORMAL HIGH (ref 65–99)
Potassium: 4.6 mmol/L (ref 3.5–5.1)
SODIUM: 143 mmol/L (ref 135–145)

## 2015-07-11 LAB — GLUCOSE, CAPILLARY
GLUCOSE-CAPILLARY: 130 mg/dL — AB (ref 65–99)
GLUCOSE-CAPILLARY: 139 mg/dL — AB (ref 65–99)
GLUCOSE-CAPILLARY: 146 mg/dL — AB (ref 65–99)
GLUCOSE-CAPILLARY: 153 mg/dL — AB (ref 65–99)
Glucose-Capillary: 155 mg/dL — ABNORMAL HIGH (ref 65–99)
Glucose-Capillary: 116 mg/dL — ABNORMAL HIGH (ref 65–99)

## 2015-07-11 LAB — CBC
HEMATOCRIT: 36.6 % (ref 36.0–46.0)
Hemoglobin: 10.8 g/dL — ABNORMAL LOW (ref 12.0–15.0)
MCH: 25.2 pg — AB (ref 26.0–34.0)
MCHC: 29.5 g/dL — ABNORMAL LOW (ref 30.0–36.0)
MCV: 85.5 fL (ref 78.0–100.0)
PLATELETS: 249 10*3/uL (ref 150–400)
RBC: 4.28 MIL/uL (ref 3.87–5.11)
RDW: 17.9 % — AB (ref 11.5–15.5)
WBC: 12.3 10*3/uL — AB (ref 4.0–10.5)

## 2015-07-11 MED ORDER — SODIUM CHLORIDE 0.9 % IV BOLUS (SEPSIS)
1000.0000 mL | Freq: Once | INTRAVENOUS | Status: AC
  Administered 2015-07-11: 1000 mL via INTRAVENOUS

## 2015-07-11 MED ORDER — CETYLPYRIDINIUM CHLORIDE 0.05 % MT LIQD
7.0000 mL | Freq: Two times a day (BID) | OROMUCOSAL | Status: DC
  Administered 2015-07-11 – 2015-07-15 (×6): 7 mL via OROMUCOSAL

## 2015-07-11 NOTE — Progress Notes (Signed)
1 Day Post-Op  Subjective: Complains of tenderness. Doesn't remember surgery  Objective: Vital signs in last 24 hours: Temp:  [97.5 F (36.4 C)-98.6 F (37 C)] 98.6 F (37 C) (05/20 0425) Pulse Rate:  [71-103] 99 (05/20 0425) Resp:  [12-21] 20 (05/20 0800) BP: (98-174)/(51-108) 142/66 mmHg (05/20 0425) SpO2:  [91 %-100 %] 98 % (05/20 0425) Last BM Date: 07/09/15  Intake/Output from previous day: 05/19 0701 - 05/20 0700 In: 2376 [I.V.:2376] Out: 605 [Urine:525; Emesis/NG output:50; Blood:30] Intake/Output this shift:    Resp: clear to auscultation bilaterally Cardio: regular rate and rhythm GI: soft, appropriately tender. incision ok  Lab Results:   Recent Labs  07/10/15 0541 07/11/15 0447  WBC 7.6 12.3*  HGB 9.8* 10.8*  HCT 33.0* 36.6  PLT 285 249   BMET  Recent Labs  07/10/15 0541 07/11/15 0447  NA 144 143  K 2.7* 4.6  CL 106 108  CO2 26 19*  GLUCOSE 190* 122*  BUN 14 19  CREATININE 1.20* 1.35*  CALCIUM 8.9 8.4*   PT/INR No results for input(s): LABPROT, INR in the last 72 hours. ABG No results for input(s): PHART, HCO3 in the last 72 hours.  Invalid input(s): PCO2, PO2  Studies/Results: Dg Abd Acute W/chest  07/10/2015  CLINICAL DATA:  Increased abdominal pain and vomiting for 1 day, small bowel obstruction, hypertension, diabetes mellitus, breast cancer EXAM: DG ABDOMEN ACUTE W/ 1V CHEST COMPARISON:  Abdominal radiographs 07/01/2015, chest radiograph 01/01/2009 FINDINGS: Normal heart size, mediastinal contours, and pulmonary vascularity. Minimal central peribronchial thickening. No acute infiltrate, pleural effusion or pneumothorax. Post LEFT breast surgery and axillary node dissection. Dilated small bowel loops with paucity of colonic gas consistent with small bowel obstruction. No bowel wall thickening or free intraperitoneal air. Surgical clips in pelvis bilaterally and in the RIGHT paraspinal region as well as anterior to L2 and L3. Bones  demineralized with scattered degenerative changes of the spine. IMPRESSION: Small bowel obstruction. Electronically Signed   By: Lavonia Dana M.D.   On: 07/10/2015 01:18    Anti-infectives: Anti-infectives    None      Assessment/Plan: s/p Procedure(s): EXPLORATORY LAPAROTOMY  (N/A) SMALL BOWEL RESECTION (N/A) Continue ng and bowel rest  OOB to chair today  LOS: 1 day    TOTH III,Camyla Camposano S 07/11/2015

## 2015-07-11 NOTE — Progress Notes (Signed)
[facility] Progress Note  Patient: Monica Morrison [id number]   PCP: Gara Kroner, MD DOB: 1945-06-11   DOA: 07/10/2015   DOS: 07/11/2015   Date of Service: the patient was seen and examined on 07/11/2015  Subjective: The patient mentions the pain has been well controlled while she is using the PCA. She denies any nausea or vomiting. Nutrition: npo  Brief hospital course: Patient was admitted on 07/10/2015, with complaint of nausea vomiting and abdominal pain with recurrent small bowel obstruction. Underwent expiratory laparoscopic and small bowel resection Currently further plan is to the postoperative recovery.  Assessment and Plan: 1. SBO (small bowel obstruction) (HCC) Status post exploratory laparotomy and small bowel resection. 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 Specimen has been sent to biopsy. Currently on PCA pump as well as NG tube. Management per surgery.  2.  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 Georganna Skeans if liver biopsy feasible during the surgery. Per Dr. Grandville Silos, it may be difficult and deep and may need to be done later. - Discussed with patient's family, she will need appointment with her oncologist, Dr. Lindi Adie  3. Chronic kidney disease. Currently stable. We will monitor.  4. Diabetes mellitus type 2. Currently on sensitive sliding scale.  Pain management: PCA pump Activity: Pending physical therapy Bowel regimen: last BM prior to arrival Diet: Nothing by mouth DVT Prophylaxis: subcutaneous Heparin  Advance goals of care discussion: Full code  Family Communication: no family was present at bedside, at the time of interview.   Disposition:  Discharge to SNF likely pending PTOT,  Expected discharge date: 07/15/2015 pending advancement of diet  Consultants: gen Surgery Procedures: Status post expiratory laparotomy and small bowel  resection  Antibiotics: Anti-infectives    None        Intake/Output Summary (Last 24 hours) at 07/11/15 1851 Last data filed at 07/11/15 1830  Gross per 24 hour  Intake   1801 ml  Output    615 ml  Net   1186 ml   Filed Weights   07/10/15 0003  Weight: 75.751 kg (167 lb)    Objective: Physical Exam: Filed Vitals:   07/11/15 1131 07/11/15 1418 07/11/15 1513 07/11/15 1816  BP:  111/88  156/82  Pulse:  88  93  Temp:  98.3 F (36.8 C)  98 F (36.7 C)  TempSrc:  Oral  Oral  Resp: 18 20 17 23   Height:      Weight:      SpO2: 98% 99% 96% 99%    General: Alert, Awake and Oriented to Time, Place and Person. Appear in mild distress Eyes: PERRL, Conjunctiva normal ENT: Oral Mucosa clear moist. Neck: no JVD, no Abnormal Mass Or lumps Cardiovascular: S1 and S2 Present, aortic systolic  Murmur, Peripheral Pulses Present Respiratory: Bilateral Air entry equal and Decreased, Clear to Auscultation, no Crackles, no wheezes Abdomen: Bowel Sound absent, Soft and mild tenderness Skin: no redness, no Rash  Extremities: no Pedal edema, no calf tenderness Neurologic: Grossly no focal neuro deficit. Bilaterally Equal motor strength  Data Reviewed: CBC:  Recent Labs Lab 07/10/15 0018 07/10/15 0541 07/11/15 0447  WBC 9.0 7.6 12.3*  NEUTROABS 7.3  --   --   HGB 11.1* 9.8* 10.8*  HCT 37.0 33.0* 36.6  MCV 85.5 84.0 85.5  PLT 346 285 0000000   Basic Metabolic Panel:  Recent Labs Lab 07/10/15 0018 07/10/15 0541 07/11/15 0447  NA  141 144 143  K 2.9* 2.7* 4.6  CL 104 106 108  CO2 23 26 19*  GLUCOSE 203* 190* 122*  BUN 14 14 19   CREATININE 1.36* 1.20* 1.35*  CALCIUM 9.4 8.9 8.4*  MG  --  1.5* 1.4*    Liver Function Tests:  Recent Labs Lab 07/10/15 0018  AST 19  ALT 48  ALKPHOS 252*  BILITOT 0.6  PROT 6.8  ALBUMIN 2.9*    Recent Labs Lab 07/10/15 0018  LIPASE 30   No results for input(s): AMMONIA in the last 168 hours. Coagulation Profile: No results for  input(s): INR, PROTIME in the last 168 hours. Cardiac Enzymes: No results for input(s): CKTOTAL, CKMB, CKMBINDEX, TROPONINI in the last 168 hours. BNP (last 3 results) No results for input(s): PROBNP in the last 8760 hours.  CBG:  Recent Labs Lab 07/11/15 0009 07/11/15 0419 07/11/15 0837 07/11/15 1253 07/11/15 1625  GLUCAP 153* 139* 146* 155* 116*    Studies: No results found.   Scheduled Meds: . antiseptic oral rinse  7 mL Mouth Rinse BID  . enoxaparin (LOVENOX) injection  40 mg Subcutaneous Q24H  . escitalopram  10 mg Oral Daily  . insulin aspart  0-15 Units Subcutaneous Q4H  . levothyroxine  112 mcg Oral QAC breakfast  . metoprolol tartrate  25 mg Oral BID  . morphine   Intravenous Q4H  . pantoprazole  40 mg Oral Daily  . sodium chloride  1,000 mL Intravenous Once   Continuous Infusions: . lactated ringers 10 mL/hr at 07/10/15 1050  . sodium chloride 0.45 % with kcl 75 mL/hr at 07/11/15 1259   PRN Meds: ALPRAZolam, diphenhydrAMINE **OR** diphenhydrAMINE, methocarbamol (ROBAXIN)  IV, naloxone **AND** sodium chloride flush, ondansetron (ZOFRAN) IV  Time spent: 30 minutes  Author: Berle Mull, MD [facility] Pager: [phone] 07/11/2015 6:51 PM  If 7PM-7AM, please contact night-coverage at [email or web address], password [facility]

## 2015-07-11 NOTE — Progress Notes (Signed)
Decreased output last 8 hrs. (115 cc). Dr. Kieth Brightly texted.

## 2015-07-12 LAB — GLUCOSE, CAPILLARY
GLUCOSE-CAPILLARY: 112 mg/dL — AB (ref 65–99)
GLUCOSE-CAPILLARY: 118 mg/dL — AB (ref 65–99)
GLUCOSE-CAPILLARY: 122 mg/dL — AB (ref 65–99)
Glucose-Capillary: 115 mg/dL — ABNORMAL HIGH (ref 65–99)
Glucose-Capillary: 114 mg/dL — ABNORMAL HIGH (ref 65–99)
Glucose-Capillary: 123 mg/dL — ABNORMAL HIGH (ref 65–99)

## 2015-07-12 LAB — COMPREHENSIVE METABOLIC PANEL
ALT: 17 U/L (ref 14–54)
ANION GAP: 10 (ref 5–15)
AST: 20 U/L (ref 15–41)
Albumin: 2 g/dL — ABNORMAL LOW (ref 3.5–5.0)
Alkaline Phosphatase: 152 U/L — ABNORMAL HIGH (ref 38–126)
BUN: 18 mg/dL (ref 6–20)
CHLORIDE: 106 mmol/L (ref 101–111)
CO2: 24 mmol/L (ref 22–32)
CREATININE: 1.27 mg/dL — AB (ref 0.44–1.00)
Calcium: 7.9 mg/dL — ABNORMAL LOW (ref 8.9–10.3)
GFR, EST AFRICAN AMERICAN: 48 mL/min — AB (ref >60)
GFR, EST NON AFRICAN AMERICAN: 42 mL/min — AB (ref >60)
Glucose, Bld: 117 mg/dL — ABNORMAL HIGH (ref 65–99)
Potassium: 4.3 mmol/L (ref 3.5–5.1)
SODIUM: 140 mmol/L (ref 135–145)
Total Bilirubin: 0.5 mg/dL (ref 0.3–1.2)
Total Protein: 5.4 g/dL — ABNORMAL LOW (ref 6.5–8.1)

## 2015-07-12 LAB — CBC WITH DIFFERENTIAL/PLATELET
BASOS PCT: 0 %
Basophils Absolute: 0 10*3/uL (ref 0.0–0.1)
EOS ABS: 0.2 10*3/uL (ref 0.0–0.7)
Eosinophils Relative: 2 %
HEMATOCRIT: 32.7 % — AB (ref 36.0–46.0)
HEMOGLOBIN: 9.7 g/dL — AB (ref 12.0–15.0)
LYMPHS ABS: 0.5 10*3/uL — AB (ref 0.7–4.0)
Lymphocytes Relative: 4 %
MCH: 25.4 pg — AB (ref 26.0–34.0)
MCHC: 29.7 g/dL — AB (ref 30.0–36.0)
MCV: 85.6 fL (ref 78.0–100.0)
MONOS PCT: 6 %
Monocytes Absolute: 0.8 10*3/uL (ref 0.1–1.0)
NEUTROS ABS: 11.9 10*3/uL — AB (ref 1.7–7.7)
NEUTROS PCT: 88 %
Platelets: 265 10*3/uL (ref 150–400)
RBC: 3.82 MIL/uL — AB (ref 3.87–5.11)
RDW: 18.1 % — ABNORMAL HIGH (ref 11.5–15.5)
WBC: 13.4 10*3/uL — AB (ref 4.0–10.5)

## 2015-07-12 LAB — MAGNESIUM: MAGNESIUM: 1.4 mg/dL — AB (ref 1.7–2.4)

## 2015-07-12 MED ORDER — MAGNESIUM SULFATE 2 GM/50ML IV SOLN
2.0000 g | Freq: Once | INTRAVENOUS | Status: AC
  Administered 2015-07-12: 2 g via INTRAVENOUS
  Filled 2015-07-12: qty 50

## 2015-07-12 NOTE — Progress Notes (Signed)
[facility] Progress Note  Patient: Monica Morrison [id number]   PCP: Gara Kroner, MD DOB: 1945-07-08   DOA: 07/10/2015   DOS: 07/12/2015   Date of Service: the patient was seen and examined on 07/12/2015  Subjective: No events like nausea vomiting reported overnight. Patient denies any complaints of chest pain. Mentions her pain is well controlled on PCA. Nutrition: npo  Brief hospital course: Patient was admitted on 07/10/2015, with complaint of nausea vomiting and abdominal pain with recurrent small bowel obstruction. Underwent expiratory laparoscopic and small bowel resection on 07/10/2015. Patient has been on PCA and has remained nothing by mouth since then. Surgery team is guiding the treatment. Currently further plan is to the postoperative recovery.  Assessment and Plan: 1. SBO (small bowel obstruction) (HCC) Status post exploratory laparotomy and small bowel resection. She was found to have suspected metastatic adenocarcinoma intraoperatively likely causing her obstruction. Pathology report is still pending Currently on PCA pump as well as NG tube. Management per surgery.  2.  History of breast CA with possible liver metastasis seen on CT on 5/8 - Called Dr. Georganna Skeans if liver biopsy feasible during the surgery. Per Dr. Grandville Silos, it may be difficult and deep and may need to be done later. - Discussed with patient's family, she will need appointment with her oncologist, Dr. Lindi Adie - If the small bowel biopsy turns out positive for carcinoma from metastasis, may not need liver biopsy.  3. Chronic kidney disease. Currently stable. We will monitor.  4. Diabetes mellitus type 2. Currently on sensitive sliding scale.  5. Hypomagnesemia. Replacing.  Pain management: PCA pump Activity: Pending physical therapy Bowel regimen: last BM prior to arrival Diet: Nothing by mouth DVT Prophylaxis: subcutaneous Heparin  Advance goals of care discussion: Full  code  Family Communication: no family was present at bedside, at the time of interview.   Disposition:  Discharge to SNF likely pending PTOT,  Expected discharge date: 07/16/2015 pending advancement of diet  Consultants: gen Surgery Procedures: Status post expiratory laparotomy and small bowel resection  Antibiotics: Anti-infectives    None        Intake/Output Summary (Last 24 hours) at 07/12/15 1613 Last data filed at 07/12/15 1216  Gross per 24 hour  Intake   1866 ml  Output   1000 ml  Net    866 ml   Filed Weights   07/10/15 0003  Weight: 75.751 kg (167 lb)    Objective: Physical Exam: Filed Vitals:   07/12/15 0800 07/12/15 1021 07/12/15 1200 07/12/15 1357  BP:  150/72  118/60  Pulse:  86  75  Temp:  98.7 F (37.1 C)  98.8 F (37.1 C)  TempSrc:  Oral  Oral  Resp: 27 24 25 24   Height:      Weight:      SpO2: 100% 96% 98% 98%   General: Alert, Awake and Oriented to Time, Place and Person. Appear in mild distress Eyes: PERRL, Conjunctiva normal ENT: Oral Mucosa clear moist. Cardiovascular: S1 and S2 Present, aortic systolic  Murmur,  Respiratory: Bilateral Air entry equal and Decreased, Clear to Auscultation, no Crackles, no wheezes Abdomen: Bowel Sound Sluggish and present, Soft and mild tenderness Extremities: no Pedal edema, no calf tenderness Neurologic: Grossly no focal neuro deficit. Bilaterally Equal motor strength  Data Reviewed: CBC:  Recent Labs Lab 07/10/15 0018 07/10/15 0541 07/11/15 0447 07/12/15 0535  WBC 9.0 7.6 12.3* 13.4*  NEUTROABS 7.3  --   --  11.9*  HGB 11.1* 9.8*  10.8* 9.7*  HCT 37.0 33.0* 36.6 32.7*  MCV 85.5 84.0 85.5 85.6  PLT 346 285 249 [id number]   Basic Metabolic Panel:  Recent Labs Lab 07/10/15 0018 07/10/15 0541 07/11/15 0447 07/12/15 0535  NA 141 144 143 140  K 2.9* 2.7* 4.6 4.3  CL 104 106 108 106  CO2 23 26 19* 24  GLUCOSE 203* 190* 122* 117*  BUN 14 14 19 18   CREATININE 1.36* 1.20* 1.35* 1.27*  CALCIUM  9.4 8.9 8.4* 7.9*  MG  --  1.5* 1.4* 1.4*    Liver Function Tests:  Recent Labs Lab 07/10/15 0018 07/12/15 0535  AST 19 20  ALT 48 17  ALKPHOS 252* 152*  BILITOT 0.6 0.5  PROT 6.8 5.4*  ALBUMIN 2.9* 2.0*    Recent Labs Lab 07/10/15 0018  LIPASE 30   CBG:  Recent Labs Lab 07/11/15 2005 07/12/15 0008 07/12/15 0413 07/12/15 0803 07/12/15 1214  GLUCAP 130* 122* 118* 112* 115*    Studies: No results found.   Scheduled Meds: . antiseptic oral rinse  7 mL Mouth Rinse BID  . enoxaparin (LOVENOX) injection  40 mg Subcutaneous Q24H  . escitalopram  10 mg Oral Daily  . insulin aspart  0-15 Units Subcutaneous Q4H  . levothyroxine  112 mcg Oral QAC breakfast  . metoprolol tartrate  25 mg Oral BID  . morphine   Intravenous Q4H  . pantoprazole  40 mg Oral Daily   Continuous Infusions: . lactated ringers 10 mL/hr at 07/10/15 1050  . sodium chloride 0.45 % with kcl 75 mL/hr at 07/12/15 0949   PRN Meds: ALPRAZolam, diphenhydrAMINE **OR** diphenhydrAMINE, methocarbamol (ROBAXIN)  IV, naloxone **AND** sodium chloride flush, ondansetron (ZOFRAN) IV  Time spent: 30 minutes  Author: Berle Mull, MD [facility] Pager: [phone] 07/12/2015 4:13 PM  If 7PM-7AM, please contact night-coverage at [email or web address], password [facility]

## 2015-07-12 NOTE — Progress Notes (Signed)
2 Days Post-Op  Subjective: No complaints  Objective: Vital signs in last 24 hours: Temp:  [97.7 F (36.5 C)-98.6 F (37 C)] 98.6 F (37 C) (05/21 [id number]) Pulse Rate:  [85-111] 85 (05/21 0614) Resp:  [17-26] 24 (05/21 0614) BP: (111-162)/(59-88) 128/63 mmHg (05/21 0614) SpO2:  [2 %-100 %] 98 % (05/21 0614) Last BM Date: 07/09/15  Intake/Output from previous day: 05/20 0701 - 05/21 0700 In: 2561 [P.O.:100; I.V.:2351; NG/GT:110] Out: 765 [Urine:665; Emesis/NG output:100] Intake/Output this shift:    Resp: clear to auscultation bilaterally Cardio: regular rate and rhythm GI: soft, mild tenderness. quiet. incision looks good  Lab Results:   Recent Labs  07/11/15 0447 07/12/15 0535  WBC 12.3* 13.4*  HGB 10.8* 9.7*  HCT 36.6 32.7*  PLT 249 265   BMET  Recent Labs  07/11/15 0447 07/12/15 0535  NA 143 140  K 4.6 4.3  CL 108 106  CO2 19* 24  GLUCOSE 122* 117*  BUN 19 18  CREATININE 1.35* 1.27*  CALCIUM 8.4* 7.9*   PT/INR No results for input(s): LABPROT, INR in the last 72 hours. ABG No results for input(s): PHART, HCO3 in the last 72 hours.  Invalid input(s): PCO2, PO2  Studies/Results: No results found.  Anti-infectives: Anti-infectives    None      Assessment/Plan: s/p Procedure(s): EXPLORATORY LAPAROTOMY  (N/A) SMALL BOWEL RESECTION (N/A) Continue ng and bowel rest.   OOB to chair  LOS: 2 days    TOTH III,PAUL S 07/12/2015

## 2015-07-13 ENCOUNTER — Telehealth: Payer: Self-pay | Admitting: Hematology and Oncology

## 2015-07-13 DIAGNOSIS — C50512 Malignant neoplasm of lower-outer quadrant of left female breast: Secondary | ICD-10-CM

## 2015-07-13 DIAGNOSIS — N183 Chronic kidney disease, stage 3 (moderate): Secondary | ICD-10-CM

## 2015-07-13 DIAGNOSIS — D638 Anemia in other chronic diseases classified elsewhere: Secondary | ICD-10-CM

## 2015-07-13 DIAGNOSIS — K5669 Other intestinal obstruction: Secondary | ICD-10-CM

## 2015-07-13 LAB — CBC
HCT: 27.7 % — ABNORMAL LOW (ref 36.0–46.0)
Hemoglobin: 8.4 g/dL — ABNORMAL LOW (ref 12.0–15.0)
MCH: 25.9 pg — AB (ref 26.0–34.0)
MCHC: 30.3 g/dL (ref 30.0–36.0)
MCV: 85.5 fL (ref 78.0–100.0)
PLATELETS: 222 10*3/uL (ref 150–400)
RBC: 3.24 MIL/uL — AB (ref 3.87–5.11)
RDW: 18 % — AB (ref 11.5–15.5)
WBC: 11.5 10*3/uL — ABNORMAL HIGH (ref 4.0–10.5)

## 2015-07-13 LAB — GLUCOSE, CAPILLARY
GLUCOSE-CAPILLARY: 110 mg/dL — AB (ref 65–99)
GLUCOSE-CAPILLARY: 109 mg/dL — AB (ref 65–99)
GLUCOSE-CAPILLARY: 164 mg/dL — AB (ref 65–99)
GLUCOSE-CAPILLARY: 163 mg/dL — AB (ref 65–99)
Glucose-Capillary: 102 mg/dL — ABNORMAL HIGH (ref 65–99)
Glucose-Capillary: 124 mg/dL — ABNORMAL HIGH (ref 65–99)

## 2015-07-13 LAB — BASIC METABOLIC PANEL
Anion gap: 7 (ref 5–15)
BUN: 16 mg/dL (ref 6–20)
CALCIUM: 7.2 mg/dL — AB (ref 8.9–10.3)
CHLORIDE: 109 mmol/L (ref 101–111)
CO2: 18 mmol/L — AB (ref 22–32)
CREATININE: 1.03 mg/dL — AB (ref 0.44–1.00)
GFR calc non Af Amer: 54 mL/min — ABNORMAL LOW (ref >60)
Glucose, Bld: 93 mg/dL (ref 65–99)
Potassium: 6.9 mmol/L (ref 3.5–5.1)
Sodium: 134 mmol/L — ABNORMAL LOW (ref 135–145)

## 2015-07-13 LAB — POTASSIUM: Potassium: 5.4 mmol/L — ABNORMAL HIGH (ref 3.5–5.1)

## 2015-07-13 LAB — MAGNESIUM: MAGNESIUM: 1.6 mg/dL — AB (ref 1.7–2.4)

## 2015-07-13 MED ORDER — MORPHINE SULFATE (PF) 2 MG/ML IV SOLN
2.0000 mg | INTRAVENOUS | Status: DC | PRN
  Administered 2015-07-13 (×2): 2 mg via INTRAVENOUS
  Filled 2015-07-13 (×2): qty 1

## 2015-07-13 MED ORDER — SODIUM CHLORIDE 0.9 % IV SOLN
INTRAVENOUS | Status: DC
  Administered 2015-07-13 (×2): via INTRAVENOUS

## 2015-07-13 NOTE — Telephone Encounter (Signed)
Faxed most recent office note per PCP request [id number] release id)

## 2015-07-13 NOTE — Progress Notes (Signed)
Patient ID: Monica Morrison, female   DOB: [birth year], 70 y.o.   MRN: [id number]     [address] [place], [place] [address]    Phone: [phone] FAX: [phone]     Subjective: No n/v.  Minimal ngt output.  Up to chair. Thirsty.  Adequate uop.  k 6.9. Asymptomatic.    Objective:  Vital signs:  Filed Vitals:   07/13/15 0000 07/13/15 0212 07/13/15 0422 07/13/15 0616  BP:  135/57  139/72  Pulse:  85  86  Temp:  98.5 F (36.9 C)  98 F (36.7 C)  TempSrc:      Resp: 13 19 17 17   Height:      Weight:      SpO2: 94% 97% 96% 96%    Last BM Date: 07/09/15  Intake/Output   Yesterday:  05/21 0701 - 05/22 0700 In: 2011 [P.O.:110; I.V.:1871; NG/GT:30] Out: 1055 [Urine:1000; Emesis/NG output:55] This shift: I/O last 3 completed shifts: In: 3847 [P.O.:170; I.V.:3597; NG/GT:80] Out: 9767 [Urine:1550; Emesis/NG output:55]    Physical Exam: General: Pt awake/alert/oriented x4 in no acute distress  Abdomen: Soft.  Nondistended.   Mildly tender at incisions only.  Midline incision, no erythema, edges are approximated.  No evidence of peritonitis.  No incarcerated hernias.    Problem List:   Principal Problem:   SBO (small bowel obstruction) (HCC) Active Problems:   Breast cancer of lower-outer quadrant of left female breast ([place])   Diabetes mellitus with complication ([place])   CKD (chronic kidney disease) stage 3, GFR 30-59 ml/min    Results:   Labs: Results for orders placed or performed during the hospital encounter of 07/10/15 (from the past 48 hour(s))  Glucose, capillary     Status: Abnormal   Collection Time: 07/11/15  8:37 AM  Result Value Ref Range   Glucose-Capillary 146 (H) 65 - 99 mg/dL  Glucose, capillary     Status: Abnormal   Collection Time: 07/11/15 12:53 PM  Result Value Ref Range   Glucose-Capillary 155 (H) 65 - 99 mg/dL  Glucose, capillary     Status: Abnormal   Collection  Time: 07/11/15  4:25 PM  Result Value Ref Range   Glucose-Capillary 116 (H) 65 - 99 mg/dL  Glucose, capillary     Status: Abnormal   Collection Time: 07/11/15  8:05 PM  Result Value Ref Range   Glucose-Capillary 130 (H) 65 - 99 mg/dL   Comment 1 Notify RN    Comment 2 Document in Chart   Glucose, capillary     Status: Abnormal   Collection Time: 07/12/15 12:08 AM  Result Value Ref Range   Glucose-Capillary 122 (H) 65 - 99 mg/dL   Comment 1 Notify RN    Comment 2 Document in Chart   Glucose, capillary     Status: Abnormal   Collection Time: 07/12/15  4:13 AM  Result Value Ref Range   Glucose-Capillary 118 (H) 65 - 99 mg/dL  Comprehensive metabolic panel     Status: Abnormal   Collection Time: 07/12/15  5:35 AM  Result Value Ref Range   Sodium 140 135 - 145 mmol/L   Potassium 4.3 3.5 - 5.1 mmol/L   Chloride 106 101 - 111 mmol/L   CO2 24 22 - 32 mmol/L   Glucose, Bld 117 (H) 65 - 99 mg/dL   BUN 18 6 - 20 mg/dL   Creatinine, Ser 1.27 (H) 0.44 - 1.00 mg/dL  Calcium 7.9 (L) 8.9 - 10.3 mg/dL   Total Protein 5.4 (L) 6.5 - 8.1 g/dL   Albumin 2.0 (L) 3.5 - 5.0 g/dL   AST 20 15 - 41 U/L   ALT 17 14 - 54 U/L   Alkaline Phosphatase 152 (H) 38 - 126 U/L   Total Bilirubin 0.5 0.3 - 1.2 mg/dL   GFR calc non Af Amer 42 (L) >60 mL/min   GFR calc Af Amer 48 (L) >60 mL/min    Comment: (NOTE) The eGFR has been calculated using the CKD EPI equation. This calculation has not been validated in all clinical situations. eGFR's persistently <60 mL/min signify possible Chronic Kidney Disease.    Anion gap 10 5 - 15  CBC with Differential/Platelet     Status: Abnormal   Collection Time: 07/12/15  5:35 AM  Result Value Ref Range   WBC 13.4 (H) 4.0 - 10.5 K/uL   RBC 3.82 (L) 3.87 - 5.11 MIL/uL   Hemoglobin 9.7 (L) 12.0 - 15.0 g/dL   HCT 32.7 (L) 36.0 - 46.0 %   MCV 85.6 78.0 - 100.0 fL   MCH 25.4 (L) 26.0 - 34.0 pg   MCHC 29.7 (L) 30.0 - 36.0 g/dL   RDW 18.1 (H) 11.5 - 15.5 %   Platelets  265 150 - 400 K/uL   Neutrophils Relative % 88 %   Neutro Abs 11.9 (H) 1.7 - 7.7 K/uL   Lymphocytes Relative 4 %   Lymphs Abs 0.5 (L) 0.7 - 4.0 K/uL   Monocytes Relative 6 %   Monocytes Absolute 0.8 0.1 - 1.0 K/uL   Eosinophils Relative 2 %   Eosinophils Absolute 0.2 0.0 - 0.7 K/uL   Basophils Relative 0 %   Basophils Absolute 0.0 0.0 - 0.1 K/uL  Magnesium     Status: Abnormal   Collection Time: 07/12/15  5:35 AM  Result Value Ref Range   Magnesium 1.4 (L) 1.7 - 2.4 mg/dL  Glucose, capillary     Status: Abnormal   Collection Time: 07/12/15  8:03 AM  Result Value Ref Range   Glucose-Capillary 112 (H) 65 - 99 mg/dL   Comment 1 Notify RN   Glucose, capillary     Status: Abnormal   Collection Time: 07/12/15 12:14 PM  Result Value Ref Range   Glucose-Capillary 115 (H) 65 - 99 mg/dL   Comment 1 Notify RN   Glucose, capillary     Status: Abnormal   Collection Time: 07/12/15  4:14 PM  Result Value Ref Range   Glucose-Capillary 114 (H) 65 - 99 mg/dL   Comment 1 Notify RN   Glucose, capillary     Status: Abnormal   Collection Time: 07/12/15  8:21 PM  Result Value Ref Range   Glucose-Capillary 123 (H) 65 - 99 mg/dL   Comment 1 Notify RN    Comment 2 Document in Chart   Glucose, capillary     Status: Abnormal   Collection Time: 07/13/15 12:21 AM  Result Value Ref Range   Glucose-Capillary 124 (H) 65 - 99 mg/dL   Comment 1 Notify RN    Comment 2 Document in Chart   Glucose, capillary     Status: Abnormal   Collection Time: 07/13/15  4:24 AM  Result Value Ref Range   Glucose-Capillary 102 (H) 65 - 99 mg/dL  Basic metabolic panel     Status: Abnormal   Collection Time: 07/13/15  5:33 AM  Result Value Ref Range   Sodium 134 (L) 135 -  145 mmol/L   Potassium 6.9 (HH) 3.5 - 5.1 mmol/L    Comment: DELTA CHECK NOTED NO VISIBLE HEMOLYSIS CRITICAL RESULT CALLED TO, READ BACK BY AND VERIFIED WITH: Gilmer Mor [phone] [facility]    Chloride 109 101 - 111 mmol/L   CO2 18 (L) 22 - 32  mmol/L   Glucose, Bld 93 65 - 99 mg/dL   BUN 16 6 - 20 mg/dL   Creatinine, Ser 1.03 (H) 0.44 - 1.00 mg/dL   Calcium 7.2 (L) 8.9 - 10.3 mg/dL   GFR calc non Af Amer 54 (L) >60 mL/min   GFR calc Af Amer >60 >60 mL/min    Comment: (NOTE) The eGFR has been calculated using the CKD EPI equation. This calculation has not been validated in all clinical situations. eGFR's persistently <60 mL/min signify possible Chronic Kidney Disease.    Anion gap 7 5 - 15  CBC     Status: Abnormal   Collection Time: 07/13/15  5:33 AM  Result Value Ref Range   WBC 11.5 (H) 4.0 - 10.5 K/uL   RBC 3.24 (L) 3.87 - 5.11 MIL/uL   Hemoglobin 8.4 (L) 12.0 - 15.0 g/dL   HCT 27.7 (L) 36.0 - 46.0 %   MCV 85.5 78.0 - 100.0 fL   MCH 25.9 (L) 26.0 - 34.0 pg   MCHC 30.3 30.0 - 36.0 g/dL   RDW 18.0 (H) 11.5 - 15.5 %   Platelets 222 150 - 400 K/uL  Magnesium     Status: Abnormal   Collection Time: 07/13/15  5:33 AM  Result Value Ref Range   Magnesium 1.6 (L) 1.7 - 2.4 mg/dL    Imaging / Studies: No results found.  Medications / Allergies:  Scheduled Meds: . antiseptic oral rinse  7 mL Mouth Rinse BID  . enoxaparin (LOVENOX) injection  40 mg Subcutaneous Q24H  . escitalopram  10 mg Oral Daily  . insulin aspart  0-15 Units Subcutaneous Q4H  . levothyroxine  112 mcg Oral QAC breakfast  . metoprolol tartrate  25 mg Oral BID  . morphine   Intravenous Q4H  . pantoprazole  40 mg Oral Daily   Continuous Infusions: . lactated ringers 10 mL/hr at 07/10/15 1050   PRN Meds:.ALPRAZolam, diphenhydrAMINE **OR** diphenhydrAMINE, methocarbamol (ROBAXIN)  IV, naloxone **AND** sodium chloride flush, ondansetron (ZOFRAN) IV  Antibiotics: Anti-infectives    None        Assessment/Plan SBO due to tumor POD#3 exploratory laparotomy, SBR---Dr. Grandville Silos -clamp ngt and allow for sips only today -mobilize, IS, DC PCA -await pathology  FEN-hyperkalemia, 6.9, repeat stat, DC KCL from IVF VTE  prophylaxis-SCD/lovenox    Erby Pian, [facility] Surgery Pager [phone]) For consults and floor pages call [phone](7A-4:30P)  07/13/2015

## 2015-07-13 NOTE — Clinical Social Work Placement (Addendum)
   CLINICAL SOCIAL WORK PLACEMENT  NOTE  Date:  07/13/2015  Patient Details  Name: Monica Morrison MRN: [id number] Date of Birth: Mar 19, 1945  Clinical Social Work is seeking post-discharge placement for this patient at the [place] level of care (*CSW will initial, date and re-position this form in  chart as items are completed):  Yes   Patient/family provided with [place] Work Department's list of facilities offering this level of care within the geographic area requested by the patient (or if unable, by the patient's family).  Yes   Patient/family informed of their freedom to choose among providers that offer the needed level of care, that participate in Medicare, Medicaid or managed care program needed by the patient, have an available bed and are willing to accept the patient.  Yes   Patient/family informed of 's ownership interest in [facility] and [facility], as well as of the fact that they are under no obligation to receive care at these facilities.  PASRR submitted to EDS on 07/13/15     PASRR number received on 07/13/15     Existing PASRR number confirmed on       FL2 transmitted to all facilities in geographic area requested by pt/family on 07/13/15     FL2 transmitted to all facilities within larger geographic area on       Patient informed that his/her managed care company has contracts with or will negotiate with certain facilities, including the following:         07-14-15   Patient/family informed of bed offers received. (updated Evette Cristal, MSW, [facility], 07-16-15)  Patient chooses bed at  [facility] (updated Evette Cristal, MSW, [place], 07-16-15)    Physician recommends and patient chooses bed at      Patient to be transferred to  [facility] on  07-16-15. (updated Evette Cristal, MSW, [place], 07-16-15)  Patient to be transferred to facility by  [facility] (updated Evette Cristal, MSW, [place], 07-16-15)      Patient family notified on  07-16-15 of transfer. (updated Evette Cristal, MSW, [place], 07-16-15)  Name of family member notified:   Christean Grief patient's son (updated Evette Cristal, MSW, [place], 07-16-15)   PHYSICIAN Please sign FL2     Additional Comment:    _______________________________________________ Ross Ludwig, LCSWA 07/13/2015, 6:14 PM

## 2015-07-13 NOTE — NC FL2 (Signed)
[place] LEVEL OF CARE SCREENING TOOL     IDENTIFICATION  Patient Name: Monica Morrison Birthdate: April 21, 1945 Sex: female Admission Date (Current Location): 07/10/2015  [facility] and [place] Number:  Herbalist and Address:  The [place], [place] [address] [place], [place] [address]      Provider Number: [id number]  Attending Physician Name and Address:  Robbie Lis, MD  Relative Name and Phone Number:  Hortencia, Peeler [phone]    Current Level of Care: Hospital Recommended Level of Care: [place] Prior Approval Number:    Date Approved/Denied:   PASRR Number:   [id number]   Discharge Plan: SNF    Current Diagnoses: Patient Active Problem List   Diagnosis Date Noted  . Hypertension 06/30/2015  . Hypokalemia 06/30/2015  . Dehydration 06/30/2015  . Small bowel obstruction ([place]) 06/29/2015  . Hypothyroidism 06/22/2015  . Campylobacter enteritis 06/18/2015  . LFT elevation 06/18/2015  . CKD (chronic kidney disease) stage 3, GFR 30-59 ml/min 06/18/2015  . Acute-on-chronic kidney injury ([place]) 06/18/2015  . Chronic kidney disease 06/17/2015  . Diabetes mellitus with complication ([place])   . Malignant neoplasm of female breast ([place])   . SBO (small bowel obstruction) ([place]) 06/15/2015  . Breast cancer of lower-outer quadrant of left female breast ([place]) 03/07/2012    Orientation RESPIRATION BLADDER Height & Weight     Self, Time, Situation, Place  Normal Indwelling catheter Weight: 167 lb (75.751 kg) Height:  5\' 2"  (157.5 cm)  BEHAVIORAL SYMPTOMS/MOOD NEUROLOGICAL BOWEL NUTRITION STATUS      Continent Diet (Clear Liquid Diet)  AMBULATORY STATUS COMMUNICATION OF NEEDS Skin   Limited Assist Verbally Surgical wounds                       Personal Care Assistance Level of Assistance  Bathing, Dressing Bathing Assistance: Limited assistance   Dressing Assistance: Limited assistance      Functional Limitations Info  Hearing, Speech, Sight Sight Info: Adequate Hearing Info: Adequate Speech Info: Adequate    SPECIAL CARE FACTORS FREQUENCY  Speech therapy, OT (By licensed OT), PT (By licensed PT)     PT Frequency: 5x a week OT Frequency: 5x a week     Speech Therapy Frequency: 3x a week      Contractures      Additional Factors Info  Allergies, Code Status Code Status Info: Full Allergies Info: NKA           Current Medications (07/13/2015):  This is the current hospital active medication list Current Facility-Administered Medications  Medication Dose Route Frequency Provider Last Rate Last Dose  . 0.9 %  sodium chloride infusion   Intravenous Continuous Emina Riebock, NP 75 mL/hr at 07/13/15 1101    . ALPRAZolam Duanne Moron) tablet 0.25 mg  0.25 mg Oral QHS PRN Etta Quill, DO      . antiseptic oral rinse (CPC / CETYLPYRIDINIUM CHLORIDE 0.05%) solution 7 mL  7 mL Mouth Rinse BID Autumn Messing III, MD   7 mL at 07/12/15 2222  . enoxaparin (LOVENOX) injection 40 mg  40 mg Subcutaneous Q24H Nat Christen, PA-C   40 mg at 07/13/15 1101  . escitalopram (LEXAPRO) tablet 10 mg  10 mg Oral Daily Etta Quill, DO   10 mg at 07/13/15 1101  . insulin aspart (novoLOG) injection 0-15 Units  0-15 Units Subcutaneous Q4H Gardiner Barefoot, NP   3 Units at 07/13/15 1600  .  lactated ringers infusion   Intravenous Continuous Catalina Gravel, MD 10 mL/hr at 07/10/15 1050    . levothyroxine (SYNTHROID, LEVOTHROID) tablet 112 mcg  112 mcg Oral QAC breakfast Etta Quill, DO   112 mcg at 07/13/15 0825  . methocarbamol (ROBAXIN) 500 mg in dextrose 5 % 50 mL IVPB  500 mg Intravenous Q6H PRN Nat Christen, PA-C      . metoprolol tartrate (LOPRESSOR) tablet 25 mg  25 mg Oral BID Etta Quill, DO   25 mg at 07/13/15 1101  . morphine 2 MG/ML injection 2 mg  2 mg Intravenous Q2H PRN Emina Riebock, NP   2 mg at 07/13/15 1746  . pantoprazole (PROTONIX) EC tablet 40 mg  40 mg  Oral Daily Etta Quill, DO   40 mg at 07/13/15 1101     Discharge Medications: Please see discharge summary for a list of discharge medications.  Relevant Imaging Results:  Relevant Lab Results:   Additional Information SSN [id number]  Ross Ludwig, [place]

## 2015-07-13 NOTE — Progress Notes (Signed)
  Repeat K 5.4.  K removed from IVF.  Repeat labs in AM. Further management per primary team.  Erby Pian, ANP-BC

## 2015-07-13 NOTE — Progress Notes (Addendum)
Patient ID: Monica Morrison, female   DOB: Jul 13, 1945, 70 y.o.   MRN: [id number]  PROGRESS NOTE    Melana Robichaux  [id number] DOB: 1945-11-24 DOA: 07/10/2015  PCP: Gara Kroner, MD   Brief Narrative:  70 year old female with past medical history of breast cancer, diabetes, hypertension, dyslipidemia who presented to [facility] 07/10/2015 with nausea, vomiting and abdominal pain. She was found to have small bowel obstruction. She underwent exploratory laparotomy and small bowel resection 07/10/2015.  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 - Pathology still pending as of 07/13/2015 - Patient still has NG tube but it was clamped this morning - Per surgery, allow sips of clears  History of breast CA with possible liver metastasis seen on CT on 5/8 - Awaiting pathology results from small bowel biopsy - Her oncologist is Dr. 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  Objective: Filed Vitals:   07/13/15 0212 07/13/15 0422 07/13/15 0616 07/13/15 1223  BP: 135/57  139/72 161/84  Pulse: 85  86 74  Temp: 98.5 F (36.9 C)  98 F (36.7 C) 98 F (36.7 C)  TempSrc:    Oral  Resp: 19 17 17 19   Height:      Weight:      SpO2: 97% 96% 96% 98%    Intake/Output Summary (Last 24 hours) at 07/13/15 1408 Last data filed at 07/13/15 1100  Gross per 24 hour  Intake   2151 ml  Output    705 ml  Net   1446 ml   Filed Weights   07/10/15 0003  Weight: 75.751 kg (167 lb)    Examination:  General exam: Appears calm and comfortable  Respiratory system: Clear to auscultation. Respiratory effort normal. Cardiovascular system: S1 & S2 heard, RRR. No JVD, murmurs, rubs, gallops or clicks. No pedal edema. Gastrointestinal system: Abdomen is nondistended, NG tube in place  Central nervous system: Alert and oriented. No focal neurological deficits. Extremities: Symmetric 5 x 5 power. Trace LE pitting edema Skin: No rashes, lesions or ulcers Psychiatry: Judgement and insight appear normal. Mood & affect appropriate.   Data Reviewed: I have personally reviewed following labs and imaging studies  CBC:  Recent Labs Lab 07/10/15 0018 07/10/15 0541 07/11/15 0447 07/12/15 0535 07/13/15 0533  WBC 9.0 7.6 12.3* 13.4* 11.5*  NEUTROABS 7.3  --   --  11.9*  --   HGB 11.1* 9.8* 10.8* 9.7* 8.4*  HCT 37.0 33.0* 36.6 32.7* 27.7*  MCV 85.5 84.0 85.5 85.6 85.5  PLT 346 285 249 265 [id number]   Basic Metabolic Panel:  Recent Labs Lab 07/10/15 0018 07/10/15 0541 07/11/15 0447 07/12/15 0535 07/13/15 0533 07/13/15 0748  NA 141 144 143 140 134*  --   K 2.9* 2.7* 4.6 4.3 6.9* 5.4*  CL 104 106 108 106 109  --   CO2 23 26 19* 24 18*  --   GLUCOSE 203* 190* 122* 117* 93  --     BUN 14 14 19 18 16   --   CREATININE 1.36* 1.20* 1.35* 1.27* 1.03*  --   CALCIUM 9.4 8.9 8.4* 7.9* 7.2*  --   MG  --  1.5* 1.4* 1.4* 1.6*  --    GFR: Estimated Creatinine Clearance: 48.5 mL/min (by C-G formula based on Cr of 1.03). Liver Function Tests:  Recent Labs Lab 07/10/15 0018 07/12/15 0535  AST 19 20  ALT 48 17  ALKPHOS 252* 152*  BILITOT 0.6 0.5  PROT 6.8 5.4*  ALBUMIN 2.9* 2.0*    Recent Labs Lab 07/10/15 0018  LIPASE 30   No results for input(s): AMMONIA in the last 168 hours. Coagulation Profile: No results for input(s): INR, PROTIME in the last 168 hours. Cardiac Enzymes: No results for input(s): CKTOTAL, CKMB, CKMBINDEX, TROPONINI in the last 168 hours. BNP (last 3 results) No results for input(s): PROBNP in the last 8760 hours. HbA1C: No results for input(s): HGBA1C in the last 72 hours. CBG:  Recent Labs Lab 07/12/15 2021 07/13/15 0021 07/13/15 0424 07/13/15 0834 07/13/15 1157  GLUCAP 123* 124* 102* 110* 109*   Lipid Profile: No results for input(s): CHOL, HDL, LDLCALC, TRIG, CHOLHDL, LDLDIRECT in the last 72 hours. Thyroid Function Tests: No results for input(s): TSH, T4TOTAL, FREET4, T3FREE, THYROIDAB in the last 72 hours. Anemia Panel: No results for input(s): VITAMINB12, FOLATE, FERRITIN, TIBC, IRON, RETICCTPCT in the last 72 hours. Urine analysis:    Component Value Date/Time   COLORURINE YELLOW 07/10/2015 0018   APPEARANCEUR CLEAR 07/10/2015 0018   LABSPEC 1.019 07/10/2015 0018   PHURINE 5.5 07/10/2015 0018   GLUCOSEU NEGATIVE 07/10/2015 0018   HGBUR NEGATIVE 07/10/2015 0018   BILIRUBINUR NEGATIVE 07/10/2015 0018   KETONESUR 15* 07/10/2015 0018   PROTEINUR NEGATIVE 07/10/2015 0018   NITRITE NEGATIVE 07/10/2015 0018   LEUKOCYTESUR NEGATIVE 07/10/2015 0018   Sepsis Labs: @LABRCNTIP (procalcitonin:4,lacticidven:4)   Recent Results (from the past 240 hour(s))  Surgical pcr screen     Status: None   Collection Time: 07/10/15   5:17 AM  Result Value Ref Range Status   MRSA, PCR NEGATIVE NEGATIVE Final   Staphylococcus aureus NEGATIVE NEGATIVE Final    Comment:        The Xpert SA Assay (FDA approved for NASAL specimens in patients over 55 years of age), is one component of a comprehensive surveillance program.  Test performance has been validated by [facility] for patients greater than or equal to 63 year old. It is not intended to diagnose infection nor to guide or monitor treatment.       Radiology Studies: Dg Abd Acute W/chest 07/10/2015  Small bowel obstruction.    Scheduled Meds: . antiseptic oral rinse  7 mL Mouth Rinse BID  . enoxaparin (LOVENOX) injection  40 mg Subcutaneous Q24H  . escitalopram  10 mg Oral Daily  . insulin aspart  0-15 Units Subcutaneous Q4H  .  levothyroxine  112 mcg Oral QAC breakfast  . metoprolol tartrate  25 mg Oral BID  . pantoprazole  40 mg Oral Daily   Continuous Infusions: . sodium chloride 75 mL/hr at 07/13/15 1101  . lactated ringers 10 mL/hr at 07/10/15 1050     LOS: 3 days    Time spent: 25 minutes  Greater than 50% of the time spent on counseling and coordinating the care.   Leisa Lenz, MD [facility] Pager [phone]  If 7PM-7AM, please contact night-coverage [email or web address] Password [email or web address] 07/13/2015, 2:08 PM

## 2015-07-13 NOTE — Clinical Social Work Note (Signed)
Clinical Social Work Assessment  Patient Details  Name: Dustine Laporta MRN: [id number] Date of Birth: [birth year]  Date of referral:  07/13/15               Reason for consult:  Facility Placement                Permission sought to share information with:  Facility Sport and exercise psychologist, Family Supports Permission granted to share information::  Yes, Verbal Permission Granted  Name::     [place], Hansard [phone] or Tyler,Martha [phone]  Agency::  SNF admissions  Relationship::     Contact Information:     Housing/Transportation Living arrangements for the past 2 months:  Single Family Home Source of Information:  Patient, Adult Children Patient Interpreter Needed:  None Criminal Activity/Legal Involvement Pertinent to Current Situation/Hospitalization:  No - Comment as needed Significant Relationships:  Adult Children, Siblings Lives with:  Self Do you feel safe going back to the place where you live?  No (Patient needs some short term rehab before she is able to return back home.) Need for family participation in patient care:  Yes (Comment) (Patient requests to have sister and son to help with decision making.)  Care giving concerns:  Patient feels she needs some short term rehab before she is able to return back home.  Social Worker assessment / plan:  Patient is a 70 year old female who lives alone she is alert and oriented x4, and her sister lives next door.  Patient expressed she has not been to SNF before for short term rehab.  CSW explained to patient what the role of social worker is and what to expect with the discharge planning process.  Patient was explained what to expect at facility and how insurance pays for stay at [facility].  Patient requested that CSW contact her son and sister to talk about SNF placement and process for transferring to SNF from hospital.  CSW contacted patient's son and explained process of short term placement for rehab.  Patient and son  agreed to have [facility] fax information out to SNFs in [facility].    Employment status:  Retired Nurse, adult PT Recommendations:  [place] / Referral to community resources:  [place]  Patient/Family's Response to care:  Patient in agreement to going to SNF for rehab.  Patient/Family's Understanding of and Emotional Response to Diagnosis, Current Treatment, and Prognosis:  Patient ware of current diagnosis and current treatment plan.  Emotional Assessment Appearance:  Appears stated age Attitude/Demeanor/Rapport:    Affect (typically observed):  Appropriate, Calm, Pleasant Orientation:  Oriented to Self, Oriented to Place, Oriented to  Time, Oriented to Situation Alcohol / Substance use:  Not Applicable Psych involvement (Current and /or in the community):  No (Comment)  Discharge Needs  Concerns to be addressed:  Lack of Support Readmission within the last 30 days:  No Current discharge risk:  Lack of support system, Lives alone Barriers to Discharge:  No Barriers Identified, Continued Medical Work up   Anell Barr 07/13/2015, 6:08 PM

## 2015-07-13 NOTE — Evaluation (Signed)
Physical Therapy Evaluation Patient Details Name: Monica Morrison MRN: [id number] DOB: March 21, 1945 Today's Date: 07/13/2015   History of Present Illness  70 year old female with past medical history of breast cancer, diabetes, hypertension, dyslipidemia who presented to [facility] 07/10/2015 with nausea, vomiting and abdominal pain. She was found to have small bowel obstruction. She underwent exploratory laparotomy and small bowel resection 07/10/2015.  Clinical Impression  Pt admitted with above diagnosis. Pt currently with functional limitations due to the deficits listed below (see PT Problem List). Pt will benefit from skilled PT to increase their independence and safety with mobility to allow discharge to SNF for further rehabilitation before ultimately returning home. Pt ambulating 16 feet with rw and min guard assist. Mobility primarily limited by pain at this time.       Follow Up Recommendations SNF;Supervision for mobility/OOB    Equipment Recommendations  None recommended by PT    Recommendations for Other Services       Precautions / Restrictions Precautions Precautions: Fall Restrictions Weight Bearing Restrictions: No      Mobility  Bed Mobility Overal bed mobility: Needs Assistance Bed Mobility: Supine to Sit   Sidelying to sit: Mod assist       General bed mobility comments: mod assist needed for trunk from sidelying. Verbal and tactile cues for sequence.   Transfers Overall transfer level: Needs assistance Equipment used: Rolling walker (2 wheeled) Transfers: Sit to/from Stand Sit to Stand: Min assist         General transfer comment: cues provided for hand position, encouraging forward scoot prior to standing.   Ambulation/Gait Ambulation/Gait assistance: Min guard Ambulation Distance (Feet): 16 Feet Assistive device: Rolling walker (2 wheeled) Gait Pattern/deviations: Shuffle Gait velocity: decreased   General Gait Details: Pt moving  slowly with ambulation.   Stairs            Wheelchair Mobility    Modified Rankin (Stroke Patients Only)       Balance Overall balance assessment: Needs assistance Sitting-balance support: No upper extremity supported Sitting balance-Leahy Scale: Fair     Standing balance support: Bilateral upper extremity supported Standing balance-Leahy Scale: Poor Standing balance comment: using rw for support                             Pertinent Vitals/Pain Pain Assessment: Faces Faces Pain Scale: Hurts even more Pain Location: abdomen Pain Descriptors / Indicators: Sore (pain) Pain Intervention(s): Limited activity within patient's tolerance;Monitored during session    Home Living Family/patient expects to be discharged to:: Skilled nursing facility Living Arrangements: Alone                    Prior Function Level of Independence: Needs assistance   Gait / Transfers Assistance Needed: using rw for ambulation           Hand Dominance        Extremity/Trunk Assessment   Upper Extremity Assessment: Generalized weakness           Lower Extremity Assessment: Generalized weakness      Cervical / Trunk Assessment: Kyphotic  Communication   Communication: HOH  Cognition Arousal/Alertness: Awake/alert Behavior During Therapy: WFL for tasks assessed/performed Overall Cognitive Status: Within Functional Limits for tasks assessed                      General Comments      Exercises  Assessment/Plan    PT Assessment Patient needs continued PT services  PT Diagnosis Difficulty walking   PT Problem List Decreased strength;Decreased activity tolerance;Decreased balance;Decreased mobility  PT Treatment Interventions DME instruction;Gait training;Functional mobility training;Therapeutic activities;Therapeutic exercise;Balance training;Patient/family education   PT Goals (Current goals can be found in the Care Plan section)  Acute Rehab PT Goals Patient Stated Goal: get around better PT Goal Formulation: With patient Time For Goal Achievement: 07/27/15 Potential to Achieve Goals: Good    Frequency Min 3X/week   Barriers to discharge Decreased caregiver support      Co-evaluation               End of Session Equipment Utilized During Treatment: Gait belt Activity Tolerance: Patient limited by pain Patient left: in chair;with call bell/phone within reach Nurse Communication: Mobility status         Time: [id number] PT Time Calculation (min) (ACUTE ONLY): 26 min   Charges:   PT Evaluation $PT Eval Moderate Complexity: 1 Procedure PT Treatments $Gait Training: 8-22 mins   PT G Codes:        Cassell Clement, PT, CSCS Pager [phone] Office [phone]  07/13/2015, 4:22 PM

## 2015-07-13 NOTE — Anesthesia Postprocedure Evaluation (Signed)
Anesthesia Post Note  Patient: Public affairs consultant  Procedure(s) Performed: Procedure(s) (LRB): EXPLORATORY LAPAROTOMY  (N/A) SMALL BOWEL RESECTION (N/A)  Patient location during evaluation: PACU Anesthesia Type: General Level of consciousness: awake and alert Pain management: pain level controlled Vital Signs Assessment: post-procedure vital signs reviewed and stable Respiratory status: spontaneous breathing, nonlabored ventilation, respiratory function stable and patient connected to nasal cannula oxygen Cardiovascular status: blood pressure returned to baseline and stable Postop Assessment: no signs of nausea or vomiting Anesthetic complications: no    Last Vitals:  Filed Vitals:   07/13/15 0212 07/13/15 0616  BP: 135/57 139/72  Pulse: 85 86  Temp: 36.9 C 36.7 C  Resp: 19 17    Last Pain:  Filed Vitals:   07/13/15 0657  PainSc: Asleep                 Catalina Gravel

## 2015-07-14 ENCOUNTER — Encounter (HOSPITAL_COMMUNITY): Payer: Self-pay | Admitting: General Surgery

## 2015-07-14 ENCOUNTER — Telehealth: Payer: Self-pay | Admitting: Hematology and Oncology

## 2015-07-14 ENCOUNTER — Other Ambulatory Visit: Payer: Self-pay | Admitting: *Deleted

## 2015-07-14 DIAGNOSIS — E038 Other specified hypothyroidism: Secondary | ICD-10-CM

## 2015-07-14 LAB — GLUCOSE, CAPILLARY
GLUCOSE-CAPILLARY: 157 mg/dL — AB (ref 65–99)
GLUCOSE-CAPILLARY: 209 mg/dL — AB (ref 65–99)
GLUCOSE-CAPILLARY: 201 mg/dL — AB (ref 65–99)
GLUCOSE-CAPILLARY: 179 mg/dL — AB (ref 65–99)
Glucose-Capillary: 145 mg/dL — ABNORMAL HIGH (ref 65–99)
Glucose-Capillary: 156 mg/dL — ABNORMAL HIGH (ref 65–99)

## 2015-07-14 LAB — BASIC METABOLIC PANEL
ANION GAP: 9 (ref 5–15)
BUN: 15 mg/dL (ref 6–20)
CHLORIDE: 109 mmol/L (ref 101–111)
CO2: 20 mmol/L — ABNORMAL LOW (ref 22–32)
Calcium: 8.2 mg/dL — ABNORMAL LOW (ref 8.9–10.3)
Creatinine, Ser: 0.88 mg/dL (ref 0.44–1.00)
Glucose, Bld: 163 mg/dL — ABNORMAL HIGH (ref 65–99)
POTASSIUM: 4 mmol/L (ref 3.5–5.1)
SODIUM: 138 mmol/L (ref 135–145)

## 2015-07-14 LAB — CBC
HEMATOCRIT: 32.2 % — AB (ref 36.0–46.0)
HEMOGLOBIN: 9.7 g/dL — AB (ref 12.0–15.0)
MCH: 25.1 pg — ABNORMAL LOW (ref 26.0–34.0)
MCHC: 30.1 g/dL (ref 30.0–36.0)
MCV: 83.2 fL (ref 78.0–100.0)
Platelets: 302 10*3/uL (ref 150–400)
RBC: 3.87 MIL/uL (ref 3.87–5.11)
RDW: 17.9 % — ABNORMAL HIGH (ref 11.5–15.5)
WBC: 10.7 10*3/uL — AB (ref 4.0–10.5)

## 2015-07-14 MED ORDER — DOCUSATE SODIUM 100 MG PO CAPS
100.0000 mg | ORAL_CAPSULE | Freq: Two times a day (BID) | ORAL | Status: DC
  Administered 2015-07-14 – 2015-07-16 (×2): 100 mg via ORAL
  Filled 2015-07-14 (×4): qty 1

## 2015-07-14 MED ORDER — SODIUM CHLORIDE 0.9% FLUSH
3.0000 mL | Freq: Two times a day (BID) | INTRAVENOUS | Status: DC
  Administered 2015-07-14: 3 mL via INTRAVENOUS

## 2015-07-14 MED ORDER — SODIUM CHLORIDE 0.9% FLUSH: 3.0000 mL | INTRAVENOUS | Status: DC | PRN

## 2015-07-14 MED ORDER — ACETAMINOPHEN 325 MG PO TABS: 325.0000 mg | ORAL_TABLET | Freq: Four times a day (QID) | ORAL | Status: DC | PRN

## 2015-07-14 MED ORDER — HYDROCODONE-ACETAMINOPHEN 5-325 MG PO TABS
1.0000 | ORAL_TABLET | ORAL | Status: DC | PRN
  Administered 2015-07-14: 2 via ORAL
  Administered 2015-07-15: 1 via ORAL
  Administered 2015-07-16: 2 via ORAL
  Administered 2015-07-16: 1 via ORAL
  Filled 2015-07-14: qty 2
  Filled 2015-07-14 (×2): qty 1
  Filled 2015-07-14: qty 2

## 2015-07-14 NOTE — Telephone Encounter (Signed)
left msg confirming 5/31 appt

## 2015-07-14 NOTE — Progress Notes (Signed)
Patient ID: Monica Morrison, female   DOB: [birth year], 70 y.o.   MRN: [id number]      [address] [place], [place] [address]    Phone: [phone] FAX: [phone]     Subjective: 3 BMs yesterday. Tolerating clears.  Up with therapies.  Sat up in chair most of the day.  Asking for patho, not resulted yet. Vss.  Afebrile.  K 4 today.   Objective:  Vital signs:  Filed Vitals:   07/13/15 1528 07/13/15 2131 07/14/15 0517 07/14/15 0549  BP: 156/85 133/76 179/76 142/68  Pulse: 80 89 87   Temp: 98.2 F (36.8 C) 98.5 F (36.9 C) 98.3 F (36.8 C)   TempSrc: Oral Oral Oral   Resp: _0 Height:      Weight:      SpO2: 97% 98% 97%     Last BM Date: 07/09/15  Intake/Output   Yesterday:  05/22 0701 - 05/23 0700 In: 1329 [P.O.:460; I.V.:869] Out: 875 [Urine:875] This shift: I/O last 3 completed shifts: In: 2360 [P.O.:520; I.V.:1840] Out: 9191 [Urine:1525; Emesis/NG output:30]    Physical Exam: General: Pt awake/alert/oriented x4 in no acute distress Chest: cta.  No chest wall pain w good excursion CV:  Pulses intact.  Regular rhythm Abdomen: Soft.  Nondistended.  Mildly tender at incisions only. Incision without erythema, wound edges are approximated.  No evidence of peritonitis.  No incarcerated hernias. Ext:  SCDs BLE.  No mjr edema.  No cyanosis Skin: No petechiae / purpura   Problem List:   Principal Problem:   SBO (small bowel obstruction) (HCC) Active Problems:   Breast cancer of lower-outer quadrant of left female breast ([place])   Diabetes mellitus with complication ([place])   CKD (chronic kidney disease) stage 3, GFR 30-59 ml/min    Results:   Labs: Results for orders placed or performed during the hospital encounter of 07/10/15 (from the past 48 hour(s))  Glucose, capillary     Status: Abnormal   Collection Time: 07/12/15  8:03 AM  Result Value Ref Range   Glucose-Capillary 112 (H) 65  - 99 mg/dL   Comment 1 Notify RN   Glucose, capillary     Status: Abnormal   Collection Time: 07/12/15 12:14 PM  Result Value Ref Range   Glucose-Capillary 115 (H) 65 - 99 mg/dL   Comment 1 Notify RN   Glucose, capillary     Status: Abnormal   Collection Time: 07/12/15  4:14 PM  Result Value Ref Range   Glucose-Capillary 114 (H) 65 - 99 mg/dL   Comment 1 Notify RN   Glucose, capillary     Status: Abnormal   Collection Time: 07/12/15  8:21 PM  Result Value Ref Range   Glucose-Capillary 123 (H) 65 - 99 mg/dL   Comment 1 Notify RN    Comment 2 Document in Chart   Glucose, capillary     Status: Abnormal   Collection Time: 07/13/15 12:21 AM  Result Value Ref Range   Glucose-Capillary 124 (H) 65 - 99 mg/dL   Comment 1 Notify RN    Comment 2 Document in Chart   Glucose, capillary     Status: Abnormal   Collection Time: 07/13/15  4:24 AM  Result Value Ref Range   Glucose-Capillary 102 (H) 65 - 99 mg/dL  Basic metabolic panel     Status: Abnormal   Collection Time: 07/13/15  5:33 AM  Result  Value Ref Range   Sodium 134 (L) 135 - 145 mmol/L   Potassium 6.9 (HH) 3.5 - 5.1 mmol/L    Comment: DELTA CHECK NOTED NO VISIBLE HEMOLYSIS CRITICAL RESULT CALLED TO, READ BACK BY AND VERIFIED WITH: Gilmer Mor [id number] [facility]    Chloride 109 101 - 111 mmol/L   CO2 18 (L) 22 - 32 mmol/L   Glucose, Bld 93 65 - 99 mg/dL   BUN 16 6 - 20 mg/dL   Creatinine, Ser 1.03 (H) 0.44 - 1.00 mg/dL   Calcium 7.2 (L) 8.9 - 10.3 mg/dL   GFR calc non Af Amer 54 (L) >60 mL/min   GFR calc Af Amer >60 >60 mL/min    Comment: (NOTE) The eGFR has been calculated using the CKD EPI equation. This calculation has not been validated in all clinical situations. eGFR's persistently <60 mL/min signify possible Chronic Kidney Disease.    Anion gap 7 5 - 15  CBC     Status: Abnormal   Collection Time: 07/13/15  5:33 AM  Result Value Ref Range   WBC 11.5 (H) 4.0 - 10.5 K/uL   RBC 3.24 (L) 3.87 - 5.11 MIL/uL    Hemoglobin 8.4 (L) 12.0 - 15.0 g/dL   HCT 27.7 (L) 36.0 - 46.0 %   MCV 85.5 78.0 - 100.0 fL   MCH 25.9 (L) 26.0 - 34.0 pg   MCHC 30.3 30.0 - 36.0 g/dL   RDW 18.0 (H) 11.5 - 15.5 %   Platelets 222 150 - 400 K/uL  Magnesium     Status: Abnormal   Collection Time: 07/13/15  5:33 AM  Result Value Ref Range   Magnesium 1.6 (L) 1.7 - 2.4 mg/dL  Potassium     Status: Abnormal   Collection Time: 07/13/15  7:48 AM  Result Value Ref Range   Potassium 5.4 (H) 3.5 - 5.1 mmol/L    Comment: DELTA CHECK NOTED NO VISIBLE HEMOLYSIS   Glucose, capillary     Status: Abnormal   Collection Time: 07/13/15  8:34 AM  Result Value Ref Range   Glucose-Capillary 110 (H) 65 - 99 mg/dL   Comment 1 Notify RN   Glucose, capillary     Status: Abnormal   Collection Time: 07/13/15 11:57 AM  Result Value Ref Range   Glucose-Capillary 109 (H) 65 - 99 mg/dL  Glucose, capillary     Status: Abnormal   Collection Time: 07/13/15  4:53 PM  Result Value Ref Range   Glucose-Capillary 164 (H) 65 - 99 mg/dL  Glucose, capillary     Status: Abnormal   Collection Time: 07/13/15  8:13 PM  Result Value Ref Range   Glucose-Capillary 163 (H) 65 - 99 mg/dL  Glucose, capillary     Status: Abnormal   Collection Time: 07/14/15 12:05 AM  Result Value Ref Range   Glucose-Capillary 156 (H) 65 - 99 mg/dL  Basic metabolic panel     Status: Abnormal   Collection Time: 07/14/15  4:30 AM  Result Value Ref Range   Sodium 138 135 - 145 mmol/L   Potassium 4.0 3.5 - 5.1 mmol/L    Comment: DELTA CHECK NOTED NO VISIBLE HEMOLYSIS    Chloride 109 101 - 111 mmol/L   CO2 20 (L) 22 - 32 mmol/L   Glucose, Bld 163 (H) 65 - 99 mg/dL   BUN 15 6 - 20 mg/dL   Creatinine, Ser 0.88 0.44 - 1.00 mg/dL   Calcium 8.2 (L) 8.9 - 10.3 mg/dL   GFR  calc non Af Amer >60 >60 mL/min   GFR calc Af Amer >60 >60 mL/min    Comment: (NOTE) The eGFR has been calculated using the CKD EPI equation. This calculation has not been validated in all clinical  situations. eGFR's persistently <60 mL/min signify possible Chronic Kidney Disease.    Anion gap 9 5 - 15  CBC     Status: Abnormal   Collection Time: 07/14/15  4:30 AM  Result Value Ref Range   WBC 10.7 (H) 4.0 - 10.5 K/uL   RBC 3.87 3.87 - 5.11 MIL/uL   Hemoglobin 9.7 (L) 12.0 - 15.0 g/dL   HCT 32.2 (L) 36.0 - 46.0 %   MCV 83.2 78.0 - 100.0 fL   MCH 25.1 (L) 26.0 - 34.0 pg   MCHC 30.1 30.0 - 36.0 g/dL   RDW 17.9 (H) 11.5 - 15.5 %   Platelets 302 150 - 400 K/uL  Glucose, capillary     Status: Abnormal   Collection Time: 07/14/15  4:30 AM  Result Value Ref Range   Glucose-Capillary 157 (H) 65 - 99 mg/dL    Imaging / Studies: No results found.  Medications / Allergies:  Scheduled Meds: . antiseptic oral rinse  7 mL Mouth Rinse BID  . docusate sodium  100 mg Oral BID  . enoxaparin (LOVENOX) injection  40 mg Subcutaneous Q24H  . escitalopram  10 mg Oral Daily  . insulin aspart  0-15 Units Subcutaneous Q4H  . levothyroxine  112 mcg Oral QAC breakfast  . metoprolol tartrate  25 mg Oral BID  . pantoprazole  40 mg Oral Daily  . sodium chloride flush  3 mL Intravenous Q12H   Continuous Infusions: . lactated ringers 10 mL/hr at 07/10/15 1050   PRN Meds:.acetaminophen, ALPRAZolam, HYDROcodone-acetaminophen, methocarbamol (ROBAXIN)  IV, morphine injection, sodium chloride flush  Antibiotics: Anti-infectives    None       Assessment/Plan SBO due to tumor POD#4 exploratory laparotomy, SBR---Dr. Grandville Silos -dc ngt and advance to fulls, add PO pain meds -mobilize -await pathology, will call son with results FEN-hyperkalemia, resolved.  SLIV VTE prophylaxis-SCD/lovenox Dispo-suspect she will be ready surgically 24-48h   Erby Pian, [facility] Pager [phone](7A-4:30P) For consults and floor pages call [phone](7A-4:30P)  07/14/2015 7:13 AM

## 2015-07-14 NOTE — Care Management Important Message (Signed)
Important Message  Patient Details  Name: Monica Morrison MRN: [id number] Date of Birth: [birth year]   Medicare Important Message Given:  Yes    Loann Quill 07/14/2015, 2:25 PM

## 2015-07-14 NOTE — Clinical Social Work Note (Signed)
Clinical Social Worker presented bed offers to patient's son, Christean Grief. Patient and son chooses bed at [facility]. Pt's son prefers [facility] however facility has declined making an offer.   CSW remains available as needed.   Glendon Axe, MSW, LCSWA [phone] 07/14/2015 12:39 PM

## 2015-07-14 NOTE — Progress Notes (Signed)
Patient ID: Monica Morrison, female   DOB: [birth year], 70 y.o.   MRN: [id number]  PROGRESS NOTE    Monica Morrison  [id number] DOB: Mar 24, 1945 DOA: 07/10/2015  PCP: Gara Kroner, MD   Brief Narrative:  70 year old female with past medical history of breast cancer, diabetes, hypertension, dyslipidemia who presented to [facility] 07/10/2015 with nausea, vomiting and abdominal pain. She was found to have small bowel obstruction. She underwent exploratory laparotomy and small bowel resection 07/10/2015. Pathology of the SB resection unfortunately consistent with metastatic carcinoma. Informed Dr. Lindi Adie of those findings. Appreciate Dr. Lindi Adie setting up outpatient follow-up appointment in next week or so.   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 Please note patient was found to have suspected metastatic adenocarcinoma intraoperatively which was thought to be causing the obstruction - She has NG tube however it was clamped yesterday 07/13/2015. She did not even a lot, she did not realize that she can have clear liquids while she has NG tube in. She was however sipping water. Reports no vomiting. We will see with surgery if NG tube can be removed today. - Informed Dr. 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 - Currently on sliding scale insulin only but at home she takes Actos and glipizide - CBGs in past 24 hours: 156, 157, 145  Dyslipidemia associated with type 2 diabetes mellitus -  We will resume statin therapy once tolerates solid food  Depression - Continue Lexapro  Essential hypertension - Continue metoprolol 25 mg twice daily  Hypothyroidism - Continue Synthroid      DVT prophylaxis: Lovenox subcutaneous Code Status: full code  Family Communication: No family at the bedside Disposition Plan: Home once cleared by surgery   Consultants:   Surgery   Procedures:   NG tube, clamped 5/22  Antimicrobials:   None    Subjective: Says she feels better.  Objective: Filed Vitals:   07/13/15 1528 07/13/15 2131 07/14/15 0517 07/14/15 0549  BP: 156/85 133/76 179/76 142/68  Pulse: 80 89 87   Temp: 98.2 F (36.8 C) 98.5 F (36.9 C) 98.3 F (36.8 C)   TempSrc: Oral Oral Oral   Resp: 20 18 18    Height:      Weight:      SpO2: 97% 98% 97%     Intake/Output Summary (Last 24 hours) at 07/14/15 0713 Last data filed at 07/14/15 0523  Gross per 24 hour  Intake   1329 ml  Output    875 ml  Net    454 ml   Filed Weights   07/10/15 0003  Weight: 75.751 kg (167 lb)    Examination:  General exam: ApIn no acute distress  Respiratory system: No wheezing or rhonchi Cardiovascular system:  S1 & S2 Appreciated, rate controlled  Gastrointestinal system:(+) BS, non distended, not tender  Central nervous system:  No focal neurological deficits. Extremities: Trace pitting edema, pulses palpable  Skin: skin warm, dry Psychiatry: normal mood and affect, no restlessness or agitation    Data Reviewed: I have personally reviewed following labs and imaging studies  CBC:  Recent Labs Lab 07/10/15 0018 07/10/15 0541 07/11/15 0447 07/12/15 0535 07/13/15 0533  07/14/15 0430  WBC 9.0 7.6 12.3* 13.4* 11.5* 10.7*  NEUTROABS 7.3  --   --  11.9*  --   --   HGB 11.1* 9.8* 10.8* 9.7* 8.4* 9.7*  HCT 37.0 33.0* 36.6 32.7* 27.7* 32.2*  MCV 85.5 84.0 85.5 85.6 85.5 83.2  PLT 346 285 249 265 222 [id number]   Basic Metabolic Panel:  Recent Labs Lab 07/10/15 0541 07/11/15 0447 07/12/15 0535 07/13/15 0533 07/13/15 0748 07/14/15 0430  NA 144 143 140 134*  --  138  K 2.7* 4.6 4.3 6.9* 5.4* 4.0  CL 106 108 106 109  --  109  CO2 26 19* 24 18*  --  20*  GLUCOSE 190* 122* 117* 93  --  163*  BUN 14 19 18 16   --  15  CREATININE 1.20* 1.35* 1.27* 1.03*  --  0.88  CALCIUM 8.9 8.4* 7.9* 7.2*  --  8.2*  MG 1.5* 1.4* 1.4* 1.6*  --   --    GFR: Estimated Creatinine Clearance: 56.7 mL/min (by C-G formula based on Cr of 0.88). Liver Function Tests:  Recent Labs Lab 07/10/15 0018 07/12/15 0535  AST 19 20  ALT 48 17  ALKPHOS 252* 152*  BILITOT 0.6 0.5  PROT 6.8 5.4*  ALBUMIN 2.9* 2.0*    Recent Labs Lab 07/10/15 0018  LIPASE 30   No results for input(s): AMMONIA in the last 168 hours. Coagulation Profile: No results for input(s): INR, PROTIME in the last 168 hours. Cardiac Enzymes: No results for input(s): CKTOTAL, CKMB, CKMBINDEX, TROPONINI in the last 168 hours. BNP (last 3 results) No results for input(s): PROBNP in the last 8760 hours. HbA1C: No results for input(s): HGBA1C in the last 72 hours. CBG:  Recent Labs Lab 07/13/15 1157 07/13/15 1653 07/13/15 2013 07/14/15 0005 07/14/15 0430  GLUCAP 109* 164* 163* 156* 157*   Lipid Profile: No results for input(s): CHOL, HDL, LDLCALC, TRIG, CHOLHDL, LDLDIRECT in the last 72 hours. Thyroid Function Tests: No results for input(s): TSH, T4TOTAL, FREET4, T3FREE, THYROIDAB in the last 72 hours. Anemia Panel: No results for input(s): VITAMINB12, FOLATE, FERRITIN, TIBC, IRON, RETICCTPCT in the last 72 hours. Urine analysis:    Component Value Date/Time   COLORURINE YELLOW 07/10/2015 0018    APPEARANCEUR CLEAR 07/10/2015 0018   LABSPEC 1.019 07/10/2015 0018   PHURINE 5.5 07/10/2015 0018   GLUCOSEU NEGATIVE 07/10/2015 0018   HGBUR NEGATIVE 07/10/2015 0018   BILIRUBINUR NEGATIVE 07/10/2015 0018   KETONESUR 15* 07/10/2015 0018   PROTEINUR NEGATIVE 07/10/2015 0018   NITRITE NEGATIVE 07/10/2015 0018   LEUKOCYTESUR NEGATIVE 07/10/2015 0018   Sepsis Labs: @LABRCNTIP (procalcitonin:4,lacticidven:4)   Recent Results (from the past 240 hour(s))  Surgical pcr screen     Status: None   Collection Time: 07/10/15  5:17 AM  Result Value Ref Range Status   MRSA, PCR NEGATIVE NEGATIVE Final   Staphylococcus aureus NEGATIVE NEGATIVE Final    Comment:        The Xpert SA Assay (FDA approved for NASAL specimens in patients over 41 years of age), is  one component of a comprehensive surveillance program.  Test performance has been validated by [facility] for patients greater than or equal to 31 year old. It is not intended to diagnose infection nor to guide or monitor treatment.       Radiology Studies: Dg Abd Acute W/chest 07/10/2015  Small bowel obstruction.    Scheduled Meds: . antiseptic oral rinse  7 mL Mouth Rinse BID  . docusate sodium  100 mg Oral BID  . enoxaparin (LOVENOX) injection  40 mg Subcutaneous Q24H  . escitalopram  10 mg Oral Daily  . insulin aspart  0-15 Units Subcutaneous Q4H  . levothyroxine  112 mcg Oral QAC breakfast  . metoprolol tartrate  25 mg Oral BID  . pantoprazole  40 mg Oral Daily  . sodium chloride flush  3 mL Intravenous Q12H   Continuous Infusions: . lactated ringers 10 mL/hr at 07/10/15 1050     LOS: 4 days    Time spent: 25 minutes  Greater than 50% of the time spent on counseling and coordinating the care.   Leisa Lenz, MD [facility] Pager [phone]  If 7PM-7AM, please contact night-coverage [email or web address] Password [facility] 07/14/2015, 7:13 AM

## 2015-07-14 NOTE — Progress Notes (Signed)
MD paged due to patient having four loose bowels movements. Awaiting a return call. Will continue to monitor.

## 2015-07-15 DIAGNOSIS — I10 Essential (primary) hypertension: Secondary | ICD-10-CM

## 2015-07-15 DIAGNOSIS — R197 Diarrhea, unspecified: Secondary | ICD-10-CM

## 2015-07-15 DIAGNOSIS — F329 Major depressive disorder, single episode, unspecified: Secondary | ICD-10-CM

## 2015-07-15 DIAGNOSIS — R109 Unspecified abdominal pain: Secondary | ICD-10-CM

## 2015-07-15 DIAGNOSIS — C50912 Malignant neoplasm of unspecified site of left female breast: Secondary | ICD-10-CM

## 2015-07-15 DIAGNOSIS — C787 Secondary malignant neoplasm of liver and intrahepatic bile duct: Secondary | ICD-10-CM

## 2015-07-15 LAB — BASIC METABOLIC PANEL
Anion gap: 7 (ref 5–15)
BUN: 12 mg/dL (ref 6–20)
CHLORIDE: 109 mmol/L (ref 101–111)
CO2: 23 mmol/L (ref 22–32)
CREATININE: 0.93 mg/dL (ref 0.44–1.00)
Calcium: 8.2 mg/dL — ABNORMAL LOW (ref 8.9–10.3)
GFR calc Af Amer: >60 mL/min (ref >60)
GLUCOSE: 151 mg/dL — AB (ref 65–99)
POTASSIUM: 3.7 mmol/L (ref 3.5–5.1)
SODIUM: 139 mmol/L (ref 135–145)

## 2015-07-15 LAB — CBC
HCT: 30.2 % — ABNORMAL LOW (ref 36.0–46.0)
Hemoglobin: 9.1 g/dL — ABNORMAL LOW (ref 12.0–15.0)
MCH: 25.1 pg — ABNORMAL LOW (ref 26.0–34.0)
MCHC: 30.1 g/dL (ref 30.0–36.0)
MCV: 83.2 fL (ref 78.0–100.0)
PLATELETS: 292 10*3/uL (ref 150–400)
RBC: 3.63 MIL/uL — ABNORMAL LOW (ref 3.87–5.11)
RDW: 17.9 % — AB (ref 11.5–15.5)
WBC: 6.9 10*3/uL (ref 4.0–10.5)

## 2015-07-15 LAB — GLUCOSE, CAPILLARY
GLUCOSE-CAPILLARY: 140 mg/dL — AB (ref 65–99)
GLUCOSE-CAPILLARY: 194 mg/dL — AB (ref 65–99)
Glucose-Capillary: 123 mg/dL — ABNORMAL HIGH (ref 65–99)
Glucose-Capillary: 151 mg/dL — ABNORMAL HIGH (ref 65–99)
Glucose-Capillary: 156 mg/dL — ABNORMAL HIGH (ref 65–99)
Glucose-Capillary: 171 mg/dL — ABNORMAL HIGH (ref 65–99)

## 2015-07-15 MED ORDER — ALPRAZOLAM 0.25 MG PO TABS: 0.2500 mg | ORAL_TABLET | Freq: Every evening | ORAL | Status: DC | PRN

## 2015-07-15 MED ORDER — HYDROCODONE-ACETAMINOPHEN 5-325 MG PO TABS: 1.0000 | ORAL_TABLET | ORAL | Status: DC | PRN

## 2015-07-15 NOTE — Progress Notes (Signed)
RN updated in morning progression that the plan is for the patient to go to [facility] today.RN called social work after the discharge order was placed to let her know that the patient would need ambulance transport. Per social work, she had to fax over a copy of the discharge summary and that she would call me before transport was called. RN called to patient's room due to son receiving a call that the bed was no longer available.

## 2015-07-15 NOTE — Discharge Summary (Signed)
Physician Discharge Summary  Monica Morrison [id number] DOB: 01/09/1946 DOA: 07/10/2015  PCP: Gara Kroner, MD  Admit date: 07/10/2015 Discharge date: 07/15/2015  Recommendations for Outpatient Follow-up:  1. Oncology will set up follow up appointment for the pt in next week or so for follow up on recent path findings of metastatic carcinoma.   Discharge Diagnoses:  Principal Problem:   SBO (small bowel obstruction) (HCC) Active Problems:   Breast cancer of lower-outer quadrant of left female breast (HCC)   Diabetes mellitus with complication (HCC)   CKD (chronic kidney disease) stage 3, GFR 30-59 ml/min    Discharge Condition: stable   Diet recommendation: as tolerated   History of present illness:  70 year old female with past medical history of breast cancer, diabetes, hypertension, dyslipidemia who presented to [facility] 07/10/2015 with nausea, vomiting and abdominal pain. She was found to have small bowel obstruction. She underwent exploratory laparotomy and small bowel resection 07/10/2015. Pathology of the SB resection unfortunately consistent with metastatic carcinoma. Informed Dr. Lindi Adie of those findings. Appreciate Dr. Lindi Adie setting up outpatient follow-up appointment in next week or so.   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 Please note patient was found to have suspected metastatic adenocarcinoma intraoperatively which was thought to be causing the obstruction - Tolerates regular diet so far - NG tube out 5/23 - Okay per surgery for discharge  - Informed Dr. 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 - Continue home meds  Dyslipidemia associated with type 2 diabetes mellitus - Continue statin therapy   Depression - Continue Lexapro  Essential hypertension - Continue metoprolol 25 mg twice daily  Hypothyroidism - Continue Synthroid    DVT prophylaxis: Lovenox subcutaneous Code Status: full code  Family Communication: No family at the bedside; left message on VM about discharge plan     Consultants:   Surgery  Procedures:   NG tube, clamped 5/22  Antimicrobials:   None    Signed:  Leisa Lenz, MD  [facility] 07/15/2015, 1:46 PM  Pager #: [phone]  Time spent in minutes: more than 30 minutes    Discharge Exam: Filed Vitals:   07/14/15 2148 07/15/15 0532  BP: 144/64 100/76  Pulse: 98 79  Temp: 98.4 F (36.9 C) 98.3 F (36.8 C)  Resp: 17 18   Filed Vitals:   07/14/15 0549 07/14/15 1355 07/14/15 2148 07/15/15 0532  BP: 142/68 142/75 144/64 100/76  Pulse:  79 98 79  Temp:  98 F (36.7 C) 98.4 F (36.9 C) 98.3 F (36.8 C)  TempSrc:  Oral Oral Oral  Resp:  18 17 18   Height:      Weight:      SpO2:  98% 95% 97%    General:  Pt is alert, follows commands appropriately, not in acute distress Cardiovascular: Regular rate and rhythm, S1/S2 + Respiratory: Clear to auscultation bilaterally, no wheezing, no crackles, no rhonchi Abdominal: Soft, non tender, non distended, bowel sounds +, no guarding Extremities: no edema, no cyanosis, pulses palpable bilaterally DP and PT Neuro: Grossly  nonfocal  Discharge Instructions  Discharge Instructions    Call MD for:  persistant dizziness or light-headedness    Complete by:  As directed      Call MD for:  persistant nausea and vomiting    Complete by:  As directed      Call MD for:  redness, tenderness, or signs of infection (pain, swelling, redness, odor or green/yellow discharge around incision site)    Complete by:  As directed      Call MD for:  temperature >100.4    Complete by:  As directed      Diet - low sodium heart healthy    Complete by:  As directed      Increase activity slowly    Complete by:  As directed             Medication List    TAKE these medications        alendronate 70 MG tablet  Commonly known as:  FOSAMAX  Take 70 mg by mouth every 7 (seven) days. On Sunday     ALPRAZolam 0.25 MG tablet  Commonly known as:  XANAX  Take 0.25 mg by mouth at bedtime as needed.     atorvastatin 20 MG tablet  Commonly known as:  LIPITOR  Take 20 mg by mouth daily.     bismuth subsalicylate [id number] suspension  Commonly known as:  PEPTO BISMOL  USE AS DIRECTED FOR LOOSE STOOLS.     CALCIUM 500 PO  Take 500 mg by mouth daily.     escitalopram 10 MG tablet  Commonly known as:  LEXAPRO  Take 10 mg by mouth daily.     glipiZIDE 5 MG tablet  Commonly known as:  GLUCOTROL  Take 10 mg by mouth daily.     levothyroxine 112 MCG tablet  Commonly known as:  SYNTHROID, LEVOTHROID  Take 112 mcg by mouth daily.     metoprolol tartrate 25 MG tablet  Commonly known as:  LOPRESSOR  Take 1 tablet (25 mg total) by mouth 2 (two) times daily.     multivitamin with minerals Tabs tablet  Take 1 tablet by mouth daily.     omeprazole 20 MG capsule  Commonly known as:  PRILOSEC  Take 20 mg by mouth daily.     ONE TOUCH ULTRA TEST test strip  Generic drug:  glucose blood     pioglitazone 15 MG tablet  Commonly known as:  ACTOS  Take 15 mg by mouth daily.     saccharomyces boulardii 250 MG capsule   Commonly known as:  FLORASTOR  YOU CAN BUY THIS AT ANY DRUG STORE, AND FOLLOW DIRECTIONS ON PACKAGE.  USE FOR THE NEXT 2-3 WEEKS TILL YOUR STOOLS ARE NORMAL.           Follow-up Information    Follow up with Gara Kroner, MD. Schedule an appointment as soon as possible for a visit in 2 weeks.   Specialty:  Family Medicine   Why:  Follow up appt after recent hospitalization   Contact information:   [address] [place] [address] [phone]        The  results of significant diagnostics from this hospitalization (including imaging, microbiology, ancillary and laboratory) are listed below for reference.    Significant Diagnostic Studies: Dg Abd 1 View  06/15/2015  CLINICAL DATA:  Vomiting brown liquid X 3 days, diarrhea since she got the emergency room today, Hx of acid reflux, hiatal hernia, EXAM: ABDOMEN - 1 VIEW COMPARISON:  06/15/2015 CT scan at 07:41 FINDINGS: Intravenous contrast is seen within the renal collecting systems ureters and bladder. Mildly dilated loops of small bowel again identified with some gas into the proximal colon and decompressed more distal colon. IMPRESSION: Bowel gas pattern nonspecific radiographically but does again demonstrates some dilated loops of small bowel with the possibility again of small-bowel obstruction. Electronically Signed   By: Skipper Cliche M.D.   On: 06/15/2015 15:32   Ct Abdomen Pelvis W Contrast  06/29/2015  CLINICAL DATA:  Abdominal pain and vomiting today. Recent colitis and small bowel obstruction. Left breast carcinoma. EXAM: CT ABDOMEN AND PELVIS WITH CONTRAST TECHNIQUE: Multidetector CT imaging of the abdomen and pelvis was performed using the standard protocol following bolus administration of intravenous contrast. CONTRAST:  73mL ISOVUE-300 IOPAMIDOL (ISOVUE-300) INJECTION 61% COMPARISON:  06/15/2015 and 02/01/2007 FINDINGS: Lower chest:  No acute findings. Hepatobiliary: Poorly defined hypovascular mass in  the posterior right hepatic lobe measures 3.0 cm on image 15/series 2 and has not significantly changed compared to most recent exam although it is new compared to 2008 exam. This is suspicious for a liver metastasis. No other definite liver masses are identified. Tiny calcified gallstones are again seen. Gallbladder is mildly distended but there is no evidence of wall thickening or pericholecystic inflammatory changes. Pancreas: No mass or other significant abnormality identified. Spleen: Within normal limits in size and appearance. Adrenals/Urinary Tract: Bilateral adrenal masses are again seen measuring 3.5 cm on the right and 3.2 cm on the left. These are unchanged since most recent exam but are new since 2008 and consistent with adrenal metastases. Tiny left renal cysts are again noted however there is no evidence of renal masses. Moderate right hydronephrosis and ureterectasis shows no significant change. Asymmetric wall thickening is again seen along the right lateral and posterior bladder walls. This could be due to neoplasm or cystitis. Stomach/Bowel: Small to moderate hiatal hernia seen. Multiple moderately dilated small bowel loops are again seen containing air-fluid levels. There is a transition point in the anterior lower abdomen were there is a thickened small bowel loop as well as small less than 1 cm lymph nodes in the adjacent small bowel mesentery. Colonic diverticulosis also noted, without of diverticulitis. Vascular/Lymphatic: Bilateral iliac and retroperitoneal surgical clips seen. No pathologically enlarged lymph nodes identified.No evidence of abdominal aortic aneurysm. Reproductive: Prior hysterectomy noted. Adnexal regions are unremarkable in appearance. Other: None. Musculoskeletal:  No suspicious bone lesions identified. IMPRESSION: Stable appearance of mid small bowel obstruction with transition point in the anterior lower abdomen. Small bowel wall thickening and adjacent sub-cm  mesenteric lymph nodes at the transition point may be secondary to metastatic disease, although differential diagnosis also includes ischemic and inflammatory etiologies as well as adhesion. Stable moderate right hydronephrosis and asymmetric bladder wall thickening. This may be due to bladder carcinoma or cystitis. 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 No significant change in 3 cm hypovascular right hepatic lobe mass, suspicious for hepatic metastasis. Cholelithiasis.  No radiographic evidence of cholecystitis. Small to moderate hiatal hernia. Electronically Signed   By: Sharrie Rothman.D.  On: 06/29/2015 21:17   Dg Abd 2 Views  07/01/2015  CLINICAL DATA:  Abdominal pain EXAM: ABDOMEN - 2 VIEW COMPARISON:  06/30/2015 FINDINGS: NG tube remains coiled in the stomach. There is contrast throughout the colon. Postop changes are noted. No disproportionate dilatation of bowel. There is no free intraperitoneal gas. Fatty tissue is seen surrounding the right lobe of the liver. There are nonspecific air-fluid levels in non-dilated bowel. IMPRESSION: Resolved small bowel obstruction pattern. No free intraperitoneal gas. Electronically Signed   By: Marybelle Killings M.D.   On: 07/01/2015 09:47   Dg Abd 2 Views  06/16/2015  CLINICAL DATA:  Small bowel obstruction. EXAM: ABDOMEN - 2 VIEW COMPARISON:  June 15, 2015. FINDINGS: The bowel gas pattern is normal. Residual contrast is noted in the colon. Surgical clips are noted in the pelvis and abdomen. No abnormal calcifications are noted. IMPRESSION: No evidence of bowel obstruction or ileus. Electronically Signed   By: Marijo Conception, M.D.   On: 06/16/2015 08:53   Dg Abd Acute W/chest  07/10/2015  CLINICAL DATA:  Increased abdominal pain and vomiting for 1 day, small bowel obstruction, hypertension, diabetes mellitus, breast cancer EXAM: DG ABDOMEN ACUTE W/ 1V CHEST COMPARISON:  Abdominal radiographs 07/01/2015, chest  radiograph 01/01/2009 FINDINGS: Normal heart size, mediastinal contours, and pulmonary vascularity. Minimal central peribronchial thickening. No acute infiltrate, pleural effusion or pneumothorax. Post LEFT breast surgery and axillary node dissection. Dilated small bowel loops with paucity of colonic gas consistent with small bowel obstruction. No bowel wall thickening or free intraperitoneal air. Surgical clips in pelvis bilaterally and in the RIGHT paraspinal region as well as anterior to L2 and L3. Bones demineralized with scattered degenerative changes of the spine. IMPRESSION: Small bowel obstruction. Electronically Signed   By: Lavonia Dana M.D.   On: 07/10/2015 01:18   Dg Abd Portable 1v-small Bowel Obstruction Protocol-initial, 8 Hr Delay  06/30/2015  CLINICAL DATA:  Small bowel obstruction.  8 hour delayed film. EXAM: PORTABLE ABDOMEN - 1 VIEW COMPARISON:  Abdominal radiograph prior today and CT on 06/29/2015 FINDINGS: Nasogastric tube is again seen within the stomach. Oral contrast is seen within the colon from prior CT. In addition, there is increased oral contrast material in mildly dilated small bowel loops in the right and mid abdomen. This is suspicious for a partial small bowel obstruction. IMPRESSION: Findings suspicious for partial small bowel obstruction. Nasogastric tube in stomach. Electronically Signed   By: Earle Gell M.D.   On: 06/30/2015 19:45   Dg Abd Portable 1v-small Bowel Protocol-position Verification  06/30/2015  CLINICAL DATA:  NG tube placement EXAM: PORTABLE ABDOMEN - 1 VIEW COMPARISON:  06/16/2015 FINDINGS: NG tube loops in the fundus of the stomach with the tip in the distal stomach. Single prominent small bowel loop in the left lower abdomen. Oral contrast material noted within decompressed colon. Surgical clips in the lower abdomen and pelvis. IMPRESSION: NG tube tip in the distal stomach. Single prominent loop of small bowel in the left lower abdomen. Electronically Signed    By: Rolm Baptise M.D.   On: 06/30/2015 10:56    Microbiology: Recent Results (from the past 240 hour(s))  Surgical pcr screen     Status: None   Collection Time: 07/10/15  5:17 AM  Result Value Ref Range Status   MRSA, PCR NEGATIVE NEGATIVE Final   Staphylococcus aureus NEGATIVE NEGATIVE Final    Comment:        The Xpert SA Assay (FDA approved for NASAL  specimens in patients over 75 years of age), is one component of a comprehensive surveillance program.  Test performance has been validated by [facility] for patients greater than or equal to 76 year old. It is not intended to diagnose infection nor to guide or monitor treatment.      Labs: Basic Metabolic Panel:  Recent Labs Lab 07/10/15 0541 07/11/15 0447 07/12/15 0535 07/13/15 0533 07/13/15 0748 07/14/15 0430 07/15/15 0501  NA 144 143 140 134*  --  138 139  K 2.7* 4.6 4.3 6.9* 5.4* 4.0 3.7  CL 106 108 106 109  --  109 109  CO2 26 19* 24 18*  --  20* 23  GLUCOSE 190* 122* 117* 93  --  163* 151*  BUN 14 19 18 16   --  15 12  CREATININE 1.20* 1.35* 1.27* 1.03*  --  0.88 0.93  CALCIUM 8.9 8.4* 7.9* 7.2*  --  8.2* 8.2*  MG 1.5* 1.4* 1.4* 1.6*  --   --   --    Liver Function Tests:  Recent Labs Lab 07/10/15 0018 07/12/15 0535  AST 19 20  ALT 48 17  ALKPHOS 252* 152*  BILITOT 0.6 0.5  PROT 6.8 5.4*  ALBUMIN 2.9* 2.0*    Recent Labs Lab 07/10/15 0018  LIPASE 30   No results for input(s): AMMONIA in the last 168 hours. CBC:  Recent Labs Lab 07/10/15 0018  07/11/15 0447 07/12/15 0535 07/13/15 0533 07/14/15 0430 07/15/15 0501  WBC 9.0  < > 12.3* 13.4* 11.5* 10.7* 6.9  NEUTROABS 7.3  --   --  11.9*  --   --   --   HGB 11.1*  < > 10.8* 9.7* 8.4* 9.7* 9.1*  HCT 37.0  < > 36.6 32.7* 27.7* 32.2* 30.2*  MCV 85.5  < > 85.5 85.6 85.5 83.2 83.2  PLT 346  < > 249 265 222 302 292  < > = values in this interval not displayed. Cardiac Enzymes: No results for input(s): CKTOTAL, CKMB, CKMBINDEX,  TROPONINI in the last 168 hours. BNP: BNP (last 3 results) No results for input(s): BNP in the last 8760 hours.  ProBNP (last 3 results) No results for input(s): PROBNP in the last 8760 hours.  CBG:  Recent Labs Lab 07/14/15 2020 07/15/15 0013 07/15/15 0405 07/15/15 0734 07/15/15 1138  GLUCAP 179* 151* 156* 140* 123*

## 2015-07-15 NOTE — Progress Notes (Signed)
RN made patient's MD aware that the bed was no longer available at [facility]. Per MD, she was going to call the social worker and speak with her. Awaiting a return call.

## 2015-07-15 NOTE — Consult Note (Signed)
   [facility] CM Inpatient Consult   07/15/2015  Monica Morrison 05/09/1945 [id number]  Patient screened for potential [place] Management services. Patient is eligible for [place] with her [employer], however the electronic medical record reveals patient's discharge plan is for a skilled nursing facility.  This is the patient's 3rd hospitalization in 6 months. Admitted with SBO (small bowel obstruction) with a HX of Breast cancer of lower-outer quadrant of left female breast, Diabetes mellitus with complication of CKD (chronic kidney disease) stage 3 per medical record. Met with the patient and her family who were awaiting discharge to a skilled facility.  Patient's plan is to live with family member when she gets out of rehab in a new apartment.   Spoke with them regarding [place] Management services when the patient returns home. Brochure and contact information was accepted.    For questions or consults,  please contact:   Natividad Brood, RN BSN [facility] Liaison  [phone] business mobile phone Toll free office [phone]

## 2015-07-15 NOTE — Clinical Social Work Note (Signed)
Patient to discharge to [facility] tomorrow. Insurance authorization started for [facility].   Raelyn Number, [id number]

## 2015-07-15 NOTE — Care Management Important Message (Signed)
Important Message  Patient Details  Name: Monica Morrison MRN: [id number] Date of Birth: 08-01-1945   Medicare Important Message Given:  Yes    Loann Quill 07/15/2015, 11:51 AM

## 2015-07-15 NOTE — Discharge Instructions (Addendum)
PLEASE REMOVE STAPLES ON 5/30 AND APPLY STERI STRIPS    ABDOMINAL SURGERY: POST OP INSTRUCTIONS  1. DIET: Follow a light bland diet the first 24 hours after arrival home, such as soup, liquids, crackers, etc.  Be sure to include lots of fluids daily.  Avoid fast food or heavy meals as your are more likely to get nauseated.  Eat a low fat the next few days after surgery.   2. Take your usually prescribed home medications unless otherwise directed. 3. PAIN CONTROL: a. Pain is best controlled by a usual combination of three different methods TOGETHER: i. Ice/Heat ii. Over the counter pain medication iii. Prescription pain medication b. Most patients will experience some swelling and bruising around the incisions.  Ice packs or heating pads (30-60 minutes up to 6 times a day) will help. Use ice for the first few days to help decrease swelling and bruising, then switch to heat to help relax tight/sore spots and speed recovery.  Some people prefer to use ice alone, heat alone, alternating between ice & heat.  Experiment to what works for you.  Swelling and bruising can take several weeks to resolve.   c. It is helpful to take an over-the-counter pain medication regularly for the first few weeks.  Choose one of the following that works best for you: i. Naproxen (Aleve, etc)  Two 220mg  tabs twice a day ii. Ibuprofen (Advil, etc) Three 200mg  tabs four times a day (every meal & bedtime) iii. Acetaminophen (Tylenol, etc) 500-650mg  four times a day (every meal & bedtime) d. A  prescription for pain medication (such as oxycodone, hydrocodone, etc) should be given to you upon discharge.  Take your pain medication as prescribed.  i. If you are having problems/concerns with the prescription medicine (does not control pain, nausea, vomiting, rash, itching, etc), please call us [phone] to see if we need to switch you to a different pain medicine that will work better for you and/or control your side effect  better. ii. If you need a refill on your pain medication, please contact your pharmacy.  They will contact our office to request authorization. Prescriptions will not be filled after 5 pm or on week-ends. 4. Avoid getting constipated.  Between the surgery and the pain medications, it is common to experience some constipation.  Increasing fluid intake and taking a fiber supplement (such as Metamucil, Citrucel, FiberCon, MiraLax, etc) 1-2 times a day regularly will usually help prevent this problem from occurring.  A mild laxative (prune juice, Milk of Magnesia, MiraLax, etc) should be taken according to package directions if there are no bowel movements after 48 hours.   5. Watch out for diarrhea.  If you have many loose bowel movements, simplify your diet to bland foods & liquids for a few days.  Stop any stool softeners and decrease your fiber supplement.  Switching to mild anti-diarrheal medications (Kayopectate, Pepto Bismol) can help.  If this worsens or does not improve, please call us. 6. Wash / shower every day.  You may shower over the incision / wound.  Avoid baths until the skin is fully healed.  Continue to shower over incision(s) after the dressing is off. 7. Remove your waterproof bandages 5 days after surgery.  You may leave the incision open to air.  You may replace a dressing/Band-Aid to cover the incision for comfort if you wish. 8. ACTIVITIES as tolerated:   a. You may resume regular (light) daily activities beginning the next day--such as daily  self-care, walking, climbing stairs--gradually increasing activities as tolerated.  If you can walk 30 minutes without difficulty, it is safe to try more intense activity such as jogging, treadmill, bicycling, low-impact aerobics, swimming, etc. b. Save the most intensive and strenuous activity for last such as sit-ups, heavy lifting, contact sports, etc  Refrain from any heavy lifting or straining until you are off narcotics for pain control.    c. DO NOT PUSH THROUGH PAIN.  Let pain be your guide: If it hurts to do something, don't do it.  Pain is your body warning you to avoid that activity for another week until the pain goes down. d. You may drive when you are no longer taking prescription pain medication, you can comfortably wear a seatbelt, and you can safely maneuver your car and apply brakes. e. Dennis Bast may have sexual intercourse when it is comfortable.  9. FOLLOW UP in our office a. Please call [facility] at [phone] to set up an appointment to see your surgeon in the office for a follow-up appointment approximately 1-2 weeks after your surgery. b. Make sure that you call for this appointment the day you arrive home to insure a convenient appointment time. 10. IF YOU HAVE DISABILITY OR FAMILY LEAVE FORMS, BRING THEM TO THE OFFICE FOR PROCESSING.  DO NOT GIVE THEM TO YOUR DOCTOR.   WHEN TO CALL us [phone]: 1. Poor pain control 2. Reactions / problems with new medications (rash/itching, nausea, etc)  3. Fever over 101.5 F (38.5 C) 4. Inability to urinate 5. Nausea and/or vomiting 6. Worsening swelling or bruising 7. Continued bleeding from incision. 8. Increased pain, redness, or drainage from the incision  The clinic staff is available to answer your questions during regular business hours (8:30am-5pm).  Please dont hesitate to call and ask to speak to one of our nurses for clinical concerns.   A surgeon from [facility] is always on call at the hospitals   If you have a medical emergency, go to the nearest emergency room or call 911.    [facility], [place], [place], [address], [place]  [id number] ? MAIN: [phone] ? TOLL FREE: [phone] ? FAX [phone] [email or web address]

## 2015-07-15 NOTE — Progress Notes (Signed)
Patient ID: Monica Morrison, female   DOB: March 27, 1945, 70 y.o.   MRN: [id number]     [facility]      [address] [place], [place] [address]    Phone: [phone] FAX: [phone]     Subjective: Having BMs. Tolerating liquids. Incontinent. No n/v. Pain controlled.   Objective:  Vital signs:  Filed Vitals:   07/14/15 0549 07/14/15 1355 07/14/15 2148 07/15/15 0532  BP: 142/68 142/75 144/64 100/76  Pulse:  79 98 79  Temp:  98 F (36.7 C) 98.4 F (36.9 C) 98.3 F (36.8 C)  TempSrc:  Oral Oral Oral  Resp:  18 17 18   Height:      Weight:      SpO2:  98% 95% 97%    Last BM Date: 07/09/15  Intake/Output   Yesterday:  05/23 0701 - 05/24 0700 In: -  Out: 200 [Urine:200] This shift: I/O last 3 completed shifts: In: 889 [P.O.:120; I.V.:869] Out: 700 [Urine:700]     Physical Exam: General: Pt awake/alert/oriented x4 in no acute distress Chest: cta. No chest wall pain w good excursion CV: Pulses intact. Regular rhythm Abdomen: Soft. Nondistended. Mildly tender at incisions only. Incision without erythema, wound edges are approximated. No evidence of peritonitis. No incarcerated hernias. Ext: SCDs BLE. No mjr edema. No cyanosis Skin: No petechiae / purpura    Problem List:   Principal Problem:   SBO (small bowel obstruction) (HCC) Active Problems:   Breast cancer of lower-outer quadrant of left female breast ([place])   Diabetes mellitus with complication (HCC)   CKD (chronic kidney disease) stage 3, GFR 30-59 ml/min    Results:   Labs: Results for orders placed or performed during the hospital encounter of 07/10/15 (from the past 48 hour(s))  Potassium     Status: Abnormal   Collection Time: 07/13/15  7:48 AM  Result Value Ref Range   Potassium 5.4 (H) 3.5 - 5.1 mmol/L    Comment: DELTA CHECK NOTED NO VISIBLE HEMOLYSIS   Glucose, capillary     Status: Abnormal   Collection Time: 07/13/15  8:34 AM   Result Value Ref Range   Glucose-Capillary 110 (H) 65 - 99 mg/dL   Comment 1 Notify RN   Glucose, capillary     Status: Abnormal   Collection Time: 07/13/15 11:57 AM  Result Value Ref Range   Glucose-Capillary 109 (H) 65 - 99 mg/dL  Glucose, capillary     Status: Abnormal   Collection Time: 07/13/15  4:53 PM  Result Value Ref Range   Glucose-Capillary 164 (H) 65 - 99 mg/dL  Glucose, capillary     Status: Abnormal   Collection Time: 07/13/15  8:13 PM  Result Value Ref Range   Glucose-Capillary 163 (H) 65 - 99 mg/dL  Glucose, capillary     Status: Abnormal   Collection Time: 07/14/15 12:05 AM  Result Value Ref Range   Glucose-Capillary 156 (H) 65 - 99 mg/dL  Basic metabolic panel     Status: Abnormal   Collection Time: 07/14/15  4:30 AM  Result Value Ref Range   Sodium 138 135 - 145 mmol/L   Potassium 4.0 3.5 - 5.1 mmol/L    Comment: DELTA CHECK NOTED NO VISIBLE HEMOLYSIS    Chloride 109 101 - 111 mmol/L   CO2 20 (L) 22 - 32 mmol/L   Glucose, Bld 163 (H) 65 - 99 mg/dL   BUN 15 6 - 20 mg/dL   Creatinine, Ser  0.88 0.44 - 1.00 mg/dL   Calcium 8.2 (L) 8.9 - 10.3 mg/dL   GFR calc non Af Amer >60 >60 mL/min   GFR calc Af Amer >60 >60 mL/min    Comment: (NOTE) The eGFR has been calculated using the CKD EPI equation. This calculation has not been validated in all clinical situations. eGFR's persistently <60 mL/min signify possible Chronic Kidney Disease.    Anion gap 9 5 - 15  CBC     Status: Abnormal   Collection Time: 07/14/15  4:30 AM  Result Value Ref Range   WBC 10.7 (H) 4.0 - 10.5 K/uL   RBC 3.87 3.87 - 5.11 MIL/uL   Hemoglobin 9.7 (L) 12.0 - 15.0 g/dL   HCT 32.2 (L) 36.0 - 46.0 %   MCV 83.2 78.0 - 100.0 fL   MCH 25.1 (L) 26.0 - 34.0 pg   MCHC 30.1 30.0 - 36.0 g/dL   RDW 17.9 (H) 11.5 - 15.5 %   Platelets 302 150 - 400 K/uL  Glucose, capillary     Status: Abnormal   Collection Time: 07/14/15  4:30 AM  Result Value Ref Range   Glucose-Capillary 157 (H) 65 - 99  mg/dL  Glucose, capillary     Status: Abnormal   Collection Time: 07/14/15  7:59 AM  Result Value Ref Range   Glucose-Capillary 145 (H) 65 - 99 mg/dL  Glucose, capillary     Status: Abnormal   Collection Time: 07/14/15  1:16 PM  Result Value Ref Range   Glucose-Capillary 209 (H) 65 - 99 mg/dL  Glucose, capillary     Status: Abnormal   Collection Time: 07/14/15  5:47 PM  Result Value Ref Range   Glucose-Capillary 201 (H) 65 - 99 mg/dL  Glucose, capillary     Status: Abnormal   Collection Time: 07/14/15  8:20 PM  Result Value Ref Range   Glucose-Capillary 179 (H) 65 - 99 mg/dL  Glucose, capillary     Status: Abnormal   Collection Time: 07/15/15 12:13 AM  Result Value Ref Range   Glucose-Capillary 151 (H) 65 - 99 mg/dL  Glucose, capillary     Status: Abnormal   Collection Time: 07/15/15  4:05 AM  Result Value Ref Range   Glucose-Capillary 156 (H) 65 - 99 mg/dL  Basic metabolic panel     Status: Abnormal   Collection Time: 07/15/15  5:01 AM  Result Value Ref Range   Sodium 139 135 - 145 mmol/L   Potassium 3.7 3.5 - 5.1 mmol/L   Chloride 109 101 - 111 mmol/L   CO2 23 22 - 32 mmol/L   Glucose, Bld 151 (H) 65 - 99 mg/dL   BUN 12 6 - 20 mg/dL   Creatinine, Ser 0.93 0.44 - 1.00 mg/dL   Calcium 8.2 (L) 8.9 - 10.3 mg/dL   GFR calc non Af Amer >60 >60 mL/min   GFR calc Af Amer >60 >60 mL/min    Comment: (NOTE) The eGFR has been calculated using the CKD EPI equation. This calculation has not been validated in all clinical situations. eGFR's persistently <60 mL/min signify possible Chronic Kidney Disease.    Anion gap 7 5 - 15  CBC     Status: Abnormal   Collection Time: 07/15/15  5:01 AM  Result Value Ref Range   WBC 6.9 4.0 - 10.5 K/uL   RBC 3.63 (L) 3.87 - 5.11 MIL/uL   Hemoglobin 9.1 (L) 12.0 - 15.0 g/dL   HCT 30.2 (L) 36.0 - 46.0 %  MCV 83.2 78.0 - 100.0 fL   MCH 25.1 (L) 26.0 - 34.0 pg   MCHC 30.1 30.0 - 36.0 g/dL   RDW 17.9 (H) 11.5 - 15.5 %   Platelets 292 150 - 400  K/uL  Glucose, capillary     Status: Abnormal   Collection Time: 07/15/15  7:34 AM  Result Value Ref Range   Glucose-Capillary 140 (H) 65 - 99 mg/dL    Imaging / Studies: No results found.  Medications / Allergies:  Scheduled Meds: . antiseptic oral rinse  7 mL Mouth Rinse BID  . docusate sodium  100 mg Oral BID  . enoxaparin (LOVENOX) injection  40 mg Subcutaneous Q24H  . escitalopram  10 mg Oral Daily  . insulin aspart  0-15 Units Subcutaneous Q4H  . levothyroxine  112 mcg Oral QAC breakfast  . metoprolol tartrate  25 mg Oral BID  . pantoprazole  40 mg Oral Daily  . sodium chloride flush  3 mL Intravenous Q12H   Continuous Infusions: . lactated ringers 10 mL/hr at 07/10/15 1050   PRN Meds:.acetaminophen, ALPRAZolam, HYDROcodone-acetaminophen, methocarbamol (ROBAXIN)  IV, morphine injection, sodium chloride flush  Antibiotics: Anti-infectives    None       Assessment/Plan SBO due to tumor, metastatic cancer, primary likely from breast.  POD#5 exploratory laparotomy, SBR---Dr. Grandville Silos -advance to soft diet  -mobilize -pathology discussed with the patient and her son on 5/23.  Will need to see her oncologist on OP basis.  -remove staples 5/30 FEN-no issues VTE prophylaxis-SCD/lovenox Dispo-stable for DC from a surgical standpoint    Erby Pian, [facility] Pager [phone](7A-4:30P) For consults and floor pages call [phone](7A-4:30P)  07/15/2015 7:41 AM

## 2015-07-15 NOTE — Progress Notes (Signed)
Physical Therapy Treatment Patient Details Name: Monica Morrison MRN: [id number] DOB: Sep 05, 1945 Today's Date: 07/15/2015    History of Present Illness 70 year old female with past medical history of breast cancer, diabetes, hypertension, dyslipidemia who presented to [facility] 07/10/2015 with nausea, vomiting and abdominal pain. She was found to have small bowel obstruction. She underwent exploratory laparotomy and small bowel resection 07/10/2015.    PT Comments    Pt performed increased mobility and activity, pt required increased cues for pursed lip breathing.  Pt 93% on RA with SHOB.  Pt educated to perform incentive spirometer.    Follow Up Recommendations        Equipment Recommendations  None recommended by PT    Recommendations for Other Services       Precautions / Restrictions Precautions Precautions: Fall Restrictions Weight Bearing Restrictions: No    Mobility  Bed Mobility               General bed mobility comments: Pt received in recliner on arrival.    Transfers Overall transfer level: Needs assistance Equipment used: Rolling walker (2 wheeled) Transfers: Sit to/from Stand Sit to Stand: Min guard;Mod assist         General transfer comment: Cues for hand placement, assist to boost from seated surface.  Min guard from recliner and mod assist from commode without armrests.    Ambulation/Gait Ambulation/Gait assistance: Min guard;Min assist Ambulation Distance (Feet): 180 Feet Assistive device: Rolling walker (2 wheeled) Gait Pattern/deviations: Shuffle;Trunk flexed;Decreased stride length Gait velocity: decreased   General Gait Details: Pt moving slowly with ambulation. Cues/assist to negotiate obstacles in halls.  Cues for forward gaze and scapular retraction to improve posture.   Stairs            Wheelchair Mobility    Modified Rankin (Stroke Patients Only)       Balance Overall balance assessment: Needs  assistance   Sitting balance-Leahy Scale: Fair       Standing balance-Leahy Scale: Poor                      Cognition Arousal/Alertness: Awake/alert Behavior During Therapy: WFL for tasks assessed/performed Overall Cognitive Status: Within Functional Limits for tasks assessed                      Exercises General Exercises - Lower Extremity Long Arc Quad: AROM;Both;20 reps;Seated Hip Flexion/Marching: AROM;Both;20 reps;Seated Other Exercises Other Exercises: Assist for technique with Incentive Spirometer.  Pt performed 1x10 reps 250-560ml    General Comments        Pertinent Vitals/Pain Pain Assessment: 0-10 Pain Score: 2  Pain Location: abdomen Pain Intervention(s): Monitored during session;Repositioned    Home Living                      Prior Function            PT Goals (current goals can now be found in the care plan section) Acute Rehab PT Goals Patient Stated Goal: get around better Potential to Achieve Goals: Good Progress towards PT goals: Progressing toward goals    Frequency  Min 3X/week    PT Plan Current plan remains appropriate    Co-evaluation             End of Session Equipment Utilized During Treatment: Gait belt Activity Tolerance: Patient limited by pain Patient left: in chair;with call bell/phone within reach     Time: [id number] PT Time  Calculation (min) (ACUTE ONLY): 23 min  Charges:  $Gait Training: 8-22 mins $Therapeutic Activity: 8-22 mins                    G Codes:      Cristela Blue Jul 25, 2015, 4:03 PM Governor Rooks, PTA pager [phone]

## 2015-07-15 NOTE — Progress Notes (Signed)
HEMATOLOGY-ONCOLOGY PROGRESS NOTE  SUBJECTIVE: Patient having recurrent loose stools. She is having breakfast. Continues to have mild to moderate abdominal discomfort. She is sitting up in a chair. 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 She was doing quite well clinically and we had in fact recently discharged her from our clinic after 10 years. Patient presented to the hospital for the past month with small bowel obstruction and she underwent recent surgery which revealed metastatic invasive lobular cancer. The scans also revealed a right hepatic lobe metastatic disease. She is likely to be discharged home today. We are asked to see her. With regards to overall treatment plan.  OBJECTIVE: REVIEW OF SYSTEMS:   Constitutional: Denies fevers, chills or abnormal weight loss Eyes: Denies blurriness of vision Ears, nose, mouth, throat, and face: Denies mucositis or sore throat Respiratory: Denies cough, dyspnea or wheezes Cardiovascular: Denies palpitation, chest discomfort Gastrointestinal:  Diarrhea and abdominal discomfort Skin: Denies abnormal skin rashes Lymphatics: Denies new lymphadenopathy or easy bruising Neurological:Denies numbness, tingling or new weaknesses Behavioral/Psych: Mood is stable, no new changes  Extremities: No lower extremity edema Breast:  denies any pain or lumps or nodules in either breasts All other systems were reviewed with the patient and are negative.  I have reviewed the past medical history, past surgical history, social history and family history with the patient and they are unchanged from previous note.   PHYSICAL EXAMINATION: ECOG PERFORMANCE STATUS: 2 - Symptomatic, <50% confined to bed  Filed  Vitals:   07/15/15 0532 07/15/15 1358  BP: 100/76 125/71  Pulse: 79 76  Temp: 98.3 F (36.8 C) 98.5 F (36.9 C)  Resp: 18 18   Filed Weights   07/10/15 0003  Weight: 167 lb (75.751 kg)    GENERAL:alert, no distress and comfortable SKIN: skin color, texture, turgor are normal, no rashes or significant lesions EYES: normal, Conjunctiva are pink and non-injected, sclera clear OROPHARYNX:no exudate, no erythema and lips, buccal mucosa, and tongue normal  NECK: supple, thyroid normal size, non-tender, without nodularity LYMPH:  no palpable lymphadenopathy in the cervical, axillary or inguinal LUNGS: clear to auscultation and percussion with normal breathing effort HEART: regular rate & rhythm and no murmurs and no lower extremity edema ABDOMEN Abdominal tenderness Musculoskeletal:no cyanosis of digits and no clubbing  NEURO: alert & oriented x 3 with fluent speech, no focal motor/sensory deficits  LABORATORY DATA:  I have reviewed the data as listed CMP Latest Ref Rng 07/15/2015 07/14/2015 07/13/2015  Glucose 65 - 99 mg/dL 151(H) 163(H) -  BUN 6 - 20 mg/dL 12 15 -  Creatinine 0.44 - 1.00 mg/dL 0.93 0.88 -  Sodium 135 - 145 mmol/L 139 138 -  Potassium 3.5 - 5.1 mmol/L 3.7 4.0 5.4(H)  Chloride 101 - 111 mmol/L 109 109 -  CO2 22 - 32 mmol/L 23 20(L) -  Calcium 8.9 - 10.3 mg/dL 8.2(L) 8.2(L) -  Total Protein 6.5 - 8.1 g/dL - - -  Total Bilirubin 0.3 - 1.2 mg/dL - - -  Alkaline Phos 38 - 126 U/L - - -  AST 15 - 41 U/L - - -  ALT 14 - 54 U/L - - -    Lab Results  Component Value Date   WBC 6.9 07/15/2015   HGB 9.1* 07/15/2015  HCT 30.2* 07/15/2015   MCV 83.2 07/15/2015   PLT 292 07/15/2015   NEUTROABS 11.9* 07/12/2015    ASSESSMENT AND PLAN: 1. Metastatic breast cancer invasive lobular carcinoma presenting a small bowel obstruction with liver metastases. Patient is likely to be discharged home today. I discussed the case with the patient and her son (on the phone). She is  going to be seen by Dr. Humphrey Rolls at [facility]. I instructed the family that we are very happy to transfer her care to Dr. Humphrey Rolls. Patient and her son have been asked to call us if she has any further questions or concerns.  I will cancel the follow-up appointment that was made for her at [facility].

## 2015-07-15 NOTE — Progress Notes (Signed)
RN called to patient's room to follow up with concerns of patient's son. Per son, he wanted to express frustrations about his mom's diagnosis after her pathology report results. Christean Grief (son) felt that her diagnosis could have been found within the last four weeks when patient was hospitalized two additional times. Unit director made aware that Christean Grief would like to speak to "someone higher up".

## 2015-07-16 LAB — GLUCOSE, CAPILLARY
GLUCOSE-CAPILLARY: 178 mg/dL — AB (ref 65–99)
GLUCOSE-CAPILLARY: 182 mg/dL — AB (ref 65–99)
Glucose-Capillary: 118 mg/dL — ABNORMAL HIGH (ref 65–99)

## 2015-07-16 NOTE — Discharge Planning (Signed)
Discharged to SNF with all personal belongings per PTAR at 1440. AVS and rx sent with pt. In addition to CSW prepared papers.

## 2015-07-16 NOTE — Progress Notes (Signed)
Patient medically stable for discharge to skilled nursing facility once bed available. Please refer to discharge summary completed 07/15/2015. No changes in medical management since 07/15/2015.  Leisa Lenz [facility] [id number]

## 2015-07-16 NOTE — Progress Notes (Signed)
Patient ID: Monica Morrison, female   DOB: 04-10-1945, 70 y.o.   MRN: [id number]     [address] [place], [place] [address]    Phone: [phone] FAX: [phone]     Subjective: No n/v.  Stools are less frequent.  Tolerating POs.  Afebrile.  VSS.    Objective:  Vital signs:  Filed Vitals:   07/15/15 0532 07/15/15 1358 07/15/15 2210 07/16/15 0627  BP: 100/76 125/71 140/73 144/67  Pulse: 79 76 79 67  Temp: 98.3 F (36.8 C) 98.5 F (36.9 C) 98.9 F (37.2 C) 98.9 F (37.2 C)  TempSrc: Oral Oral Oral Oral  Resp: 18 18 17 16   Height:      Weight:      SpO2: 97% 99% 99% 99%    Last BM Date: 07/09/15  Intake/Output   Yesterday:  05/24 0701 - 05/25 0700 In: 480 [P.O.:480] Out: -  This shift:    Physical Exam: General: Pt awake/alert/oriented x4 in no acute distress Abdomen: Soft. Nondistended. Mildly tender at incisions only. Incision without erythema, wound edges are approximated. No evidence of peritonitis. No incarcerated hernias.     Problem List:   Principal Problem:   SBO (small bowel obstruction) (HCC) Active Problems:   Breast cancer of lower-outer quadrant of left female breast ([place])   Diabetes mellitus with complication (HCC)   CKD (chronic kidney disease) stage 3, GFR 30-59 ml/min    Results:   Labs: Results for orders placed or performed during the hospital encounter of 07/10/15 (from the past 48 hour(s))  Glucose, capillary     Status: Abnormal   Collection Time: 07/14/15  7:59 AM  Result Value Ref Range   Glucose-Capillary 145 (H) 65 - 99 mg/dL  Glucose, capillary     Status: Abnormal   Collection Time: 07/14/15  1:16 PM  Result Value Ref Range   Glucose-Capillary 209 (H) 65 - 99 mg/dL  Glucose, capillary     Status: Abnormal   Collection Time: 07/14/15  5:47 PM  Result Value Ref Range   Glucose-Capillary 201 (H) 65 - 99 mg/dL  Glucose, capillary     Status: Abnormal    Collection Time: 07/14/15  8:20 PM  Result Value Ref Range   Glucose-Capillary 179 (H) 65 - 99 mg/dL  Glucose, capillary     Status: Abnormal   Collection Time: 07/15/15 12:13 AM  Result Value Ref Range   Glucose-Capillary 151 (H) 65 - 99 mg/dL  Glucose, capillary     Status: Abnormal   Collection Time: 07/15/15  4:05 AM  Result Value Ref Range   Glucose-Capillary 156 (H) 65 - 99 mg/dL  Basic metabolic panel     Status: Abnormal   Collection Time: 07/15/15  5:01 AM  Result Value Ref Range   Sodium 139 135 - 145 mmol/L   Potassium 3.7 3.5 - 5.1 mmol/L   Chloride 109 101 - 111 mmol/L   CO2 23 22 - 32 mmol/L   Glucose, Bld 151 (H) 65 - 99 mg/dL   BUN 12 6 - 20 mg/dL   Creatinine, Ser 0.93 0.44 - 1.00 mg/dL   Calcium 8.2 (L) 8.9 - 10.3 mg/dL   GFR calc non Af Amer >60 >60 mL/min   GFR calc Af Amer >60 >60 mL/min    Comment: (NOTE) The eGFR has been calculated using the CKD EPI equation. This calculation has not been validated in all clinical situations.  eGFR's persistently <60 mL/min signify possible Chronic Kidney Disease.    Anion gap 7 5 - 15  CBC     Status: Abnormal   Collection Time: 07/15/15  5:01 AM  Result Value Ref Range   WBC 6.9 4.0 - 10.5 K/uL   RBC 3.63 (L) 3.87 - 5.11 MIL/uL   Hemoglobin 9.1 (L) 12.0 - 15.0 g/dL   HCT 30.2 (L) 36.0 - 46.0 %   MCV 83.2 78.0 - 100.0 fL   MCH 25.1 (L) 26.0 - 34.0 pg   MCHC 30.1 30.0 - 36.0 g/dL   RDW 17.9 (H) 11.5 - 15.5 %   Platelets 292 150 - 400 K/uL  Glucose, capillary     Status: Abnormal   Collection Time: 07/15/15  7:34 AM  Result Value Ref Range   Glucose-Capillary 140 (H) 65 - 99 mg/dL  Glucose, capillary     Status: Abnormal   Collection Time: 07/15/15 11:38 AM  Result Value Ref Range   Glucose-Capillary 123 (H) 65 - 99 mg/dL  Glucose, capillary     Status: Abnormal   Collection Time: 07/15/15  5:40 PM  Result Value Ref Range   Glucose-Capillary 194 (H) 65 - 99 mg/dL  Glucose, capillary     Status: Abnormal    Collection Time: 07/15/15  7:58 PM  Result Value Ref Range   Glucose-Capillary 171 (H) 65 - 99 mg/dL  Glucose, capillary     Status: Abnormal   Collection Time: 07/16/15 12:13 AM  Result Value Ref Range   Glucose-Capillary 182 (H) 65 - 99 mg/dL  Glucose, capillary     Status: Abnormal   Collection Time: 07/16/15  4:39 AM  Result Value Ref Range   Glucose-Capillary 178 (H) 65 - 99 mg/dL    Imaging / Studies: No results found.  Medications / Allergies:  Scheduled Meds: . docusate sodium  100 mg Oral BID  . enoxaparin (LOVENOX) injection  40 mg Subcutaneous Q24H  . escitalopram  10 mg Oral Daily  . insulin aspart  0-15 Units Subcutaneous Q4H  . levothyroxine  112 mcg Oral QAC breakfast  . metoprolol tartrate  25 mg Oral BID  . pantoprazole  40 mg Oral Daily  . sodium chloride flush  3 mL Intravenous Q12H   Continuous Infusions: . lactated ringers 10 mL/hr at 07/10/15 1050   PRN Meds:.acetaminophen, ALPRAZolam, HYDROcodone-acetaminophen, methocarbamol (ROBAXIN)  IV, morphine injection, sodium chloride flush  Antibiotics: Anti-infectives    None          Assessment/Plan SBO due to tumor, metastatic cancer, primary likely from breast.  POD#6 exploratory laparotomy, SBR---Dr. Grandville Silos -tolerating POs, pain controlled, continue mobilizing, incentive spirometry -pathology discussed with the patient and her son on 5/23. seen by Dr. Lindi Adie, will see onc at [facility]  -remove staples 5/30 -follow up has been arranged  FEN-no issues VTE prophylaxis-SCD/lovenox Dispo-SNF when bed available    Erby Pian, [facility] Pager [phone](7A-4:30P) For consults and floor pages call [phone](7A-4:30P)  07/16/2015 7:28 AM

## 2015-07-16 NOTE — Clinical Social Work Note (Addendum)
Patient to be d/c'ed today to [facility].  Patient and family agreeable to plans will transport via ems RN to call report to [phone].  CSW notified Christean Grief patient's son at [phone].  Evette Cristal, MSW, [place]

## 2015-07-17 ENCOUNTER — Encounter: Payer: Self-pay | Admitting: Internal Medicine

## 2015-07-17 ENCOUNTER — Non-Acute Institutional Stay (SKILLED_NURSING_FACILITY): Payer: Commercial Managed Care - HMO | Admitting: Internal Medicine

## 2015-07-17 DIAGNOSIS — F329 Major depressive disorder, single episode, unspecified: Secondary | ICD-10-CM | POA: Diagnosis not present

## 2015-07-17 DIAGNOSIS — K56609 Unspecified intestinal obstruction, unspecified as to partial versus complete obstruction: Secondary | ICD-10-CM

## 2015-07-17 DIAGNOSIS — R197 Diarrhea, unspecified: Secondary | ICD-10-CM | POA: Diagnosis not present

## 2015-07-17 DIAGNOSIS — K5669 Other intestinal obstruction: Secondary | ICD-10-CM

## 2015-07-17 DIAGNOSIS — E118 Type 2 diabetes mellitus with unspecified complications: Secondary | ICD-10-CM

## 2015-07-17 DIAGNOSIS — E785 Hyperlipidemia, unspecified: Secondary | ICD-10-CM

## 2015-07-17 DIAGNOSIS — E039 Hypothyroidism, unspecified: Secondary | ICD-10-CM

## 2015-07-17 DIAGNOSIS — F32A Depression, unspecified: Secondary | ICD-10-CM

## 2015-07-17 DIAGNOSIS — D62 Acute posthemorrhagic anemia: Secondary | ICD-10-CM

## 2015-07-17 DIAGNOSIS — R531 Weakness: Secondary | ICD-10-CM

## 2015-07-17 DIAGNOSIS — E46 Unspecified protein-calorie malnutrition: Secondary | ICD-10-CM

## 2015-07-17 DIAGNOSIS — I10 Essential (primary) hypertension: Secondary | ICD-10-CM | POA: Diagnosis not present

## 2015-07-17 DIAGNOSIS — C50512 Malignant neoplasm of lower-outer quadrant of left female breast: Secondary | ICD-10-CM

## 2015-07-17 DIAGNOSIS — K219 Gastro-esophageal reflux disease without esophagitis: Secondary | ICD-10-CM

## 2015-07-17 NOTE — Progress Notes (Signed)
LOCATION: [facility]  PCP: Gara Kroner, MD   Code Status: DNR  Goals of care: Advanced Directive information Advanced Directives 07/17/2015  Does patient have an advance directive? Yes  Type of Advance Directive Out of facility DNR (pink MOST or yellow form)  Does patient want to make changes to advanced directive? No - Patient declined  Copy of advanced directive(s) in chart? Yes       Extended Emergency Contact Information Primary Emergency Contact: Eugenio,Edwin Address: [place], [place] Mobile Phone: [phone] Relation: Son Secondary Emergency Contact: Richardson Dopp [place] Phone: [phone] Relation: Sister   No Known Allergies  Chief Complaint  Patient presents with  . New Admit To SNF    New Admission     HPI:  Patient is a 70 y.o. female seen today for short term rehabilitation post hospital admission from 07/10/15-07/15/15 with small bowel obstruction and underwent exploratory laprotomy with small bowel resection on 07/10/15. 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 The obstruction was thought to be from adenocarcinoma. She has PMH of breast cancer, HTN, diabetes, dyslipidemia, ckd stage 3 among others. She is seen in her room today. She has been having loose stools and has had 4 loose stools since this am. She is currently on liquid diet and would like her diet advanced. She mentions being on solid food in the hospital.   Review of Systems:  Constitutional: Negative for fever, chills,diaphoresis. Feels weak and tired.  HENT: Negative for headache, congestion, nasal discharge, sore throat, difficulty swallowing.   Eyes: Negative for blurred vision, double vision and discharge.  Respiratory: Negative for cough, shortness of breath and wheezing.   Cardiovascular: Negative for chest pain, palpitations, leg swelling.    Gastrointestinal: Negative for heartburn, nausea, vomiting, loss of appetite. Denies blood and mucus in stool. Positive for abdominal wall soreness.  Genitourinary: Negative for dysuria and flank pain.  Musculoskeletal: Negative for back pain, fall in the facility.  Skin: Negative for itching, rash.  Neurological: Negative for dizziness. Positive for generalized weakness Psychiatric/Behavioral: Negative for depression   Past Medical History  Diagnosis Date  . Cancer (HCC)     breast  . Hypertension   . Diabetes mellitus   . Arthritis   . Breast cancer ([place])   . CKD (chronic kidney disease)   . Hypothyroidism 06/22/2015   Past Surgical History  Procedure Laterality Date  . Abdominal hysterectomy    . Breast surgery    . Laparotomy N/A 07/10/2015    Procedure: EXPLORATORY LAPAROTOMY ;  Surgeon: Georganna Skeans, MD;  Location: [facility];  Service: General;  Laterality: N/A;  . Bowel resection N/A 07/10/2015    Procedure: SMALL BOWEL RESECTION;  Surgeon: Georganna Skeans, MD;  Location: [facility];  Service: General;  Laterality: N/A;   Social History:   reports that she has never smoked. She does not have any smokeless tobacco history on file. She reports that she does not drink alcohol or use illicit drugs.  Family History  Problem Relation Age of Onset  . Diabetes Mother   . Hypertension Mother   . Diabetes Sister   . Hypertension Sister     Medications:   Medication List       This list is accurate as of: 07/17/15  2:23 PM.  Always use your most recent med list.  alendronate 70 MG tablet  Commonly known as:  FOSAMAX  Take 70 mg by mouth every 7 (seven) days. On Sunday     ALPRAZolam 0.25 MG tablet  Commonly known as:  XANAX  Take 1 tablet (0.25 mg total) by mouth at bedtime as needed.     atorvastatin 20 MG tablet  Commonly known as:  LIPITOR  Take 20 mg by mouth daily.     bismuth subsalicylate 99991111 [id number] suspension  Commonly known as:  PEPTO BISMOL   Take 15 mLs by mouth 2 (two) times daily as needed for diarrhea or loose stools.     CALCIUM 500 PO  Take 500 mg by mouth daily.     escitalopram 10 MG tablet  Commonly known as:  LEXAPRO  Take 10 mg by mouth daily.     glipiZIDE 5 MG tablet  Commonly known as:  GLUCOTROL  Take 10 mg by mouth daily.     levothyroxine 112 MCG tablet  Commonly known as:  SYNTHROID, LEVOTHROID  Take 112 mcg by mouth daily.     metoprolol tartrate 25 MG tablet  Commonly known as:  LOPRESSOR  Take 1 tablet (25 mg total) by mouth 2 (two) times daily.     multivitamin with minerals Tabs tablet  Take 1 tablet by mouth daily.     omeprazole 20 MG capsule  Commonly known as:  PRILOSEC  Take 20 mg by mouth daily.     ONE TOUCH ULTRA TEST test strip  Generic drug:  glucose blood     pioglitazone 15 MG tablet  Commonly known as:  ACTOS  Take 15 mg by mouth daily.     PROCEL Powd  Take 2 scoop by mouth 2 (two) times daily.     saccharomyces boulardii 250 MG capsule  Commonly known as:  FLORASTOR  Take 250 mg by mouth daily. Stop date 07/31/15        Immunizations: Immunization History  Administered Date(s) Administered  . PPD Test 07/16/2015     Physical Exam: Filed Vitals:   07/17/15 1415  BP: 158/91  Pulse: 83  Temp: 98.4 F (36.9 C)  TempSrc: Oral  Resp: 18  Height: 5\' 2"  (1.575 m)  Weight: 167 lb (75.751 kg)  SpO2: 97%   Body mass index is 30.54 kg/(m^2).  General- elderly female, obese but frail, in no acute distress Head- normocephalic, atraumatic Nose- no maxillary or frontal sinus tenderness, no nasal discharge Throat- moist mucus membrane  Eyes- PERRLA, EOMI, no pallor, no icterus, no discharge, normal conjunctiva, normal sclera Neck- no cervical lymphadenopathy Cardiovascular- normal s1,s2, no murmur, noleg edema Respiratory- bilateral clear to auscultation, no wheeze, no rhonchi, no crackles, no use of accessory muscles Abdomen- bowel sounds present, soft, mild  tenderness over surgical area Musculoskeletal- able to move all 4 extremities, generalized weakness, 1+ leg edema Neurological- alert and oriented to person, place and time Skin- warm and dry, abdominal wall incision with staples in place and healing well Psychiatry- normal mood and affect    Labs reviewed: Basic Metabolic Panel:  Recent Labs  06/19/15 0404  07/11/15 0447 07/12/15 0535 07/13/15 0533 07/13/15 0748 07/14/15 0430 07/15/15 0501  NA 144  < > 143 140 134*  --  138 139  K 3.5  < > 4.6 4.3 6.9* 5.4* 4.0 3.7  CL 113*  < > 108 106 109  --  109 109  CO2 21*  < > 19* 24 18*  --  20* 23  GLUCOSE  236*  < > 122* 117* 93  --  163* 151*  BUN 19  < > 19 18 16   --  15 12  CREATININE 1.87*  < > 1.35* 1.27* 1.03*  --  0.88 0.93  CALCIUM 7.7*  < > 8.4* 7.9* 7.2*  --  8.2* 8.2*  MG  --   < > 1.4* 1.4* 1.6*  --   --   --   PHOS 3.0  --   --   --   --   --   --   --   < > = values in this interval not displayed. Liver Function Tests:  Recent Labs  06/30/15 0433 07/10/15 0018 07/12/15 0535  AST 30 19 20   ALT 28 48 17  ALKPHOS 145* 252* 152*  BILITOT 0.3 0.6 0.5  PROT 5.9* 6.8 5.4*  ALBUMIN 2.8* 2.9* 2.0*    Recent Labs  06/15/15 0638 06/29/15 1805 07/10/15 0018  LIPASE 20 29 30    No results for input(s): AMMONIA in the last 8760 hours. CBC:  Recent Labs  07/04/15 0509 07/10/15 0018  07/12/15 0535 07/13/15 0533 07/14/15 0430 07/15/15 0501  WBC 7.3 9.0  < > 13.4* 11.5* 10.7* 6.9  NEUTROABS 5.8 7.3  --  11.9*  --   --   --   HGB 10.1* 11.1*  < > 9.7* 8.4* 9.7* 9.1*  HCT 32.7* 37.0  < > 32.7* 27.7* 32.2* 30.2*  MCV 83.6 85.5  < > 85.6 85.5 83.2 83.2  PLT 223 346  < > 265 222 302 292  < > = values in this interval not displayed. Cardiac Enzymes: No results for input(s): CKTOTAL, CKMB, CKMBINDEX, TROPONINI in the last 8760 hours. BNP: Invalid input(s): POCBNP CBG:  Recent Labs  07/16/15 0013 07/16/15 0439 07/16/15 0734  GLUCAP 182* 178* 118*     Radiological Exams: Ct Abdomen Pelvis W Contrast  06/29/2015  CLINICAL DATA:  Abdominal pain and vomiting today. Recent colitis and small bowel obstruction. Left breast carcinoma. EXAM: CT ABDOMEN AND PELVIS WITH CONTRAST TECHNIQUE: Multidetector CT imaging of the abdomen and pelvis was performed using the standard protocol following bolus administration of intravenous contrast. CONTRAST:  21mL ISOVUE-300 IOPAMIDOL (ISOVUE-300) INJECTION 61% COMPARISON:  06/15/2015 and 02/01/2007 FINDINGS: Lower chest:  No acute findings. Hepatobiliary: Poorly defined hypovascular mass in the posterior right hepatic lobe measures 3.0 cm on image 15/series 2 and has not significantly changed compared to most recent exam although it is new compared to 2008 exam. This is suspicious for a liver metastasis. No other definite liver masses are identified. Tiny calcified gallstones are again seen. Gallbladder is mildly distended but there is no evidence of wall thickening or pericholecystic inflammatory changes. Pancreas: No mass or other significant abnormality identified. Spleen: Within normal limits in size and appearance. Adrenals/Urinary Tract: Bilateral adrenal masses are again seen measuring 3.5 cm on the right and 3.2 cm on the left. These are unchanged since most recent exam but are new since 2008 and consistent with adrenal metastases. Tiny left renal cysts are again noted however there is no evidence of renal masses. Moderate right hydronephrosis and ureterectasis shows no significant change. Asymmetric wall thickening is again seen along the right lateral and posterior bladder walls. This could be due to neoplasm or cystitis. Stomach/Bowel: Small to moderate hiatal hernia seen. Multiple moderately dilated small bowel loops are again seen containing air-fluid levels. There is a transition point in the anterior lower abdomen were there is a thickened small  bowel loop as well as small less than 1 cm lymph nodes in the  adjacent small bowel mesentery. Colonic diverticulosis also noted, without of diverticulitis. Vascular/Lymphatic: Bilateral iliac and retroperitoneal surgical clips seen. No pathologically enlarged lymph nodes identified.No evidence of abdominal aortic aneurysm. Reproductive: Prior hysterectomy noted. Adnexal regions are unremarkable in appearance. Other: None. Musculoskeletal:  No suspicious bone lesions identified. IMPRESSION: Stable appearance of mid small bowel obstruction with transition point in the anterior lower abdomen. Small bowel wall thickening and adjacent sub-cm mesenteric lymph nodes at the transition point may be secondary to metastatic disease, although differential diagnosis also includes ischemic and inflammatory etiologies as well as adhesion. Stable moderate right hydronephrosis and asymmetric bladder wall thickening. This may be due to bladder carcinoma or cystitis. 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 No significant change in 3 cm hypovascular right hepatic lobe mass, suspicious for hepatic metastasis. Cholelithiasis.  No radiographic evidence of cholecystitis. Small to moderate hiatal hernia. Electronically Signed   By: Earle Gell M.D.   On: 06/29/2015 21:17   Dg Abd 2 Views  07/01/2015  CLINICAL DATA:  Abdominal pain EXAM: ABDOMEN - 2 VIEW COMPARISON:  06/30/2015 FINDINGS: NG tube remains coiled in the stomach. There is contrast throughout the colon. Postop changes are noted. No disproportionate dilatation of bowel. There is no free intraperitoneal gas. Fatty tissue is seen surrounding the right lobe of the liver. There are nonspecific air-fluid levels in non-dilated bowel. IMPRESSION: Resolved small bowel obstruction pattern. No free intraperitoneal gas. Electronically Signed   By: Marybelle Killings M.D.   On: 07/01/2015 09:47   Dg Abd Acute W/chest  07/10/2015  CLINICAL DATA:  Increased abdominal pain and vomiting for 1 day, small  bowel obstruction, hypertension, diabetes mellitus, breast cancer EXAM: DG ABDOMEN ACUTE W/ 1V CHEST COMPARISON:  Abdominal radiographs 07/01/2015, chest radiograph 01/01/2009 FINDINGS: Normal heart size, mediastinal contours, and pulmonary vascularity. Minimal central peribronchial thickening. No acute infiltrate, pleural effusion or pneumothorax. Post LEFT breast surgery and axillary node dissection. Dilated small bowel loops with paucity of colonic gas consistent with small bowel obstruction. No bowel wall thickening or free intraperitoneal air. Surgical clips in pelvis bilaterally and in the RIGHT paraspinal region as well as anterior to L2 and L3. Bones demineralized with scattered degenerative changes of the spine. IMPRESSION: Small bowel obstruction. Electronically Signed   By: Lavonia Dana M.D.   On: 07/10/2015 01:18   Dg Abd Portable 1v-small Bowel Obstruction Protocol-initial, 8 Hr Delay  06/30/2015  CLINICAL DATA:  Small bowel obstruction.  8 hour delayed film. EXAM: PORTABLE ABDOMEN - 1 VIEW COMPARISON:  Abdominal radiograph prior today and CT on 06/29/2015 FINDINGS: Nasogastric tube is again seen within the stomach. Oral contrast is seen within the colon from prior CT. In addition, there is increased oral contrast material in mildly dilated small bowel loops in the right and mid abdomen. This is suspicious for a partial small bowel obstruction. IMPRESSION: Findings suspicious for partial small bowel obstruction. Nasogastric tube in stomach. Electronically Signed   By: Earle Gell M.D.   On: 06/30/2015 19:45   Dg Abd Portable 1v-small Bowel Protocol-position Verification  06/30/2015  CLINICAL DATA:  NG tube placement EXAM: PORTABLE ABDOMEN - 1 VIEW COMPARISON:  06/16/2015 FINDINGS: NG tube loops in the fundus of the stomach with the tip in the distal stomach. Single prominent small bowel loop in the left lower abdomen. Oral contrast material noted within decompressed colon. Surgical clips in the  lower  abdomen and pelvis. IMPRESSION: NG tube tip in the distal stomach. Single prominent loop of small bowel in the left lower abdomen. Electronically Signed   By: Rolm Baptise M.D.   On: 06/30/2015 10:56    Assessment/Plan  Generalized weakness From deconditioning. Will have patient work with PT/OT as tolerated to regain strength and restore function.  Fall precautions are in place.  Small bowel obstruction S/p ex-lap and bowel resection. Has been having loose stool. Step up her diet to solid food as tolerated. Avoid constipation. Check cbc and bmp. Has f/u with surgery. Continue wound care.   Diarrhea Has loose stools. Will send stool for c.diff. Give imodium prn for now. Continue florastor. Add metamucil to help bulk her stool. Check cbc and cmp with vital signs. D/c glipizide for now  Blood loss anemia Post op, monitor cbc  Protein calorie malnutrition Monitor po intake. Advance diet to mechanical soft diet and then to solid food as tolerated. Start procel and fortified meals. Monitor weight  Breast cancer with metastases Will need follow up with oncologyOsteoporosis Continue weekly fosamax for now  HLD Continue her statin  Depression Stable with lexapro, no changes made  Dm No results found for: HGBA1C Check a1c. Monitor cbg. Continue pioglitazone for now. D/c glipizide with her diarrhea. Reintroduce after review of a1c if needed.   Hypothyroidism Lab Results  Component Value Date   TSH 8.492* 06/21/2015   Check tsh and free t4. Continue levothyroxine for now  HTN Stable, continue lopressor, monitor bp  gerd Continue prilosec and monitor   Goals of care: short term rehabilitation   Labs/tests ordered: cbc with diff, cmp  Family/ staff Communication: reviewed care plan with patient and nursing supervisor    Blanchie Serve, MD Internal Medicine [address] [place], [place] [address] Cell Phone (Monday-Friday 8  am - 5 pm): [phone] On Call: [phone] and follow prompts after 5 pm and on weekends Office Phone: [phone] Office Fax: [phone]

## 2015-07-19 ENCOUNTER — Emergency Department (HOSPITAL_COMMUNITY): Payer: Commercial Managed Care - HMO | Admitting: Anesthesiology

## 2015-07-19 ENCOUNTER — Encounter (HOSPITAL_COMMUNITY): Admission: EM | Disposition: A | Discharge status: Hospice | Payer: Self-pay | Source: Home / Self Care

## 2015-07-19 ENCOUNTER — Inpatient Hospital Stay (HOSPITAL_COMMUNITY): Payer: Commercial Managed Care - HMO

## 2015-07-19 ENCOUNTER — Encounter (HOSPITAL_COMMUNITY): Payer: Self-pay

## 2015-07-19 ENCOUNTER — Inpatient Hospital Stay (HOSPITAL_COMMUNITY)
Admission: EM | Admit: 2015-07-19 | Discharge: 2015-07-30 | DRG: 329 | Disposition: A | Discharge status: Hospice | Payer: Commercial Managed Care - HMO | Attending: Surgery | Admitting: Surgery

## 2015-07-19 DIAGNOSIS — J9690 Respiratory failure, unspecified, unspecified whether with hypoxia or hypercapnia: Secondary | ICD-10-CM

## 2015-07-19 DIAGNOSIS — N183 Chronic kidney disease, stage 3 (moderate): Secondary | ICD-10-CM | POA: Diagnosis present

## 2015-07-19 DIAGNOSIS — E1122 Type 2 diabetes mellitus with diabetic chronic kidney disease: Secondary | ICD-10-CM | POA: Diagnosis present

## 2015-07-19 DIAGNOSIS — I952 Hypotension due to drugs: Secondary | ICD-10-CM | POA: Diagnosis not present

## 2015-07-19 DIAGNOSIS — A419 Sepsis, unspecified organism: Secondary | ICD-10-CM | POA: Diagnosis not present

## 2015-07-19 DIAGNOSIS — K659 Peritonitis, unspecified: Secondary | ICD-10-CM | POA: Diagnosis not present

## 2015-07-19 DIAGNOSIS — J9600 Acute respiratory failure, unspecified whether with hypoxia or hypercapnia: Secondary | ICD-10-CM | POA: Diagnosis not present

## 2015-07-19 DIAGNOSIS — E860 Dehydration: Secondary | ICD-10-CM | POA: Diagnosis present

## 2015-07-19 DIAGNOSIS — K631 Perforation of intestine (nontraumatic): Secondary | ICD-10-CM | POA: Diagnosis present

## 2015-07-19 DIAGNOSIS — Z515 Encounter for palliative care: Secondary | ICD-10-CM | POA: Insufficient documentation

## 2015-07-19 DIAGNOSIS — N189 Chronic kidney disease, unspecified: Secondary | ICD-10-CM | POA: Diagnosis present

## 2015-07-19 DIAGNOSIS — C800 Disseminated malignant neoplasm, unspecified: Secondary | ICD-10-CM | POA: Diagnosis present

## 2015-07-19 DIAGNOSIS — E8770 Fluid overload, unspecified: Secondary | ICD-10-CM | POA: Diagnosis not present

## 2015-07-19 DIAGNOSIS — L259 Unspecified contact dermatitis, unspecified cause: Secondary | ICD-10-CM | POA: Diagnosis not present

## 2015-07-19 DIAGNOSIS — E11649 Type 2 diabetes mellitus with hypoglycemia without coma: Secondary | ICD-10-CM | POA: Diagnosis present

## 2015-07-19 DIAGNOSIS — I129 Hypertensive chronic kidney disease with stage 1 through stage 4 chronic kidney disease, or unspecified chronic kidney disease: Secondary | ICD-10-CM | POA: Diagnosis present

## 2015-07-19 DIAGNOSIS — C784 Secondary malignant neoplasm of small intestine: Secondary | ICD-10-CM | POA: Diagnosis present

## 2015-07-19 DIAGNOSIS — Y92239 Unspecified place in hospital as the place of occurrence of the external cause: Secondary | ICD-10-CM | POA: Diagnosis not present

## 2015-07-19 DIAGNOSIS — R21 Rash and other nonspecific skin eruption: Secondary | ICD-10-CM | POA: Diagnosis not present

## 2015-07-19 DIAGNOSIS — E872 Acidosis: Secondary | ICD-10-CM | POA: Diagnosis present

## 2015-07-19 DIAGNOSIS — E43 Unspecified severe protein-calorie malnutrition: Secondary | ICD-10-CM | POA: Diagnosis present

## 2015-07-19 DIAGNOSIS — Y832 Surgical operation with anastomosis, bypass or graft as the cause of abnormal reaction of the patient, or of later complication, without mention of misadventure at the time of the procedure: Secondary | ICD-10-CM | POA: Diagnosis present

## 2015-07-19 DIAGNOSIS — R740 Nonspecific elevation of levels of transaminase and lactic acid dehydrogenase [LDH]: Secondary | ICD-10-CM | POA: Diagnosis present

## 2015-07-19 DIAGNOSIS — N179 Acute kidney failure, unspecified: Secondary | ICD-10-CM | POA: Diagnosis not present

## 2015-07-19 DIAGNOSIS — I959 Hypotension, unspecified: Secondary | ICD-10-CM | POA: Diagnosis not present

## 2015-07-19 DIAGNOSIS — Z452 Encounter for adjustment and management of vascular access device: Secondary | ICD-10-CM

## 2015-07-19 DIAGNOSIS — Z853 Personal history of malignant neoplasm of breast: Secondary | ICD-10-CM

## 2015-07-19 DIAGNOSIS — Y733 Surgical instruments, materials and gastroenterology and urology devices (including sutures) associated with adverse incidents: Secondary | ICD-10-CM | POA: Diagnosis present

## 2015-07-19 DIAGNOSIS — K316 Fistula of stomach and duodenum: Secondary | ICD-10-CM | POA: Diagnosis present

## 2015-07-19 DIAGNOSIS — E876 Hypokalemia: Secondary | ICD-10-CM | POA: Diagnosis not present

## 2015-07-19 DIAGNOSIS — E861 Hypovolemia: Secondary | ICD-10-CM | POA: Diagnosis present

## 2015-07-19 DIAGNOSIS — G9340 Encephalopathy, unspecified: Secondary | ICD-10-CM | POA: Diagnosis present

## 2015-07-19 DIAGNOSIS — T462X5A Adverse effect of other antidysrhythmic drugs, initial encounter: Secondary | ICD-10-CM | POA: Diagnosis present

## 2015-07-19 DIAGNOSIS — Z6828 Body mass index (BMI) 28.0-28.9, adult: Secondary | ICD-10-CM | POA: Diagnosis not present

## 2015-07-19 DIAGNOSIS — K5660 Unspecified intestinal obstruction: Secondary | ICD-10-CM | POA: Diagnosis present

## 2015-07-19 DIAGNOSIS — D649 Anemia, unspecified: Secondary | ICD-10-CM | POA: Diagnosis present

## 2015-07-19 DIAGNOSIS — E039 Hypothyroidism, unspecified: Secondary | ICD-10-CM | POA: Diagnosis present

## 2015-07-19 DIAGNOSIS — J9601 Acute respiratory failure with hypoxia: Secondary | ICD-10-CM | POA: Diagnosis not present

## 2015-07-19 DIAGNOSIS — I480 Paroxysmal atrial fibrillation: Secondary | ICD-10-CM | POA: Diagnosis present

## 2015-07-19 DIAGNOSIS — I471 Supraventricular tachycardia: Secondary | ICD-10-CM | POA: Diagnosis present

## 2015-07-19 DIAGNOSIS — E1165 Type 2 diabetes mellitus with hyperglycemia: Secondary | ICD-10-CM | POA: Diagnosis present

## 2015-07-19 DIAGNOSIS — L24A9 Irritant contact dermatitis due friction or contact with other specified body fluids: Secondary | ICD-10-CM

## 2015-07-19 DIAGNOSIS — K9189 Other postprocedural complications and disorders of digestive system: Secondary | ICD-10-CM | POA: Diagnosis present

## 2015-07-19 DIAGNOSIS — R451 Restlessness and agitation: Secondary | ICD-10-CM | POA: Diagnosis not present

## 2015-07-19 DIAGNOSIS — T148XXA Other injury of unspecified body region, initial encounter: Secondary | ICD-10-CM

## 2015-07-19 DIAGNOSIS — Z66 Do not resuscitate: Secondary | ICD-10-CM | POA: Diagnosis present

## 2015-07-19 DIAGNOSIS — I4891 Unspecified atrial fibrillation: Secondary | ICD-10-CM | POA: Diagnosis not present

## 2015-07-19 HISTORY — PX: LAPAROTOMY: SHX154

## 2015-07-19 LAB — CBC
HEMATOCRIT: 31 % — AB (ref 36.0–46.0)
HEMOGLOBIN: 9.4 g/dL — AB (ref 12.0–15.0)
MCH: 24.7 pg — AB (ref 26.0–34.0)
MCHC: 30.3 g/dL (ref 30.0–36.0)
MCV: 81.4 fL (ref 78.0–100.0)
Platelets: 276 10*3/uL (ref 150–400)
RBC: 3.81 MIL/uL — ABNORMAL LOW (ref 3.87–5.11)
RDW: 17.7 % — ABNORMAL HIGH (ref 11.5–15.5)
WBC: 5.7 10*3/uL (ref 4.0–10.5)

## 2015-07-19 LAB — TROPONIN I
TROPONIN I: 0.06 ng/mL — AB (ref <0.031)
TROPONIN I: 0.06 ng/mL — AB (ref <0.031)
Troponin I: 0.06 ng/mL — ABNORMAL HIGH (ref <0.031)

## 2015-07-19 LAB — POCT I-STAT 4, (NA,K, GLUC, HGB,HCT)
GLUCOSE: 176 mg/dL — AB (ref 65–99)
HEMATOCRIT: 39 % (ref 36.0–46.0)
HEMOGLOBIN: 13.3 g/dL (ref 12.0–15.0)
POTASSIUM: 4.5 mmol/L (ref 3.5–5.1)
SODIUM: 139 mmol/L (ref 135–145)

## 2015-07-19 LAB — GLUCOSE, CAPILLARY
GLUCOSE-CAPILLARY: 177 mg/dL — AB (ref 65–99)
GLUCOSE-CAPILLARY: 173 mg/dL — AB (ref 65–99)
Glucose-Capillary: 192 mg/dL — ABNORMAL HIGH (ref 65–99)
Glucose-Capillary: 220 mg/dL — ABNORMAL HIGH (ref 65–99)

## 2015-07-19 LAB — POCT I-STAT 7, (LYTES, BLD GAS, ICA,H+H)
Acid-base deficit: 12 mmol/L — ABNORMAL HIGH (ref 0.0–2.0)
BICARBONATE: 16.7 meq/L — AB (ref 20.0–24.0)
Calcium, Ion: 1 mmol/L — ABNORMAL LOW (ref 1.13–1.30)
HCT: 30 % — ABNORMAL LOW (ref 36.0–46.0)
Hemoglobin: 10.2 g/dL — ABNORMAL LOW (ref 12.0–15.0)
O2 Saturation: 100 %
PCO2 ART: 47.8 mmHg — AB (ref 35.0–45.0)
PH ART: 7.152 — AB (ref 7.350–7.450)
PO2 ART: 373 mmHg — AB (ref 80.0–100.0)
POTASSIUM: 3.3 mmol/L — AB (ref 3.5–5.1)
Sodium: 140 mmol/L (ref 135–145)
TCO2: 18 mmol/L (ref 0–100)

## 2015-07-19 LAB — POCT I-STAT 3, ART BLOOD GAS (G3+)
ACID-BASE DEFICIT: 5 mmol/L — AB (ref 0.0–2.0)
Bicarbonate: 22 mEq/L (ref 20.0–24.0)
O2 Saturation: 100 %
PCO2 ART: 48.2 mmHg — AB (ref 35.0–45.0)
PH ART: 7.264 — AB (ref 7.350–7.450)
PO2 ART: 472 mmHg — AB (ref 80.0–100.0)
Patient temperature: 97.2
TCO2: 24 mmol/L (ref 0–100)

## 2015-07-19 LAB — BASIC METABOLIC PANEL
ANION GAP: 7 (ref 5–15)
BUN: 17 mg/dL (ref 6–20)
CHLORIDE: 112 mmol/L — AB (ref 101–111)
CO2: 20 mmol/L — ABNORMAL LOW (ref 22–32)
Calcium: 6.6 mg/dL — ABNORMAL LOW (ref 8.9–10.3)
Creatinine, Ser: 1.59 mg/dL — ABNORMAL HIGH (ref 0.44–1.00)
GFR calc non Af Amer: 32 mL/min — ABNORMAL LOW (ref >60)
GFR, EST AFRICAN AMERICAN: 37 mL/min — AB (ref >60)
Glucose, Bld: 190 mg/dL — ABNORMAL HIGH (ref 65–99)
POTASSIUM: 2.9 mmol/L — AB (ref 3.5–5.1)
SODIUM: 139 mmol/L (ref 135–145)

## 2015-07-19 LAB — TYPE AND SCREEN
ABO/RH(D): O POS
ANTIBODY SCREEN: NEGATIVE

## 2015-07-19 LAB — LACTIC ACID, PLASMA: Lactic Acid, Venous: 1.5 mmol/L (ref 0.5–2.0)

## 2015-07-19 LAB — TSH: TSH: 5.004 u[IU]/mL — AB (ref 0.350–4.500)

## 2015-07-19 LAB — MRSA PCR SCREENING: MRSA BY PCR: NEGATIVE

## 2015-07-19 LAB — PREALBUMIN: Prealbumin: 2.5 mg/dL — ABNORMAL LOW (ref 18–38)

## 2015-07-19 LAB — TRIGLYCERIDES: Triglycerides: 60 mg/dL (ref <150)

## 2015-07-19 SURGERY — LAPAROTOMY, EXPLORATORY
Anesthesia: General | Site: Abdomen

## 2015-07-19 MED ORDER — HYDROMORPHONE HCL 1 MG/ML IJ SOLN: 1.0000 mg | INTRAMUSCULAR | Status: DC | PRN

## 2015-07-19 MED ORDER — LACTATED RINGERS IV BOLUS (SEPSIS)
1000.0000 mL | Freq: Once | INTRAVENOUS | Status: AC
  Administered 2015-07-19: 1000 mL via INTRAVENOUS

## 2015-07-19 MED ORDER — ALBUMIN HUMAN 5 % IV SOLN
INTRAVENOUS | Status: DC | PRN
  Administered 2015-07-19 (×2): via INTRAVENOUS

## 2015-07-19 MED ORDER — SUCCINYLCHOLINE CHLORIDE 20 MG/ML IJ SOLN
INTRAMUSCULAR | Status: DC | PRN
  Administered 2015-07-19: 100 mg via INTRAVENOUS

## 2015-07-19 MED ORDER — KCL IN DEXTROSE-NACL 20-5-0.9 MEQ/L-%-% IV SOLN
INTRAVENOUS | Status: DC
  Administered 2015-07-19 – 2015-07-20 (×2): via INTRAVENOUS
  Filled 2015-07-19 (×4): qty 1000

## 2015-07-19 MED ORDER — LACTATED RINGERS IV SOLN
INTRAVENOUS | Status: DC | PRN
  Administered 2015-07-19 (×2): via INTRAVENOUS

## 2015-07-19 MED ORDER — ENOXAPARIN SODIUM 40 MG/0.4ML ~~LOC~~ SOLN: 40.0000 mg | SUBCUTANEOUS | Status: DC

## 2015-07-19 MED ORDER — CHLORHEXIDINE GLUCONATE 0.12% ORAL RINSE (MEDLINE KIT)
15.0000 mL | Freq: Two times a day (BID) | OROMUCOSAL | Status: DC
  Administered 2015-07-19 – 2015-07-27 (×17): 15 mL via OROMUCOSAL

## 2015-07-19 MED ORDER — FENTANYL CITRATE (PF) 250 MCG/5ML IJ SOLN
INTRAMUSCULAR | Status: AC
  Filled 2015-07-19: qty 5

## 2015-07-19 MED ORDER — SODIUM BICARBONATE 8.4 % IV SOLN
INTRAVENOUS | Status: AC
  Filled 2015-07-19: qty 50

## 2015-07-19 MED ORDER — INSULIN ASPART 100 UNIT/ML ~~LOC~~ SOLN
0.0000 [IU] | SUBCUTANEOUS | Status: DC
  Administered 2015-07-19: 3 [IU] via SUBCUTANEOUS
  Administered 2015-07-19: 2 [IU] via SUBCUTANEOUS
  Administered 2015-07-19: 5 [IU] via SUBCUTANEOUS
  Administered 2015-07-19: 3 [IU] via SUBCUTANEOUS
  Administered 2015-07-20 (×2): 2 [IU] via SUBCUTANEOUS
  Administered 2015-07-20: 3 [IU] via SUBCUTANEOUS
  Administered 2015-07-20: 5 [IU] via SUBCUTANEOUS
  Administered 2015-07-20 (×2): 3 [IU] via SUBCUTANEOUS
  Administered 2015-07-21: 5 [IU] via SUBCUTANEOUS
  Administered 2015-07-21 (×2): 3 [IU] via SUBCUTANEOUS
  Administered 2015-07-21: 5 [IU] via SUBCUTANEOUS
  Administered 2015-07-21: 3 [IU] via SUBCUTANEOUS
  Administered 2015-07-22: 5 [IU] via SUBCUTANEOUS
  Administered 2015-07-22 (×3): 8 [IU] via SUBCUTANEOUS
  Administered 2015-07-22: 3 [IU] via SUBCUTANEOUS
  Administered 2015-07-23: 2 [IU] via SUBCUTANEOUS
  Administered 2015-07-23: 3 [IU] via SUBCUTANEOUS
  Administered 2015-07-23 (×2): 2 [IU] via SUBCUTANEOUS
  Administered 2015-07-23 – 2015-07-25 (×9): 3 [IU] via SUBCUTANEOUS
  Administered 2015-07-25: 2 [IU] via SUBCUTANEOUS
  Administered 2015-07-25: 3 [IU] via SUBCUTANEOUS
  Administered 2015-07-25: 2 [IU] via SUBCUTANEOUS
  Administered 2015-07-25: 4 [IU] via SUBCUTANEOUS
  Administered 2015-07-25 – 2015-07-26 (×3): 3 [IU] via SUBCUTANEOUS
  Administered 2015-07-26: 5 [IU] via SUBCUTANEOUS
  Administered 2015-07-26 (×3): 3 [IU] via SUBCUTANEOUS
  Administered 2015-07-27: 2 [IU] via SUBCUTANEOUS
  Administered 2015-07-27: 5 [IU] via SUBCUTANEOUS
  Administered 2015-07-27 (×3): 3 [IU] via SUBCUTANEOUS
  Administered 2015-07-27 (×2): 2 [IU] via SUBCUTANEOUS

## 2015-07-19 MED ORDER — PIPERACILLIN-TAZOBACTAM 3.375 G IVPB
3.3750 g | Freq: Three times a day (TID) | INTRAVENOUS | Status: AC
  Administered 2015-07-19 – 2015-07-28 (×28): 3.375 g via INTRAVENOUS
  Filled 2015-07-19 (×29): qty 50

## 2015-07-19 MED ORDER — CEFOXITIN SODIUM 2 G IV SOLR
2.0000 g | INTRAVENOUS | Status: DC | PRN
  Administered 2015-07-19: 2 g via INTRAVENOUS

## 2015-07-19 MED ORDER — PANTOPRAZOLE SODIUM 40 MG IV SOLR
40.0000 mg | INTRAVENOUS | Status: DC
  Administered 2015-07-19 – 2015-07-24 (×5): 40 mg via INTRAVENOUS
  Filled 2015-07-19 (×5): qty 40

## 2015-07-19 MED ORDER — ROCURONIUM BROMIDE 100 MG/10ML IV SOLN
INTRAVENOUS | Status: DC | PRN
  Administered 2015-07-19: 20 mg via INTRAVENOUS
  Administered 2015-07-19: 30 mg via INTRAVENOUS

## 2015-07-19 MED ORDER — PHENYLEPHRINE HCL 10 MG/ML IJ SOLN
10.0000 mg | INTRAVENOUS | Status: DC | PRN
  Administered 2015-07-19: 10 ug/min via INTRAVENOUS

## 2015-07-19 MED ORDER — FENTANYL CITRATE (PF) 100 MCG/2ML IJ SOLN
50.0000 ug | INTRAMUSCULAR | Status: AC | PRN
  Administered 2015-07-22 – 2015-07-26 (×3): 50 ug via INTRAVENOUS
  Filled 2015-07-19 (×5): qty 2

## 2015-07-19 MED ORDER — LEVOTHYROXINE SODIUM 100 MCG IV SOLR
56.0000 ug | Freq: Every day | INTRAVENOUS | Status: DC
  Administered 2015-07-19 – 2015-07-29 (×11): 56 ug via INTRAVENOUS
  Filled 2015-07-19 (×11): qty 5

## 2015-07-19 MED ORDER — PROPOFOL 1000 MG/100ML IV EMUL
5.0000 ug/kg/min | INTRAVENOUS | Status: DC
  Administered 2015-07-19: 30 ug/kg/min via INTRAVENOUS
  Administered 2015-07-19 – 2015-07-20 (×2): 20 ug/kg/min via INTRAVENOUS
  Administered 2015-07-20 – 2015-07-21 (×2): 30 ug/kg/min via INTRAVENOUS
  Administered 2015-07-22: 15 ug/kg/min via INTRAVENOUS
  Administered 2015-07-22: 10 ug/kg/min via INTRAVENOUS
  Administered 2015-07-22: 20 ug/kg/min via INTRAVENOUS
  Filled 2015-07-19 (×10): qty 100

## 2015-07-19 MED ORDER — 0.9 % SODIUM CHLORIDE (POUR BTL) OPTIME
TOPICAL | Status: DC | PRN
  Administered 2015-07-19: 6000 mL

## 2015-07-19 MED ORDER — SODIUM CHLORIDE 0.9 % IV BOLUS (SEPSIS)
1000.0000 mL | Freq: Once | INTRAVENOUS | Status: AC
  Administered 2015-07-19: 1000 mL via INTRAVENOUS

## 2015-07-19 MED ORDER — PROPOFOL 10 MG/ML IV BOLUS
INTRAVENOUS | Status: DC | PRN
  Administered 2015-07-19: 100 mg via INTRAVENOUS

## 2015-07-19 MED ORDER — LACTATED RINGERS IV SOLN
INTRAVENOUS | Status: DC | PRN
  Administered 2015-07-19: 06:00:00 via INTRAVENOUS

## 2015-07-19 MED ORDER — FENTANYL CITRATE (PF) 100 MCG/2ML IJ SOLN
50.0000 ug | INTRAMUSCULAR | Status: DC | PRN
  Administered 2015-07-19 – 2015-07-28 (×21): 50 ug via INTRAVENOUS
  Filled 2015-07-19 (×20): qty 2

## 2015-07-19 MED ORDER — POTASSIUM CHLORIDE 10 MEQ/50ML IV SOLN
10.0000 meq | INTRAVENOUS | Status: AC
  Administered 2015-07-19 (×5): 10 meq via INTRAVENOUS
  Filled 2015-07-19 (×5): qty 50

## 2015-07-19 MED ORDER — ONDANSETRON 4 MG PO TBDP
4.0000 mg | ORAL_TABLET | Freq: Four times a day (QID) | ORAL | Status: DC | PRN
  Filled 2015-07-19: qty 1

## 2015-07-19 MED ORDER — SODIUM BICARBONATE 8.4 % IV SOLN
INTRAVENOUS | Status: DC | PRN
  Administered 2015-07-19 (×2): 50 meq via INTRAVENOUS

## 2015-07-19 MED ORDER — ONDANSETRON HCL 4 MG/2ML IJ SOLN: 4.0000 mg | Freq: Four times a day (QID) | INTRAMUSCULAR | Status: DC | PRN

## 2015-07-19 MED ORDER — ANTISEPTIC ORAL RINSE SOLUTION (CORINZ)
7.0000 mL | Freq: Four times a day (QID) | OROMUCOSAL | Status: DC
  Administered 2015-07-19 – 2015-07-27 (×33): 7 mL via OROMUCOSAL

## 2015-07-19 MED ORDER — FENTANYL CITRATE (PF) 250 MCG/5ML IJ SOLN
INTRAMUSCULAR | Status: DC | PRN
  Administered 2015-07-19 (×8): 50 ug via INTRAVENOUS
  Administered 2015-07-19: 100 ug via INTRAVENOUS

## 2015-07-19 MED ORDER — MIDAZOLAM HCL 5 MG/5ML IJ SOLN
INTRAMUSCULAR | Status: DC | PRN
  Administered 2015-07-19: 2 mg via INTRAVENOUS

## 2015-07-19 MED ORDER — MIDAZOLAM HCL 2 MG/2ML IJ SOLN
INTRAMUSCULAR | Status: AC
  Filled 2015-07-19: qty 2

## 2015-07-19 MED ORDER — PROPOFOL 500 MG/50ML IV EMUL
INTRAVENOUS | Status: DC | PRN
  Administered 2015-07-19: 30 ug/kg/min via INTRAVENOUS

## 2015-07-19 MED ORDER — MAGNESIUM SULFATE 2 GM/50ML IV SOLN
2.0000 g | Freq: Once | INTRAVENOUS | Status: AC
  Administered 2015-07-19: 2 g via INTRAVENOUS
  Filled 2015-07-19: qty 50

## 2015-07-19 MED ORDER — DEXTROSE 5 % IV SOLN
INTRAVENOUS | Status: AC
  Administered 2015-07-19: 2 g via INTRAVENOUS
  Filled 2015-07-19 (×2): qty 1

## 2015-07-19 MED ORDER — HEPARIN SODIUM (PORCINE) 5000 UNIT/ML IJ SOLN
5000.0000 [IU] | Freq: Three times a day (TID) | INTRAMUSCULAR | Status: DC
  Administered 2015-07-20 – 2015-07-29 (×26): 5000 [IU] via SUBCUTANEOUS
  Filled 2015-07-19 (×26): qty 1

## 2015-07-19 SURGICAL SUPPLY — 37 items
BLADE SURG ROTATE 9660 (MISCELLANEOUS) IMPLANT
CANISTER SUCTION 2500CC (MISCELLANEOUS) ×3 IMPLANT
CHLORAPREP W/TINT 26ML (MISCELLANEOUS) ×3 IMPLANT
COVER SURGICAL LIGHT HANDLE (MISCELLANEOUS) ×3 IMPLANT
DRAPE LAPAROSCOPIC ABDOMINAL (DRAPES) ×3 IMPLANT
DRAPE WARM FLUID 44X44 (DRAPE) ×3 IMPLANT
DRSG OPSITE POSTOP 4X10 (GAUZE/BANDAGES/DRESSINGS) IMPLANT
DRSG OPSITE POSTOP 4X8 (GAUZE/BANDAGES/DRESSINGS) IMPLANT
ELECT BLADE 6.5 EXT (BLADE) IMPLANT
ELECT CAUTERY BLADE 6.4 (BLADE) ×3 IMPLANT
ELECT REM PT RETURN 9FT ADLT (ELECTROSURGICAL) ×3
ELECTRODE REM PT RTRN 9FT ADLT (ELECTROSURGICAL) ×1 IMPLANT
GLOVE BIO SURGEON STRL SZ8 (GLOVE) ×3 IMPLANT
GLOVE BIOGEL PI IND STRL 8 (GLOVE) ×1 IMPLANT
GLOVE BIOGEL PI INDICATOR 8 (GLOVE) ×2
GOWN STRL REUS W/ TWL LRG LVL3 (GOWN DISPOSABLE) ×1 IMPLANT
GOWN STRL REUS W/ TWL XL LVL3 (GOWN DISPOSABLE) ×1 IMPLANT
GOWN STRL REUS W/TWL LRG LVL3 (GOWN DISPOSABLE) ×3
GOWN STRL REUS W/TWL XL LVL3 (GOWN DISPOSABLE) ×3
KIT BASIN OR (CUSTOM PROCEDURE TRAY) ×3 IMPLANT
KIT ROOM TURNOVER OR (KITS) ×3 IMPLANT
LIGASURE IMPACT 36 18CM CVD LR (INSTRUMENTS) ×2 IMPLANT
NS IRRIG 1000ML POUR BTL (IV SOLUTION) ×6 IMPLANT
PACK GENERAL/GYN (CUSTOM PROCEDURE TRAY) ×3 IMPLANT
PAD ARMBOARD 7.5X6 YLW CONV (MISCELLANEOUS) ×3 IMPLANT
PAD NEG PRESSURE SENSATRAC (MISCELLANEOUS) ×2 IMPLANT
RELOAD PROXIMATE 75MM BLUE (ENDOMECHANICALS) ×3 IMPLANT
RELOAD STAPLE 75 3.8 BLU REG (ENDOMECHANICALS) IMPLANT
SPONGE ABDOMINAL VAC ABTHERA (MISCELLANEOUS) ×2 IMPLANT
SPONGE LAP 18X18 X RAY DECT (DISPOSABLE) IMPLANT
STAPLER PROXIMATE 75MM BLUE (STAPLE) ×2 IMPLANT
STAPLER VISISTAT 35W (STAPLE) ×3 IMPLANT
SUCTION POOLE TIP (SUCTIONS) ×3 IMPLANT
SUT VIC AB 3-0 SH 18 (SUTURE) ×3 IMPLANT
SUT VICRYL AB 2 0 TIES (SUTURE) ×3 IMPLANT
SUT VICRYL AB 3 0 TIES (SUTURE) ×3 IMPLANT
TOWEL OR 17X26 10 PK STRL BLUE (TOWEL DISPOSABLE) ×3 IMPLANT

## 2015-07-19 NOTE — ED Provider Notes (Signed)
Medical screening exam:  Patient transferred from [facility] for evaluation of perforated bowel. Patient was recently hospitalized here for small bowel obstruction requiring surgical intervention. Obstruction was secondary to tumor. Patient presented to [facility] with nausea, vomiting, abdominal pain and CT scan showed evidence of perforated bowel. Patient transferred here for surgical care.   Patient briefly evaluated. She is in no acute distress at this time. She is currently being evaluated by Dr. Brantley Stage for surgery.  Orpah Greek, MD 07/19/15 [phone]

## 2015-07-19 NOTE — H&P (Signed)
Monica Morrison is an 70 y.o. female.   Chief Complaint: abdominal pain HPI: patient is transferred from the [facility] secondary to abdominal pain. She was a patient underwent a small bowel resection on 07/10/2015 by Dr. Grandville Silos for a malignant small bowel obstruction secondary to metastatic breast cancer. She was discharged to a skilled nursing facility 3 days ago  But developed abdominal pain yesterday which became severe. CT scan showed free air and a large quantity of free fluid and oral contrast. She was transferred to [facility]: Since this is where her original surgery was.  Past Medical History  Diagnosis Date  . Cancer (HCC)     breast  . Hypertension   . Diabetes mellitus   . Arthritis   . Breast cancer ([place])   . CKD (chronic kidney disease)   . Hypothyroidism 06/22/2015    Past Surgical History  Procedure Laterality Date  . Abdominal hysterectomy    . Breast surgery    . Laparotomy N/A 07/10/2015    Procedure: EXPLORATORY LAPAROTOMY ;  Surgeon: Georganna Skeans, MD;  Location: [place];  Service: General;  Laterality: N/A;  . Bowel resection N/A 07/10/2015    Procedure: SMALL BOWEL RESECTION;  Surgeon: Georganna Skeans, MD;  Location: [facility];  Service: General;  Laterality: N/A;    Family History  Problem Relation Age of Onset  . Diabetes Mother   . Hypertension Mother   . Diabetes Sister   . Hypertension Sister    Social History:  reports that she has never smoked. She does not have any smokeless tobacco history on file. She reports that she does not drink alcohol or use illicit drugs.  Allergies: No Known Allergies   (Not in a hospital admission)  No results found for this or any previous visit (from the past 48 hour(s)). No results found.  Review of Systems  Constitutional: Positive for malaise/fatigue.  HENT: Negative.   Eyes: Negative.   Respiratory: Negative.   Cardiovascular: Negative.   Gastrointestinal: Positive for nausea, vomiting and  abdominal pain.  Musculoskeletal: Positive for myalgias.  Neurological: Positive for weakness.  Psychiatric/Behavioral: Positive for depression.    Blood pressure 147/99, pulse 129, temperature 98 F (36.7 C), temperature source Oral, resp. rate 20, height 5\' 4"  (1.626 m), weight 75.751 kg (167 lb), SpO2 99 %. Physical Exam  Constitutional: She appears distressed.  HENT:  Head: Normocephalic.  Neck: Normal range of motion.  Cardiovascular: Normal rate and regular rhythm.   Respiratory: Effort normal and breath sounds normal.  GI: There is tenderness in the right upper quadrant, right lower quadrant, left upper quadrant and left lower quadrant. There is rebound and guarding. There is no rigidity.    Musculoskeletal: Normal range of motion.  Neurological: She is alert.     Assessment/Plan Perforated viscus secondary to small bowel resection with anastomotic leak  Patient Active Problem List   Diagnosis Date Noted  . Hypertension 06/30/2015  . Hypokalemia 06/30/2015  . Dehydration 06/30/2015  . Small bowel obstruction ([place]) 06/29/2015  . Hypothyroidism 06/22/2015  . Campylobacter enteritis 06/18/2015  . LFT elevation 06/18/2015  . CKD (chronic kidney disease) stage 3, GFR 30-59 ml/min 06/18/2015  . Acute-on-chronic kidney injury ([place]) 06/18/2015  . Chronic kidney disease 06/17/2015  . Diabetes mellitus with complication ([place])   . Malignant neoplasm of female breast ([place])   . SBO (small bowel obstruction) ([place]) 06/15/2015  . Breast cancer of lower-outer quadrant of left female breast ([place]) 03/07/2012    Discussed  options with patient and given the fact she has peritonitis recommended exploratory laparotomy.  She may require open abdomen, an ostomy, and prolonged ICU care with intubation. 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 I do not feel this can be managed nonoperatively  Given her findings on physical examination and CT scan. Prognosis  is poor  But this would improve her quality of life and get her back out-of-hospital.The procedure has been discussed with the patient.  Alternative therapies have been discussed with the patient.  Operative risks include bleeding,  Infection,  Organ injury,  Nerve injury,  Blood vessel injury,  DVT,  Pulmonary embolism,  Death,  And possible reoperation, bowel injury, ostomy, open wound  Medical management risks include worsening of present situation.  The success of the procedure is 50 -90 % at treating patients symptoms.  The patient understands and agrees to proceed.  Safwan Tomei A., MD 07/19/2015, 5:24 AM

## 2015-07-19 NOTE — Anesthesia Preprocedure Evaluation (Addendum)
Anesthesia Evaluation  Patient identified by MRN, date of birth, ID band Patient awake    Reviewed: Allergy & Precautions, NPO status , Patient's Chart, lab work & pertinent test results, reviewed documented beta blocker date and time   History of Anesthesia Complications Negative for: history of anesthetic complications  Airway Mallampati: II  TM Distance: >3 FB Neck ROM: Full    Dental  (+) Implants, Dental Advisory Given   Pulmonary neg pulmonary ROS,    breath sounds clear to auscultation       Cardiovascular hypertension, Pt. on medications and Pt. on home beta blockers (-) angina Rhythm:Regular Rate:Tachycardia  '06 ECHO: EF 55-60%, valves OK   Neuro/Psych negative neurological ROS  negative psych ROS   GI/Hepatic Neg liver ROS, H/o ex lap 07/10/15 for SBO, now with perf viscus   Endo/Other  diabetes, Oral Hypoglycemic AgentsHypothyroidism   Renal/GU Renal Insufficiency     Musculoskeletal  (+) Arthritis ,   Abdominal   Peds  Hematology  (+) Blood dyscrasia (Hb 9.1), ,   Anesthesia Other Findings Breast cancer: surgery and chemo Endometrial cancer: surgery  Reproductive/Obstetrics                           Anesthesia Physical Anesthesia Plan  ASA: III and emergent  Anesthesia Plan: General   Post-op Pain Management:    Induction: Intravenous and Rapid sequence  Airway Management Planned: Oral ETT  Additional Equipment:   Intra-op Plan:   Post-operative Plan: Possible Post-op intubation/ventilation  Informed Consent: I have reviewed the patients History and Physical, chart, labs and discussed the procedure including the risks, benefits and alternatives for the proposed anesthesia with the patient or authorized representative who has indicated his/her understanding and acceptance.   Dental advisory given  Plan Discussed with: CRNA and Surgeon  Anesthesia Plan Comments:  (Plan routine monitors, GETA)        Anesthesia Quick Evaluation

## 2015-07-19 NOTE — Addendum Note (Signed)
Addendum  created 07/19/15 0948 by Roberts Gaudy, MD   Modules edited: Anesthesia Attestations, Anesthesia LDA, Lines/Drains/Airways Properties Editor   Lines/Drains/Airways Properties Editor:  Properties of line/drain/airway/wound CVC Double Lumen 07/19/15 Right Internal jugular have been modified.; Properties of line/drain/airway/wound Arterial Line 07/19/15 Left Femoral have been modified.

## 2015-07-19 NOTE — OR Nursing (Signed)
Dr. Linna Caprice inserted IJ line  on right side and inserted  left femoral line

## 2015-07-19 NOTE — Anesthesia Postprocedure Evaluation (Signed)
Anesthesia Post Note  Patient: Public affairs consultant  Procedure(s) Performed: Procedure(s) (LRB): EXPLORATORY LAPAROTOMY, SMALL BOWEL ANASTOMOSIS RESECTION (N/A)  Patient location during evaluation: ICU Anesthesia Type: General Level of consciousness: sedated and patient remains intubated per anesthesia plan Vital Signs Assessment: post-procedure vital signs reviewed and stable Respiratory status: patient remains intubated per anesthesia plan and patient on ventilator - see flowsheet for VS Cardiovascular status: stable Anesthetic complications: no    Last Vitals:  Filed Vitals:   07/19/15 0455 07/19/15 0820  BP: 147/99   Pulse: 129 88  Temp: 36.7 C   Resp: 20 16    Last Pain:  Filed Vitals:   07/19/15 0822  PainSc: 9                  Nazareth Kirk

## 2015-07-19 NOTE — Consult Note (Signed)
PULMONARY / CRITICAL CARE MEDICINE   Name: Monica Morrison MRN: [id number] DOB: [birth year]    ADMISSION DATE:  07/19/2015 CONSULTATION DATE:  07/19/15  REFERRING MD:  Dr. Brantley Stage   CHIEF COMPLAINT:  Malignant Small Bowel Obstruction  HISTORY OF PRESENT ILLNESS:  [name] with PMH of HTN, DM II, CKD, HTN, arthritis, hypothyroidism, breast cancer and recent admission from 5/19-5/24 for a small bowel obstruction that was found to be metastatic carcinoma s/p exploratory lap / small bowel resection who presented to [facility] on 5/28 with complaints of severe abdominal pain.    The patient was seen in the ER at [facility] for evaluation of abdominal pain.  Initial CT of the abdomen showed free air, a large quantity of free fluid and oral contrast.  Labs - Na 140, K 3.3, Hgb 13.3 / Hct 39, ionized calcium 1.0.  She was transferred to [facility] for operative evaluation. The patient was taken urgently to the OR for exploratory laparotomy with small bowel anastomosis resection.  She returned to the ICU post-operatively on mechanical ventilation.  Post-operatively, she was noted to have runs of SVT & a right mainstem intubation.  Repeat labs pending.    PCCM consulted for ventilator management.     PAST MEDICAL HISTORY :  She  has a past medical history of Cancer ([place]); Hypertension; Diabetes mellitus; Arthritis; Breast cancer ([place]); CKD (chronic kidney disease); and Hypothyroidism (06/22/2015).  PAST SURGICAL HISTORY: She  has past surgical history that includes Abdominal hysterectomy; Breast surgery; laparotomy (N/A, 07/10/2015); and Bowel resection (N/A, 07/10/2015).  No Known Allergies  No current facility-administered medications on file prior to encounter.   Current Outpatient Prescriptions on File Prior to Encounter  Medication Sig  . alendronate (FOSAMAX) 70 MG tablet Take 70 mg by mouth every 7 (seven) days. On Sunday  . ALPRAZolam (XANAX) 0.25 MG tablet Take 1 tablet (0.25 mg total) by mouth at  bedtime as needed.  Marland Kitchen atorvastatin (LIPITOR) 20 MG tablet Take 20 mg by mouth daily.  Marland Kitchen bismuth subsalicylate (PEPTO BISMOL) 262 MG/15ML suspension Take 15 mLs by mouth 2 (two) times daily as needed for diarrhea or loose stools.  . Calcium Carbonate (CALCIUM 500 PO) Take 500 mg by mouth daily.   Marland Kitchen escitalopram (LEXAPRO) 10 MG tablet Take 10 mg by mouth daily.   Marland Kitchen glipiZIDE (GLUCOTROL) 5 MG tablet Take 10 mg by mouth daily.   Marland Kitchen levothyroxine (SYNTHROID, LEVOTHROID) 112 MCG tablet Take 112 mcg by mouth daily.    . metoprolol tartrate (LOPRESSOR) 25 MG tablet Take 1 tablet (25 mg total) by mouth 2 (two) times daily.  . Multiple Vitamin (MULTIVITAMIN WITH MINERALS) TABS tablet Take 1 tablet by mouth daily.  Marland Kitchen omeprazole (PRILOSEC) 20 MG capsule Take 20 mg by mouth daily.   . ONE TOUCH ULTRA TEST test strip   . pioglitazone (ACTOS) 15 MG tablet Take 15 mg by mouth daily.    . Protein (PROCEL) POWD Take 2 scoop by mouth 2 (two) times daily.  Marland Kitchen saccharomyces boulardii (FLORASTOR) 250 MG capsule Take 250 mg by mouth daily. Stop date 07/31/15    FAMILY HISTORY:  Her indicated that her mother is deceased. She indicated that her father is deceased. She indicated that her sister is alive.   SOCIAL HISTORY: She  reports that she has never smoked. She does not have any smokeless tobacco history on file. She reports that she does not drink alcohol or use illicit drugs.  SUBJECTIVE:   REVIEW OF  SYSTEMS:  Unable to complete as patient is on mechanical ventilation   VITAL SIGNS: BP 147/99 mmHg  Pulse 88  Temp(Src) 97.2 F (36.2 C) (Axillary)  Resp 16  Ht 5\' 4"  (1.626 m)  Wt 167 lb (75.751 kg)  BMI 28.65 kg/m2  SpO2 100%  HEMODYNAMICS:    VENTILATOR SETTINGS: Vent Mode:  [-] PRVC FiO2 (%):  [100 %] 100 % Set Rate:  [16 bmp] 16 bmp Vt Set:  [430 mL] 430 mL PEEP:  [5 cmH20] 5 cmH20 Plateau Pressure:  [18 cmH20] 18 cmH20  INTAKE / OUTPUT:    PHYSICAL EXAMINATION: General:  Well developed  elderly female in NAD Neuro:  Sedate on mechanical ventilation  HEENT:  ETT, MM pink/dry, no jvd  Cardiovascular:  s1s2 rrr, brief runs of SVT, no m/r/g  Lungs:  Even/non-labored, lungs bilaterally diminished but clear  Abdomen:  Soft, midline VAC in place with serosanguinous drainage  Musculoskeletal:  No acute deformities  Skin:  Warm/dry, 1+ BLE pitting edema   LABS:  BMET  Recent Labs Lab 07/13/15 0533  07/14/15 0430 07/15/15 0501 07/19/15 0635 07/19/15 0753  NA 134*  --  138 139 139 140  K 6.9*  < > 4.0 3.7 4.5 3.3*  CL 109  --  109 109  --   --   CO2 18*  --  20* 23  --   --   BUN 16  --  15 12  --   --   CREATININE 1.03*  --  0.88 0.93  --   --   GLUCOSE 93  --  163* 151* 176*  --   < > = values in this interval not displayed.  Electrolytes  Recent Labs Lab 07/13/15 0533 07/14/15 0430 07/15/15 0501  CALCIUM 7.2* 8.2* 8.2*  MG 1.6*  --   --     CBC  Recent Labs Lab 07/13/15 0533 07/14/15 0430 07/15/15 0501 07/19/15 0635 07/19/15 0753  WBC 11.5* 10.7* 6.9  --   --   HGB 8.4* 9.7* 9.1* 13.3 10.2*  HCT 27.7* 32.2* 30.2* 39.0 30.0*  PLT 222 302 292  --   --     Coag's No results for input(s): APTT, INR in the last 168 hours.  Sepsis Markers No results for input(s): LATICACIDVEN, PROCALCITON, O2SATVEN in the last 168 hours.  ABG  Recent Labs Lab 07/19/15 0753  PHART 7.152*  PCO2ART 47.8*  PO2ART 373.0*    Liver Enzymes No results for input(s): AST, ALT, ALKPHOS, BILITOT, ALBUMIN in the last 168 hours.  Cardiac Enzymes No results for input(s): TROPONINI, PROBNP in the last 168 hours.  Glucose  Recent Labs Lab 07/15/15 1740 07/15/15 1958 07/16/15 0013 07/16/15 0439 07/16/15 0734 07/19/15 0844  GLUCAP 194* 171* 182* 178* 118* 192*    Imaging Dg Chest Port 1 View  07/19/2015  CLINICAL DATA:  Encounter for central line placement. EXAM: PORTABLE CHEST 1 VIEW COMPARISON:  Jul 10, 2015. FINDINGS: Stable cardiomediastinal silhouette.  Interval placement of endotracheal tube which is directed into right mainstem bronchus; withdrawal by 2-3 cm is recommended. Interval placement of nasogastric tube which is seen entering stomach. Interval placement of right internal jugular catheter with distal tip in right atrium. Withdrawal by 2-3 cm is recommended as well. No pneumothorax or significant pleural effusion is noted. Mild right basilar subsegmental atelectasis is noted. Narrowing of the subacromial space bilaterally consistent with rotator cuff injury. IMPRESSION: Endotracheal tube is directed into right mainstem bronchus and withdrawal by 2-3  cm is recommended. Interval placement of right internal jugular catheter with distal tip in right atrium, and withdrawal by 2-3 cm is recommended. Critical Value/emergent results were called by telephone at the time of interpretation on 07/19/2015 at 9:17 am to Benjaman Pott, the patient's nurse, who verbally acknowledged these results and will contact the patient's physician. Electronically Signed   By: Marijo Conception, M.D.   On: 07/19/2015 09:18     STUDIES:  CT ABD/Pelvis 5/28 (OSH) >> bowel perforation with large volume free air, free fluid, extraluminal enteric contrast in the pelvis, known intra-abdominal metastatic disease, bilateral adrenal masses, liver lesion on prior exam is not as well visualized, cholelithiasis with gallbladder distention.     MICROBIOLOGY: MRSA PCR 5/28:  Negative   ANTIBIOTICS: Zosyn 5/28 >>  Diflucan 5/28 (perf) >>   SIGNIFICANT EVENTS: 5/28  Re-Admit with abd pain, found to have perforation of anastomosis. Returned to ICU on vent post-op  LINES/TUBES: ETT 5/28 >>  R IJ CVC 5/28 >>   DISCUSSION: [name] with PMH of breast cancer with recent findings of metastatic carcinoma / small bowel obstruction with ex-lap and resection on 5/19.  Returns to [facility] on 5/28 with perforation of anastomosis with free air / fluid. To OR 5/28 for repeat ex-lap and resection of  anastomosis.   ASSESSMENT / PLAN:  PULMONARY A: Acute Respiratory Insufficiency - secondary to metastatic carcinoma / bowel obstruction  P:   PRVC 8 cc/kg  Wean PEEP / FiO2 for sats > 92% Increase RR to 20  Intermittent CXR   CARDIOVASCULAR A:  ST / SVT  Hx HTN P:  Assess EKG now  Trend troponin  Assess CVP Q4  ICU monitoring  NS bolus x1 L, then 50 ml/hr  If SVT does not resolve with IVF's, may need to adjust central line (16 cm catheter, R IJ) Hold home lopressor, lipitor  DNR   RENAL A:   Acute Renal Failure - in setting of volume depletion, peritonitis, hypotension  Metabolic Acidosis Hypokalemia  P:   Trend BMP / UOP  Replace electrolytes as indicated, 50 mEq KCL + 2gm Mg 5/28  GASTROINTESTINAL A:   Metastatic Carcinoma with Small Bowel Obstruction  P:   Defer post operative management to CCS PPI NPO  HEMATOLOGIC A:   Anemia - in setting of malignancy, post-op Hx Breast Cancer  P:  Monitor CBC Transfuse per ICU guidelines Heparin [place] q8hr SCDs  INFECTIOUS A:   Peritonitis - s/p perforation of anastomosis in the setting of metastatic carcinoma  P:   ABX / Antifungals as above  Monitor fever curve / WBC   ENDOCRINE A:   Hypothyroidism  DM II    P:   Hold home glipizide, actos CBG Q4 with SSI  Continue synthroid, adjust to IV dosing  Assess TSH with SVT   NEUROLOGIC A:   Acute Encephalopathy - post op small bowel resection P:   RASS goal: -1 Propofol gtt for sedation  PRN fentanyl for pain    FAMILY  - Updates:  No family at bedside am 5/28.    - Inter-disciplinary family meet or Palliative Care meeting due by:  6/3   Noe Gens, NP-C [facility] Pgr: [phone] or if no answer [phone] 07/19/2015, 11:48 AM  PCCM Attending Note: Patient seen and examined with nurse practitioner. Please refer to her consultation note which I reviewed in detail. Plan to administer additional 1L of LR given CVP <10 and low  urine output. Continuing to wean FiO2 for Sat >94% and able to go to 0.4. Good lung compliance. She is wiggling toes on command. Hypokalemia replaced. Continuing to monitor UOP. Trending electrolytes.   I have spent a total of 32 minutes of critical care time caring for the patient and reviewing the patient's electronic medical record.  Sonia Baller Ashok Cordia, M.D. [facility] Pager:  [phone] After 3pm or if no response, call [phone] 4:06 PM 07/19/2015

## 2015-07-19 NOTE — Transfer of Care (Signed)
Immediate Anesthesia Transfer of Care Note  Patient: Public affairs consultant  Procedure(s) Performed: Procedure(s): EXPLORATORY LAPAROTOMY, SMALL BOWEL ANASTOMOSIS RESECTION (N/A)  Patient Location: ICU  Anesthesia Type:General  Level of Consciousness: sedated, unresponsive and Patient remains intubated per anesthesia plan  Airway & Oxygen Therapy: Patient remains intubated per anesthesia plan and Patient placed on Ventilator (see vital sign flow sheet for setting)  Post-op Assessment: Report given to RN and Post -op Vital signs reviewed and stable  Post vital signs: Reviewed and stable  Last Vitals:  Filed Vitals:   07/19/15 0455 07/19/15 0820  BP: 147/99   Pulse: 129 88  Temp: 36.7 C   Resp: 20 16    Last Pain:  Filed Vitals:   07/19/15 0822  PainSc: 9          Complications: No apparent anesthesia complications

## 2015-07-19 NOTE — ED Notes (Signed)
Pt comes from [facility] for perforated bowel. Pt recently had surgery here, Dr. Brantley Stage admitting surgeon here.

## 2015-07-19 NOTE — ED Notes (Signed)
PTA pt received 12mg  zofran, 100 mcg of fentanyl.

## 2015-07-19 NOTE — Progress Notes (Signed)
Pulled ET tube back 2 cm, per order, from 20 cm to 18 cm at the lip.  Will continue to monitor.

## 2015-07-19 NOTE — Anesthesia Procedure Notes (Signed)
Procedure Name: Intubation Date/Time: 07/19/2015 6:41 AM Performed by: [name] D Pre-anesthesia Checklist: Patient identified, Emergency Drugs available, Suction available, Patient being monitored and Timeout performed Patient Re-evaluated:Patient Re-evaluated prior to inductionOxygen Delivery Method: Circle system utilized Preoxygenation: Pre-oxygenation with 100% oxygen Intubation Type: IV induction, Rapid sequence and Cricoid Pressure applied Laryngoscope Size: Glidescope Grade View: Grade I Tube type: Subglottic suction tube Tube size: 7.5 mm Number of attempts: 1 Airway Equipment and Method: Stylet and Video-laryngoscopy Placement Confirmation: ETT inserted through vocal cords under direct vision,  positive ETCO2 and breath sounds checked- equal and bilateral Secured at: 20 cm Tube secured with: Tape Dental Injury: Teeth and Oropharynx as per pre-operative assessment

## 2015-07-19 NOTE — Brief Op Note (Signed)
07/19/2015  7:30 AM  PATIENT:  Liliana Cline  70 y.o. female  PRE-OPERATIVE DIAGNOSIS:  perforated viscous  POST-OPERATIVE DIAGNOSIS:  perforated viscous  PROCEDURE:  Procedure(s): EXPLORATORY LAPAROTOMY, SMALL BOWEL ANASTOMOSIS RESECTION (N/A)  SURGEON:  Surgeon(s) and Role:    * Erroll Luna, MD - Primary  PHYSICIAN ASSISTANT:   ASSISTANTS: none   ANESTHESIA:   general  EBL:  Total I/O In: 250 [IV Piggyback:250] Out: -   BLOOD ADMINISTERED:none  DRAINS: none   LOCAL MEDICATIONS USED:  NONE  SPECIMEN:  Source of Specimen:  small bowel   DISPOSITION OF SPECIMEN:  PATHOLOGY  COUNTS:  YES  TOURNIQUET:  * No tourniquets in log *  DICTATION: .Other Dictation: Dictation Number [id number]  PLAN OF CARE: Admit to inpatient   PATIENT DISPOSITION:  ICU - intubated and critically ill.   Delay start of Pharmacological VTE agent (>24hrs) due to surgical blood loss or risk of bleeding: no

## 2015-07-20 ENCOUNTER — Encounter (HOSPITAL_COMMUNITY): Payer: Self-pay | Admitting: Surgery

## 2015-07-20 ENCOUNTER — Other Ambulatory Visit: Payer: Self-pay

## 2015-07-20 ENCOUNTER — Inpatient Hospital Stay (HOSPITAL_COMMUNITY): Payer: Commercial Managed Care - HMO

## 2015-07-20 DIAGNOSIS — I4891 Unspecified atrial fibrillation: Secondary | ICD-10-CM

## 2015-07-20 LAB — RENAL FUNCTION PANEL
ALBUMIN: 1.5 g/dL — AB (ref 3.5–5.0)
ANION GAP: 8 (ref 5–15)
BUN: 21 mg/dL — ABNORMAL HIGH (ref 6–20)
CALCIUM: 6.5 mg/dL — AB (ref 8.9–10.3)
CO2: 19 mmol/L — AB (ref 22–32)
Chloride: 112 mmol/L — ABNORMAL HIGH (ref 101–111)
Creatinine, Ser: 1.88 mg/dL — ABNORMAL HIGH (ref 0.44–1.00)
GFR calc non Af Amer: 26 mL/min — ABNORMAL LOW (ref >60)
GFR, EST AFRICAN AMERICAN: 30 mL/min — AB (ref >60)
GLUCOSE: 219 mg/dL — AB (ref 65–99)
PHOSPHORUS: 4.3 mg/dL (ref 2.5–4.6)
POTASSIUM: 4.1 mmol/L (ref 3.5–5.1)
Sodium: 139 mmol/L (ref 135–145)

## 2015-07-20 LAB — COMPREHENSIVE METABOLIC PANEL
ALBUMIN: 1.5 g/dL — AB (ref 3.5–5.0)
ALT: 146 U/L — AB (ref 14–54)
AST: 218 U/L — AB (ref 15–41)
Alkaline Phosphatase: 133 U/L — ABNORMAL HIGH (ref 38–126)
Anion gap: 8 (ref 5–15)
BUN: 21 mg/dL — AB (ref 6–20)
CHLORIDE: 112 mmol/L — AB (ref 101–111)
CO2: 19 mmol/L — AB (ref 22–32)
CREATININE: 1.91 mg/dL — AB (ref 0.44–1.00)
Calcium: 6.4 mg/dL — CL (ref 8.9–10.3)
GFR calc Af Amer: 30 mL/min — ABNORMAL LOW (ref >60)
GFR calc non Af Amer: 25 mL/min — ABNORMAL LOW (ref >60)
Glucose, Bld: 224 mg/dL — ABNORMAL HIGH (ref 65–99)
POTASSIUM: 4.1 mmol/L (ref 3.5–5.1)
SODIUM: 139 mmol/L (ref 135–145)
Total Bilirubin: 0.4 mg/dL (ref 0.3–1.2)
Total Protein: 4.1 g/dL — ABNORMAL LOW (ref 6.5–8.1)

## 2015-07-20 LAB — GLUCOSE, CAPILLARY
GLUCOSE-CAPILLARY: 154 mg/dL — AB (ref 65–99)
GLUCOSE-CAPILLARY: 171 mg/dL — AB (ref 65–99)
Glucose-Capillary: 196 mg/dL — ABNORMAL HIGH (ref 65–99)
Glucose-Capillary: 210 mg/dL — ABNORMAL HIGH (ref 65–99)
Glucose-Capillary: 150 mg/dL — ABNORMAL HIGH (ref 65–99)
Glucose-Capillary: 150 mg/dL — ABNORMAL HIGH (ref 65–99)

## 2015-07-20 LAB — POCT I-STAT 3, ART BLOOD GAS (G3+)
Acid-base deficit: 6 mmol/L — ABNORMAL HIGH (ref 0.0–2.0)
Bicarbonate: 19.4 mEq/L — ABNORMAL LOW (ref 20.0–24.0)
O2 SAT: 99 %
PCO2 ART: 36.6 mmHg (ref 35.0–45.0)
PH ART: 7.333 — AB (ref 7.350–7.450)
TCO2: 21 mmol/L (ref 0–100)
pO2, Arterial: 156 mmHg — ABNORMAL HIGH (ref 80.0–100.0)

## 2015-07-20 LAB — CBC
HCT: 28.4 % — ABNORMAL LOW (ref 36.0–46.0)
Hemoglobin: 8.7 g/dL — ABNORMAL LOW (ref 12.0–15.0)
MCH: 25 pg — ABNORMAL LOW (ref 26.0–34.0)
MCHC: 30.6 g/dL (ref 30.0–36.0)
MCV: 81.6 fL (ref 78.0–100.0)
PLATELETS: 246 10*3/uL (ref 150–400)
RBC: 3.48 MIL/uL — AB (ref 3.87–5.11)
RDW: 18.1 % — AB (ref 11.5–15.5)
WBC: 8 10*3/uL (ref 4.0–10.5)

## 2015-07-20 LAB — MAGNESIUM: MAGNESIUM: 1.5 mg/dL — AB (ref 1.7–2.4)

## 2015-07-20 MED ORDER — METOPROLOL TARTRATE 5 MG/5ML IV SOLN
2.5000 mg | Freq: Once | INTRAVENOUS | Status: AC
  Administered 2015-07-20: 2.5 mg via INTRAVENOUS
  Filled 2015-07-20: qty 5

## 2015-07-20 MED ORDER — SODIUM CHLORIDE 0.9 % IV SOLN
INTRAVENOUS | Status: AC
  Administered 2015-07-21: 02:00:00 via INTRAVENOUS

## 2015-07-20 MED ORDER — SODIUM CHLORIDE 0.9 % IV BOLUS (SEPSIS)
500.0000 mL | Freq: Once | INTRAVENOUS | Status: AC
  Administered 2015-07-20: 500 mL via INTRAVENOUS

## 2015-07-20 MED ORDER — TRACE MINERALS CR-CU-MN-SE-ZN 10-1000-500-60 MCG/ML IV SOLN
INTRAVENOUS | Status: AC
  Administered 2015-07-20: 18:00:00 via INTRAVENOUS
  Filled 2015-07-20: qty 960

## 2015-07-20 MED ORDER — SODIUM CHLORIDE 0.9 % IV SOLN
INTRAVENOUS | Status: AC
  Administered 2015-07-20: 17:00:00 via INTRAVENOUS

## 2015-07-20 MED ORDER — PHENYLEPHRINE HCL 10 MG/ML IJ SOLN
0.0000 ug/min | INTRAVENOUS | Status: DC
  Administered 2015-07-20: 20 ug/min via INTRAVENOUS
  Filled 2015-07-20 (×2): qty 1

## 2015-07-20 MED ORDER — SODIUM CHLORIDE 0.9% FLUSH
10.0000 mL | Freq: Two times a day (BID) | INTRAVENOUS | Status: DC
  Administered 2015-07-20 – 2015-07-29 (×17): 10 mL

## 2015-07-20 MED ORDER — MAGNESIUM SULFATE 50 % IJ SOLN
3.0000 g | Freq: Once | INTRAVENOUS | Status: AC
  Administered 2015-07-20: 3 g via INTRAVENOUS
  Filled 2015-07-20: qty 6

## 2015-07-20 MED ORDER — PHENYLEPHRINE HCL 10 MG/ML IJ SOLN
0.0000 ug/min | INTRAVENOUS | Status: DC
  Administered 2015-07-20: 30 ug/min via INTRAVENOUS
  Filled 2015-07-20: qty 4

## 2015-07-20 MED ORDER — SODIUM CHLORIDE 0.9% FLUSH: 10.0000 mL | INTRAVENOUS | Status: DC | PRN

## 2015-07-20 NOTE — Op Note (Signed)
NAMELEOR, CASCELLA NO.:  [id number]  MEDICAL RECORD NO.:  [id number]  LOCATION:  2S07C                        FACILITY:  [facility]  PHYSICIAN:  Marcello Moores A. Ayelen Sciortino, M.D.DATE OF BIRTH:  03/31/1945  DATE OF PROCEDURE:  07/19/2015 DATE OF DISCHARGE:                              OPERATIVE REPORT   PREOPERATIVE DIAGNOSIS:  Peritonitis with perforated viscus.  POSTOPERATIVE DIAGNOSIS:  Breakdown of previous small bowel anastomosis with gross contamination and peritonitis of the abdominal cavity.  PROCEDURE:  Exploratory laparotomy with resection of previous anastomosis and placement of abdominal wound VAC.  SURGEON:  Marcello Moores A. Nyhla Mountjoy, M.D.  ANESTHESIA:  General endotracheal anesthesia.  EBL:  Minimal.  SPECIMEN:  Small bowel anastomosis to pathology.  DRAINS:  Wound VAC.  INDICATIONS FOR PROCEDURE:  The patient is a 70 year old female, who had a small bowel resection 9 days ago by Dr. Georganna Skeans.  She was discharged to skilled nursing, but developed abdominal pain, was seen in the [facility].  CT scan showed free air and a large amount of free fluid and she had peritonitis with extravasation from her anastomotic site.  She was transferred to [facility] for definitive care.  Upon arrival, she was hemodynamically stable.  We recommended emergent laparotomy, and she agreed.  We discussed the risks of surgery to include bleeding, infection, open abdomen, death, DVT, the need for other operative procedures, ICU care, bowel injury, bladder injury, the need for multiple operations, DVT, and an exacerbation of her underlying condition.  She has metastatic breast cancer.  She understood the need as well as discussion of nonoperative options, which were few.  She wished to proceed.  DESCRIPTION OF PROCEDURE:  The patient was brought emergently from the emergency room to the operating room.  She was placed supine where general anesthesia was  initiated.  A Foley catheter was placed in sterile conditions.  Her staples were removed from her midline incision. Her abdomen was then prepped and draped in sterile fashion.  Time-out was done.  She was placed on Zosyn.  After time-out, we opened the incision and copious amounts of succus port from the incision.  I was then able to find the fascia and remove the sutures.  Upon entering abdominal cavity, she had over 5 L of succus and bile-stained material. This was irrigated and suctioned out with about 10 L of irrigation.  The small bowel anastomosis was identified, and she had blown out the common enterotomy staple line site in the middle.  I placed a stitch to control drainage.  I then resected the anastomosis using GIA 75 staplers.  I left the ends closed.  Nasogastric tube was placed.  After copious irrigation, we were able to clear the abdomen of all material.  Given her swelling and poor quality of her abdominal wall.  I elected to place the wound VAC which I did.  This was placed in the standard fashion.  It was placed to 125 mm of suction.  All final counts were found to be correct. She will require a second-look operation in 48 hours.  She was then taken to ICU and intubated in critical condition.  All final counts  were found to be correct.     Parrish Daddario A. Quavon Keisling, M.D.     TAC/MEDQ  D:  07/19/2015  T:  07/20/2015  Job:  [id number]

## 2015-07-20 NOTE — Progress Notes (Signed)
Initial Nutrition Assessment  DOCUMENTATION CODES:   Not applicable  INTERVENTION:    TPN dosing per Pharmacy to meet nutrition needs as able.  NUTRITION DIAGNOSIS:   Inadequate oral intake related to inability to eat as evidenced by NPO status.  GOAL:   Patient will meet greater than or equal to 90% of their needs  MONITOR:   Vent status, Labs, Weight trends, Skin, I & O's  REASON FOR ASSESSMENT:   Ventilator (New TPN)    ASSESSMENT:   70 yo female admitted 5/28 with abdominal pain s/p SBR for malignant SB obstruction (metastatic breast CA) on 5/19 and d/c to SNF 3 days prior. She developed severe abdominal pain with CT (+)free air and free fluid. Pt is now s/p exp lap for perforated viscus with peritonitis- resection of anastamosis and placement of wound VAC.    Plans for return to OR on 5/30.  Nutrition-Focused physical exam completed. Findings are no fat depletion, mild muscle depletion (temples only), and moderate edema.  Weight up from admission weight, likely related to fluid overload / edema.  TPN being initiated today (expected prolonged ileus) with Clinimix E 5/15 at 40 ml/h (682 kcals, 48 gm protein). No lipids x 7 days due to critical illness.  Patient is currently intubated on ventilator support MV: 9.7 L/min Temp (24hrs), Avg:98.2 F (36.8 C), Min:97.4 F (36.3 C), Max:99.8 F (37.7 C)  Propofol: 9.1 ml/hr providing 240 kcals per day  Diet Order:  Diet NPO time specified .TPN (CLINIMIX-E) Adult  Skin:  Wound (see comment) (abdominal surgical incision with VAC)  Last BM:  unknown  Height:   Ht Readings from Last 1 Encounters:  07/19/15 5\' 4"  (1.626 m)    Weight:   Wt Readings from Last 1 Encounters:  07/20/15 192 lb 3.9 oz (87.2 kg)  Admission weight: 167 lb (75.751 kg); BMI=28.7  Ideal Body Weight:  54.5 kg  BMI:  Body mass index is 32.98 kg/(m^2).; 28.7 using admission weight  Estimated Nutritional Needs:   Kcal:  1602  Protein:   105-120 gm  Fluid:  1.7-1.9 L  EDUCATION NEEDS:   No education needs identified at this time  Molli Barrows, [place], [place], [place] Pager [phone] After Hours Pager [phone]

## 2015-07-20 NOTE — Progress Notes (Signed)
PARENTERAL NUTRITION CONSULT NOTE - INITIAL  Pharmacy Consult for TPN Indication: Prolonged ileus  No Known Allergies  Patient Measurements: Height: 5' 4"  (162.6 cm) Weight: 192 lb 3.9 oz (87.2 kg) IBW/kg (Calculated) : 54.7 Adjusted Body Weight: 74 Usual Weight: 76kg preop on 5/28  Vital Signs: Temp: 99.8 F (37.7 C) (05/29 0700) Temp Source: Oral (05/29 0700) BP: 114/71 mmHg (05/29 0700) Pulse Rate: 97 (05/29 0600) Intake/Output from previous day: 05/28 0701 - 05/29 0700 In: 5853.1 [I.V.:4783.1; NG/GT:120; IV Piggyback:950] Out: 1850 [Urine:275; Emesis/NG output:550; Drains:950; Blood:75] Intake/Output from this shift:    Labs:  Recent Labs  07/19/15 0753 07/19/15 1040 07/20/15 0501  WBC  --  5.7 8.0  HGB 10.2* 9.4* 8.7*  HCT 30.0* 31.0* 28.4*  PLT  --  276 246     Recent Labs  07/19/15 0635 07/19/15 0753 07/19/15 1040 07/19/15 1045 07/20/15 0501  NA 139 140 139  --  139  139  K 4.5 3.3* 2.9*  --  4.1  4.1  CL  --   --  112*  --  112*  112*  CO2  --   --  20*  --  19*  19*  GLUCOSE 176*  --  190*  --  224*  219*  BUN  --   --  17  --  21*  21*  CREATININE  --   --  1.59*  --  1.91*  1.88*  CALCIUM  --   --  6.6*  --  6.4*  6.5*  PHOS  --   --   --   --  4.3  PROT  --   --   --   --  4.1*  ALBUMIN  --   --   --   --  1.5*  1.5*  AST  --   --   --   --  218*  ALT  --   --   --   --  146*  ALKPHOS  --   --   --   --  133*  BILITOT  --   --   --   --  0.4  PREALBUMIN  --   --  2.5*  --   --   TRIG  --   --   --  60  --    Estimated Creatinine Clearance: 29.3 mL/min (by C-G formula based on Cr of 1.91).    Recent Labs  07/19/15 1549 07/19/15 2339 07/20/15 0351  GLUCAP 173* 220* 196*    Medical History: Past Medical History  Diagnosis Date  . Cancer (HCC)     breast  . Hypertension   . Diabetes mellitus   . Arthritis   . Breast cancer ([place])   . CKD (chronic kidney disease)   . Hypothyroidism 06/22/2015    Medications:   Scheduled:  . antiseptic oral rinse  7 mL Mouth Rinse QID  . chlorhexidine gluconate (SAGE KIT)  15 mL Mouth Rinse BID  . heparin subcutaneous  5,000 Units Subcutaneous Q8H  . insulin aspart  0-15 Units Subcutaneous Q4H  . levothyroxine  56 mcg Intravenous Daily  . pantoprazole (PROTONIX) IV  40 mg Intravenous Q24H  . piperacillin-tazobactam (ZOSYN)  IV  3.375 g Intravenous Q8H    Insulin Requirements in the past 24 hours:  21 units mod SSI in last 24hr  Current Nutrition:  NPO Propofol will provide 211 fat calories daily, at current rate.  Nutritional Goals:  Estimated 1800-2000 kCal, 105-115  grams of protein per day  Admit:  70yo female admitted 5/28 with abdominal pain s/p SBR for malignant SB obstruction (metastatic breast CA) on 5/19 and d/c to SNF 3 days prior.  She developed severe abdominal pain with CT (+)free air and free fluid.  Pt is now s/p exp lap for perforated viscus with peritonitis- resection of anastamosis and placement of wound VAC. She is to return to OR 5/30.  Assessment:  Pt with mostly poor PO intake over 5/19 admission and expected same with development of abdominal discomfort and pain during her 3 days at SNF, as well as expectation for prolonged ileus.  Pt is to start TPN.  Surgeries/Procedures: 5/19  Small bowel resection for malignant SBO (primary: breast CA) 5/28  Emergent exp lap with resection of anastamosis, wound VAC  GI:  Wound VAC in place, abd soft, hypoactive BS. NG 532m, wound VAC 953m  Return to OR 5/30.  PPI-IV Endo:  Hx DM, hypothyroid.  Levothyroxine IV.  CBGs 173-220. Lytes:  K 4.1- s/p K runs x 5 yest, CoCa 8.4, Mg 1.5- Mg 3g IV today & s/p 2g IV on 5/28, Phos 4.3.  Increasing Cr, but requiring supplementation of electrolytes.  NS at 7568mr Renal:  Acute on chronic kidney disease.  Cr 1.91 (1.2 on admit 5/19).  UOP 275m45mhr.  I/O 3.8L Pulm:  Intubated Cards:  Hx HTN.  AFib with RVR.   Hepatobil:  AlkPhos and transaminases  elevated, TBili wnl.  TG 60, PAlb 2.5 Neuro:  Sedated.  Propofol 8ml/77m(15mcg20mmin), Fent PRN ID:   Zosyn for peritonitis.  WBC 8  Best Practices:  Heparin SQ, mouthcare TPN Access:  CVL 5/28 TPN start date:  5/29 >>  Plan:  Clinimix-E 5/15 at 40ml/h74m fat emulsion x 7 days in critically ill, intubated pt as per protocol Decrease IVF to 35ml/hr31mn TPN hung Add 10 units insulin to TPN BMet, Mg, Phos in AM (as CMet done today) TPN labs qMon/Thurs F/U Nutritional goals per RD   Zakariye Nee HGracy Bruins Clinical Pharmacist [facility]

## 2015-07-20 NOTE — Progress Notes (Signed)
PULMONARY / CRITICAL CARE MEDICINE   Name: Monica Morrison MRN: [id number] DOB: May 23, 1945    ADMISSION DATE:  07/19/2015 CONSULTATION DATE:  07/19/15  REFERRING MD:  Dr. Brantley Stage   CHIEF COMPLAINT:  Malignant Small Bowel Obstruction  HISTORY OF PRESENT ILLNESS:  [name] with PMH of HTN, DM II, CKD, HTN, arthritis, hypothyroidism, breast cancer and recent admission from 5/19-5/24 for a small bowel obstruction that was found to be metastatic carcinoma s/p exploratory lap / small bowel resection who presented to [facility] on 5/28 with complaints of severe abdominal pain.    The patient was seen in the ER at [facility] for evaluation of abdominal pain.  Initial CT of the abdomen showed free air, a large quantity of free fluid and oral contrast.  Labs - Na 140, K 3.3, Hgb 13.3 / Hct 39, ionized calcium 1.0.  She was transferred to [facility] for operative evaluation. The patient was taken urgently to the OR for exploratory laparotomy with small bowel anastomosis resection.  She returned to the ICU post-operatively on mechanical ventilation.  Post-operatively, she was noted to have runs of SVT & a right mainstem intubation.  Repeat labs pending.    SUBJECTIVE: Patient in SVT this AM. No other acute events overnight.  REVIEW OF SYSTEMS:  Unable to complete as patient is on mechanical ventilation   VITAL SIGNS: BP 114/71 mmHg  Pulse 97  Temp(Src) 97.5 F (36.4 C) (Oral)  Resp 24  Ht 5\' 4"  (1.626 m)  Wt 192 lb 3.9 oz (87.2 kg)  BMI 32.98 kg/m2  SpO2 99%  HEMODYNAMICS: CVP:  [5 mmHg-7 mmHg] 6 mmHg  VENTILATOR SETTINGS: Vent Mode:  [-] PRVC FiO2 (%):  [40 %-100 %] 40 % Set Rate:  [16 bmp-20 bmp] 20 bmp Vt Set:  [430 mL] 430 mL PEEP:  [5 cmH20] 5 cmH20 Plateau Pressure:  [14 cmH20-19 cmH20] 15 cmH20  INTAKE / OUTPUT: I/O last 3 completed shifts: In: 5853.1 [I.V.:4783.1; NG/GT:120; IV Piggyback:950] Out: [id number] [Urine:275; Emesis/NG output:550; Drains:950; Blood:75]  PHYSICAL  EXAMINATION: General:  Sedated. No distress. Nurse at bedside. Neuro:  Sedated. Pupils equal. No withdrawal to pain in extremities. HEENT:  ETT in place. No scleral icterus or injection. Cardiovascular:  Tachycardic w/ irregular rhythm. No edema.  Lungs:  Clear to auscultation bilaterally. Symmetric chest rise on ventilator. Abdomen:  Soft. Wound Vac in place. Hypoactive BS. Integument:  Warm & dry. No rash on exposed skin.  LABS:  BMET  Recent Labs Lab 07/15/15 0501 07/19/15 0635 07/19/15 0753 07/19/15 1040 07/20/15 0501  NA 139 139 140 139 139  139  K 3.7 4.5 3.3* 2.9* 4.1  4.1  CL 109  --   --  112* 112*  112*  CO2 23  --   --  20* 19*  19*  BUN 12  --   --  17 21*  21*  CREATININE 0.93  --   --  1.59* 1.91*  1.88*  GLUCOSE 151* 176*  --  190* 224*  219*    Electrolytes  Recent Labs Lab 07/15/15 0501 07/19/15 1040 07/20/15 0501  CALCIUM 8.2* 6.6* 6.4*  6.5*  PHOS  --   --  4.3    CBC  Recent Labs Lab 07/15/15 0501  07/19/15 0753 07/19/15 1040 07/20/15 0501  WBC 6.9  --   --  5.7 8.0  HGB 9.1*  < > 10.2* 9.4* 8.7*  HCT 30.2*  < > 30.0* 31.0* 28.4*  PLT 292  --   --  276 246  < > = values in this interval not displayed.  Coag's No results for input(s): APTT, INR in the last 168 hours.  Sepsis Markers  Recent Labs Lab 07/19/15 1847  LATICACIDVEN 1.5    ABG  Recent Labs Lab 07/19/15 0753 07/19/15 1040 07/20/15 0555  PHART 7.152* 7.264* 7.333*  PCO2ART 47.8* 48.2* 36.6  PO2ART 373.0* 472.0* 156.0*    Liver Enzymes  Recent Labs Lab 07/20/15 0501  AST 218*  ALT 146*  ALKPHOS 133*  BILITOT 0.4  ALBUMIN 1.5*  1.5*    Cardiac Enzymes  Recent Labs Lab 07/19/15 1040 07/19/15 1536 07/19/15 2138  TROPONINI 0.06* 0.06* 0.06*    Glucose  Recent Labs Lab 07/16/15 0734 07/19/15 0844 07/19/15 1245 07/19/15 1549 07/19/15 2339 07/20/15 0351  GLUCAP 118* 192* 177* 173* 220* 196*    Imaging Dg Chest Port 1  View  07/20/2015  CLINICAL DATA:  Respiratory failure EXAM: PORTABLE CHEST 1 VIEW COMPARISON:  07/19/2015 FINDINGS: Cardiac shadow is stable. The endotracheal tube lies approximately 1 cm above the carina. A nasogastric catheter and right-sided jugular line are again seen and stable. The lungs are well aerated with some stable right basilar atelectasis. No new focal infiltrate is seen. Heavy calcification of the mitral annulus is noted. IMPRESSION: No significant change from the prior exam aside from the endotracheal tube being slightly withdrawn Electronically Signed   By: Inez Catalina M.D.   On: 07/20/2015 07:40   Dg Chest Port 1 View  07/19/2015  CLINICAL DATA:  Encounter for central line placement. EXAM: PORTABLE CHEST 1 VIEW COMPARISON:  Jul 10, 2015. FINDINGS: Stable cardiomediastinal silhouette. Interval placement of endotracheal tube which is directed into right mainstem bronchus; withdrawal by 2-3 cm is recommended. Interval placement of nasogastric tube which is seen entering stomach. Interval placement of right internal jugular catheter with distal tip in right atrium. Withdrawal by 2-3 cm is recommended as well. No pneumothorax or significant pleural effusion is noted. Mild right basilar subsegmental atelectasis is noted. Narrowing of the subacromial space bilaterally consistent with rotator cuff injury. IMPRESSION: Endotracheal tube is directed into right mainstem bronchus and withdrawal by 2-3 cm is recommended. Interval placement of right internal jugular catheter with distal tip in right atrium, and withdrawal by 2-3 cm is recommended. Critical Value/emergent results were called by telephone at the time of interpretation on 07/19/2015 at 9:17 am to Benjaman Pott, the patient's nurse, who verbally acknowledged these results and will contact the patient's physician. Electronically Signed   By: Marijo Conception, M.D.   On: 07/19/2015 09:18     STUDIES:  CT ABD/Pelvis 5/28 (OSH): bowel perforation  with large volume free air, free fluid, extraluminal enteric contrast in the pelvis, known intra-abdominal metastatic disease, bilateral adrenal masses, liver lesion on prior exam is not as well visualized, cholelithiasis with gallbladder distention.    PORT CXR 5/29:  Endotracheal tube in good position. R IJ CVL in right atrium. NGT coursing below stomach. EKG 5/29:  A fib w/ RVR.  MICROBIOLOGY: MRSA PCR 5/28:  Negative   ANTIBIOTICS: Zosyn 5/28 >>  Diflucan 5/28 (perf) >>   SIGNIFICANT EVENTS: 5/28  Re-Admit with abd pain, found to have perforation of anastomosis. Returned to ICU on vent post-op  LINES/TUBES: ETT 5/28 >>  R IJ CVC 5/28 >>  NGT R 5/28 >> FOLEY 5/28 >> L FEM ART LINE 5/28 >> PIV x1  ASSESSMENT / PLAN:  PULMONARY A: Acute Respiratory Failure  P:   PRVC  8 cc/kg  Wean PEEP / FiO2 for sats > 92% Increase RR to 20  Intermittent CXR   CARDIOVASCULAR A:  A fib w/ RVR - No h/o A fib. H/O HTN  P:  Lopressor 2.5mg  IV now May need to schedule IV Lopressor Telemetry monitoring Vitals per unit protocol Hold home lopressor, lipitor  Stat Magnesium level  RENAL A:   Acute Renal Failure - Secondary to hypovolemia.  Metabolic Acidosis - Stable. Hypokalemia - Resolved.  P:   Trending electrolytes & renal function daily Trending UOP with Foley  GASTROINTESTINAL A:   Metastatic Carcinoma with Small Bowel Obstruction  Transaminitis  P:   Post Op Mgt per CCS NPO Protonix IV daily Trending LFTs daily  HEMATOLOGIC/ONCOLOGIC A:   Anemia - No signs of active bleeding. Hx Breast Cancer   P:  Monitor CBC Transfuse per ICU guidelines Heparin [place] q8hr SCDs  INFECTIOUS A:   Peritonitis - s/p perforation of anastomosis in the setting of metastatic carcinoma   P:   Zosyn & Diflucan Day #2 Monitor fever curve / WBC   ENDOCRINE A:   Hypothyroidism  DM II  - BG uncontrolled.  P:   Synthroid 63mcg IV daily\ Accu-Checks q4hr Moderate dose SSI  per algorithm Hold home glipizide, actos Changing IVF to NS @ 75cc/hr  NEUROLOGIC A:   Acute Encephalopathy - post op small bowel resection  P:   RASS goal: -1 Propofol gtt for sedation  PRN fentanyl for pain    FAMILY  - Updates:  No family at bedside am 5/29.    - Inter-disciplinary family meet or Palliative Care meeting due by:  6/3  TODAY'S SUMMARY:  [name] with PMH of breast cancer with recent findings of metastatic carcinoma / small bowel obstruction with ex-lap and resection on 5/19.  Returns to [facility] on 5/28 with perforation of anastomosis with free air / fluid. To OR 5/28 for repeat ex-lap and resection of anastomosis. SVT this morning c/w A fib. Administering IV Lopressor. Switching off dextrose IVF. Continue close monitoring in ICU on ventilator.   I have spent a total of 31 minutes of critical care time caring for the patient and reviewing the patient's electronic medical record.  Sonia Baller Ashok Cordia, M.D. [facility] Pulmonary & Critical Care Pager:  [phone] After 3pm or if no response, call [phone] 7:45 AM 07/20/2015

## 2015-07-20 NOTE — Progress Notes (Signed)
Patient ID: Monica Morrison, female   DOB: 24-Jun-1945, 70 y.o.   MRN: [id number]     [facility]      [address] [place], [place] [address]    Phone: [phone] FAX: [phone]     Subjective: Went into af with rvr this AM. sCr bit worse, low uop.  K 4.1.  LFTs up with a normal TB.   Objective:  Vital signs:  Filed Vitals:   07/20/15 0400 07/20/15 0500 07/20/15 0600 07/20/15 0700  BP: 105/61 96/58 98/56  114/71  Pulse: 101 109 97   Temp: 97.5 F (36.4 C)     TempSrc: Oral     Resp: 22 23 22 24   Height:      Weight: 87.2 kg (192 lb 3.9 oz)     SpO2: 98% 100% 99%     Last BM Date:  (unknown)  Intake/Output   Yesterday:  05/28 0701 - 05/29 0700 In: 5853.1 [I.V.:4783.1; NG/GT:120; IV Piggyback:950] Out: 1850 [Urine:275; Emesis/NG output:550; Drains:950; Blood:75] This shift:     Physical Exam: General: Pt sedated on vent.  Chest: cta.  No chest wall pain w good excursion CV:  s1s2 irregularly irregular.  Abdomen: Soft.  .vac in place with serosanguinous drainage, surrounding skin is intact.  Ext:  SCDs BLE.  +2 BLE edema.     Problem List:   Active Problems:   Perforated bowel ([place])   Peritonitis ([place])    Results:   Labs: Results for orders placed or performed during the hospital encounter of 07/19/15 (from the past 48 hour(s))  Type and screen [place]     Status: None   Collection Time: 07/19/15  6:05 AM  Result Value Ref Range   ABO/RH(D) O POS    Antibody Screen NEG    Sample Expiration 07/22/2015   I-STAT 4, (NA,K, GLUC, HGB,HCT)     Status: Abnormal   Collection Time: 07/19/15  6:35 AM  Result Value Ref Range   Sodium 139 135 - 145 mmol/L   Potassium 4.5 3.5 - 5.1 mmol/L   Glucose, Bld 176 (H) 65 - 99 mg/dL   HCT 39.0 36.0 - 46.0 %   Hemoglobin 13.3 12.0 - 15.0 g/dL  I-STAT 7, (LYTES, BLD GAS, ICA, H+H)     Status: Abnormal   Collection Time: 07/19/15  7:53 AM  Result  Value Ref Range   pH, Arterial 7.152 (LL) 7.350 - 7.450   pCO2 arterial 47.8 (H) 35.0 - 45.0 mmHg   pO2, Arterial 373.0 (H) 80.0 - 100.0 mmHg   Bicarbonate 16.7 (L) 20.0 - 24.0 mEq/L   TCO2 18 0 - 100 mmol/L   O2 Saturation 100.0 %   Acid-base deficit 12.0 (H) 0.0 - 2.0 mmol/L   Sodium 140 135 - 145 mmol/L   Potassium 3.3 (L) 3.5 - 5.1 mmol/L   Calcium, Ion 1.00 (L) 1.13 - 1.30 mmol/L   HCT 30.0 (L) 36.0 - 46.0 %   Hemoglobin 10.2 (L) 12.0 - 15.0 g/dL   Patient temperature HIDE    Sample type ARTERIAL    Comment NOTIFIED PHYSICIAN   MRSA PCR Screening     Status: None   Collection Time: 07/19/15  8:25 AM  Result Value Ref Range   MRSA by PCR NEGATIVE NEGATIVE    Comment:        The GeneXpert MRSA Assay (FDA approved for NASAL specimens only), is one component of a comprehensive MRSA colonization surveillance  program. It is not intended to diagnose MRSA infection nor to guide or monitor treatment for MRSA infections.   Glucose, capillary     Status: Abnormal   Collection Time: 07/19/15  8:44 AM  Result Value Ref Range   Glucose-Capillary 192 (H) 65 - 99 mg/dL   Comment 1 Capillary Specimen    Comment 2 Notify RN   CBC     Status: Abnormal   Collection Time: 07/19/15 10:40 AM  Result Value Ref Range   WBC 5.7 4.0 - 10.5 K/uL   RBC 3.81 (L) 3.87 - 5.11 MIL/uL   Hemoglobin 9.4 (L) 12.0 - 15.0 g/dL   HCT 31.0 (L) 36.0 - 46.0 %   MCV 81.4 78.0 - 100.0 fL   MCH 24.7 (L) 26.0 - 34.0 pg   MCHC 30.3 30.0 - 36.0 g/dL   RDW 17.7 (H) 11.5 - 15.5 %   Platelets 276 150 - 400 K/uL  Prealbumin     Status: Abnormal   Collection Time: 07/19/15 10:40 AM  Result Value Ref Range   Prealbumin 2.5 (L) 18 - 38 mg/dL  Troponin I     Status: Abnormal   Collection Time: 07/19/15 10:40 AM  Result Value Ref Range   Troponin I 0.06 (H) <0.031 ng/mL    Comment:        PERSISTENTLY INCREASED TROPONIN VALUES IN THE RANGE OF 0.04-0.49 ng/mL CAN BE SEEN IN:       -UNSTABLE ANGINA        -CONGESTIVE HEART FAILURE       -MYOCARDITIS       -CHEST TRAUMA       -ARRYHTHMIAS       -LATE PRESENTING MYOCARDIAL INFARCTION       -COPD   CLINICAL FOLLOW-UP RECOMMENDED.   Basic metabolic panel     Status: Abnormal   Collection Time: 07/19/15 10:40 AM  Result Value Ref Range   Sodium 139 135 - 145 mmol/L   Potassium 2.9 (L) 3.5 - 5.1 mmol/L   Chloride 112 (H) 101 - 111 mmol/L   CO2 20 (L) 22 - 32 mmol/L   Glucose, Bld 190 (H) 65 - 99 mg/dL   BUN 17 6 - 20 mg/dL   Creatinine, Ser 1.59 (H) 0.44 - 1.00 mg/dL   Calcium 6.6 (L) 8.9 - 10.3 mg/dL   GFR calc non Af Amer 32 (L) >60 mL/min   GFR calc Af Amer 37 (L) >60 mL/min    Comment: (NOTE) The eGFR has been calculated using the CKD EPI equation. This calculation has not been validated in all clinical situations. eGFR's persistently <60 mL/min signify possible Chronic Kidney Disease.    Anion gap 7 5 - 15  I-STAT 3, arterial blood gas (G3+)     Status: Abnormal   Collection Time: 07/19/15 10:40 AM  Result Value Ref Range   pH, Arterial 7.264 (L) 7.350 - 7.450   pCO2 arterial 48.2 (H) 35.0 - 45.0 mmHg   pO2, Arterial 472.0 (H) 80.0 - 100.0 mmHg   Bicarbonate 22.0 20.0 - 24.0 mEq/L   TCO2 24 0 - 100 mmol/L   O2 Saturation 100.0 %   Acid-base deficit 5.0 (H) 0.0 - 2.0 mmol/L   Patient temperature 97.2 F    Collection site ARTERIAL LINE    Drawn by Nurse    Sample type ARTERIAL   Triglycerides     Status: None   Collection Time: 07/19/15 10:45 AM  Result Value Ref Range   Triglycerides  60 <150 mg/dL  TSH     Status: Abnormal   Collection Time: 07/19/15 12:07 PM  Result Value Ref Range   TSH 5.004 (H) 0.350 - 4.500 uIU/mL  Glucose, capillary     Status: Abnormal   Collection Time: 07/19/15 12:45 PM  Result Value Ref Range   Glucose-Capillary 177 (H) 65 - 99 mg/dL   Comment 1 Capillary Specimen    Comment 2 Notify RN   Troponin I     Status: Abnormal   Collection Time: 07/19/15  3:36 PM  Result Value Ref Range    Troponin I 0.06 (H) <0.031 ng/mL    Comment:        PERSISTENTLY INCREASED TROPONIN VALUES IN THE RANGE OF 0.04-0.49 ng/mL CAN BE SEEN IN:       -UNSTABLE ANGINA       -CONGESTIVE HEART FAILURE       -MYOCARDITIS       -CHEST TRAUMA       -ARRYHTHMIAS       -LATE PRESENTING MYOCARDIAL INFARCTION       -COPD   CLINICAL FOLLOW-UP RECOMMENDED.   Glucose, capillary     Status: Abnormal   Collection Time: 07/19/15  3:49 PM  Result Value Ref Range   Glucose-Capillary 173 (H) 65 - 99 mg/dL   Comment 1 Capillary Specimen    Comment 2 Notify RN   Lactic acid, plasma     Status: None   Collection Time: 07/19/15  6:47 PM  Result Value Ref Range   Lactic Acid, Venous 1.5 0.5 - 2.0 mmol/L  Troponin I     Status: Abnormal   Collection Time: 07/19/15  9:38 PM  Result Value Ref Range   Troponin I 0.06 (H) <0.031 ng/mL    Comment:        PERSISTENTLY INCREASED TROPONIN VALUES IN THE RANGE OF 0.04-0.49 ng/mL CAN BE SEEN IN:       -UNSTABLE ANGINA       -CONGESTIVE HEART FAILURE       -MYOCARDITIS       -CHEST TRAUMA       -ARRYHTHMIAS       -LATE PRESENTING MYOCARDIAL INFARCTION       -COPD   CLINICAL FOLLOW-UP RECOMMENDED.   Glucose, capillary     Status: Abnormal   Collection Time: 07/19/15 11:39 PM  Result Value Ref Range   Glucose-Capillary 220 (H) 65 - 99 mg/dL   Comment 1 Notify RN    Comment 2 Document in Chart   Glucose, capillary     Status: Abnormal   Collection Time: 07/20/15  3:51 AM  Result Value Ref Range   Glucose-Capillary 196 (H) 65 - 99 mg/dL   Comment 1 Notify RN    Comment 2 Document in Chart   CBC     Status: Abnormal   Collection Time: 07/20/15  5:01 AM  Result Value Ref Range   WBC 8.0 4.0 - 10.5 K/uL   RBC 3.48 (L) 3.87 - 5.11 MIL/uL   Hemoglobin 8.7 (L) 12.0 - 15.0 g/dL   HCT 28.4 (L) 36.0 - 46.0 %   MCV 81.6 78.0 - 100.0 fL   MCH 25.0 (L) 26.0 - 34.0 pg   MCHC 30.6 30.0 - 36.0 g/dL   RDW 18.1 (H) 11.5 - 15.5 %   Platelets 246 150 - 400 K/uL   Comprehensive metabolic panel     Status: Abnormal   Collection Time: 07/20/15  5:01 AM  Result Value  Ref Range   Sodium 139 135 - 145 mmol/L   Potassium 4.1 3.5 - 5.1 mmol/L   Chloride 112 (H) 101 - 111 mmol/L   CO2 19 (L) 22 - 32 mmol/L   Glucose, Bld 224 (H) 65 - 99 mg/dL   BUN 21 (H) 6 - 20 mg/dL   Creatinine, Ser 1.91 (H) 0.44 - 1.00 mg/dL   Calcium 6.4 (LL) 8.9 - 10.3 mg/dL    Comment: CRITICAL RESULT CALLED TO, READ BACK BY AND VERIFIED WITH: PERRIN J,RN 07/20/15 0604 WAYK    Total Protein 4.1 (L) 6.5 - 8.1 g/dL   Albumin 1.5 (L) 3.5 - 5.0 g/dL   AST 218 (H) 15 - 41 U/L   ALT 146 (H) 14 - 54 U/L   Alkaline Phosphatase 133 (H) 38 - 126 U/L   Total Bilirubin 0.4 0.3 - 1.2 mg/dL   GFR calc non Af Amer 25 (L) >60 mL/min   GFR calc Af Amer 30 (L) >60 mL/min    Comment: (NOTE) The eGFR has been calculated using the CKD EPI equation. This calculation has not been validated in all clinical situations. eGFR's persistently <60 mL/min signify possible Chronic Kidney Disease.    Anion gap 8 5 - 15  Renal function panel     Status: Abnormal   Collection Time: 07/20/15  5:01 AM  Result Value Ref Range   Sodium 139 135 - 145 mmol/L   Potassium 4.1 3.5 - 5.1 mmol/L    Comment: DELTA CHECK NOTED   Chloride 112 (H) 101 - 111 mmol/L   CO2 19 (L) 22 - 32 mmol/L   Glucose, Bld 219 (H) 65 - 99 mg/dL   BUN 21 (H) 6 - 20 mg/dL   Creatinine, Ser 1.88 (H) 0.44 - 1.00 mg/dL   Calcium 6.5 (L) 8.9 - 10.3 mg/dL   Phosphorus 4.3 2.5 - 4.6 mg/dL   Albumin 1.5 (L) 3.5 - 5.0 g/dL   GFR calc non Af Amer 26 (L) >60 mL/min   GFR calc Af Amer 30 (L) >60 mL/min    Comment: (NOTE) The eGFR has been calculated using the CKD EPI equation. This calculation has not been validated in all clinical situations. eGFR's persistently <60 mL/min signify possible Chronic Kidney Disease.    Anion gap 8 5 - 15  I-STAT 3, arterial blood gas (G3+)     Status: Abnormal   Collection Time: 07/20/15  5:55 AM   Result Value Ref Range   pH, Arterial 7.333 (L) 7.350 - 7.450   pCO2 arterial 36.6 35.0 - 45.0 mmHg   pO2, Arterial 156.0 (H) 80.0 - 100.0 mmHg   Bicarbonate 19.4 (L) 20.0 - 24.0 mEq/L   TCO2 21 0 - 100 mmol/L   O2 Saturation 99.0 %   Acid-base deficit 6.0 (H) 0.0 - 2.0 mmol/L   Patient temperature 98.6 F    Collection site ARTERIAL LINE    Drawn by RT    Sample type ARTERIAL     Imaging / Studies: Dg Chest Port 1 View  07/20/2015  CLINICAL DATA:  Respiratory failure EXAM: PORTABLE CHEST 1 VIEW COMPARISON:  07/19/2015 FINDINGS: Cardiac shadow is stable. The endotracheal tube lies approximately 1 cm above the carina. A nasogastric catheter and right-sided jugular line are again seen and stable. The lungs are well aerated with some stable right basilar atelectasis. No new focal infiltrate is seen. Heavy calcification of the mitral annulus is noted. IMPRESSION: No significant change from the prior exam aside from  the endotracheal tube being slightly withdrawn Electronically Signed   By: Inez Catalina M.D.   On: 07/20/2015 07:40   Dg Chest Port 1 View  07/19/2015  CLINICAL DATA:  Encounter for central line placement. EXAM: PORTABLE CHEST 1 VIEW COMPARISON:  Jul 10, 2015. FINDINGS: Stable cardiomediastinal silhouette. Interval placement of endotracheal tube which is directed into right mainstem bronchus; withdrawal by 2-3 cm is recommended. Interval placement of nasogastric tube which is seen entering stomach. Interval placement of right internal jugular catheter with distal tip in right atrium. Withdrawal by 2-3 cm is recommended as well. No pneumothorax or significant pleural effusion is noted. Mild right basilar subsegmental atelectasis is noted. Narrowing of the subacromial space bilaterally consistent with rotator cuff injury. IMPRESSION: Endotracheal tube is directed into right mainstem bronchus and withdrawal by 2-3 cm is recommended. Interval placement of right internal jugular catheter with  distal tip in right atrium, and withdrawal by 2-3 cm is recommended. Critical Value/emergent results were called by telephone at the time of interpretation on 07/19/2015 at 9:17 am to Benjaman Pott, the patient's nurse, who verbally acknowledged these results and will contact the patient's physician. Electronically Signed   By: Marijo Conception, M.D.   On: 07/19/2015 09:18    Medications / Allergies:  Scheduled Meds: . antiseptic oral rinse  7 mL Mouth Rinse QID  . chlorhexidine gluconate (SAGE KIT)  15 mL Mouth Rinse BID  . heparin subcutaneous  5,000 Units Subcutaneous Q8H  . insulin aspart  0-15 Units Subcutaneous Q4H  . levothyroxine  56 mcg Intravenous Daily  . metoprolol  2.5 mg Intravenous Once  . pantoprazole (PROTONIX) IV  40 mg Intravenous Q24H  . piperacillin-tazobactam (ZOSYN)  IV  3.375 g Intravenous Q8H   Continuous Infusions: . dextrose 5 % and 0.9 % NaCl with KCl 20 mEq/L 125 mL/hr at 07/20/15 0700  . propofol (DIPRIVAN) infusion 15 mcg/kg/min (07/20/15 0700)   PRN Meds:.fentaNYL (SUBLIMAZE) injection, fentaNYL (SUBLIMAZE) injection, ondansetron **OR** ondansetron (ZOFRAN) IV  Antibiotics: Anti-infectives    Start     Dose/Rate Route Frequency Ordered Stop   07/19/15 1000  piperacillin-tazobactam (ZOSYN) IVPB 3.375 g     3.375 g 12.5 mL/hr over 240 Minutes Intravenous Every 8 hours 07/19/15 0828     07/19/15 0608  dextrose 5 % with cefOXitin (MEFOXIN) ADS Med    Comments:  [name]   : cabinet override      07/19/15 2355 07/19/15 0629       Assessment/Plan SBO due to tumor, metastatic cancer, primary likely from breast.  exploratory laparotomy, SBR---Dr. Grandville Silos 07/10/15  Breakdown of previous small bowel anastomosis and peritonitis  POD#1Exploratory laparotomy with resection of anastomosis and placement of wound vac--Dr. Brantley Stage 07/20/15 -Back to OR tomorrow -NGT, VAC, vent  ID-Zosyn D#1 peritonitis  PCM-prealbumin 2.5, insert PICC, start TPN Acute  respiratory failure-appreciate CCM management.  OR tomorrow Hypotension-after lopressor, add neo, give 500cc fluid bolus Acute renal failure-monitor UOP Metastatic breast cancer/carcinomatosis AF with RVR-new this AM, appreciate CCM management VTE prophylaxis-SCD/heparin, hydrate  Dispo-ICU  No family present.  Spoke with son, ED, 5/28.    Erby Pian, ANP-BC [facility]    07/20/2015 8:05 AM

## 2015-07-20 NOTE — Progress Notes (Signed)
Peripherally Inserted Central Catheter/Midline Placement  The IV Nurse has discussed with the patient and/or persons authorized to consent for the patient, the purpose of this procedure and the potential benefits and risks involved with this procedure.  The benefits include less needle sticks, lab draws from the catheter and patient may be discharged home with the catheter.  Risks include, but not limited to, infection, bleeding, blood clot (thrombus formation), and puncture of an artery; nerve damage and irregular heat beat.  Alternatives to this procedure were also discussed.  PICC/Midline Placement Documentation        [name] 07/20/2015, 3:27 PM  Telephone consent

## 2015-07-20 NOTE — Progress Notes (Signed)
Central line pulled back 3cm.  Sutured in place at 13 cm.  Biopatch applied.     Plan: Follow up central line placement on am CXR Monitor atrial arrhythmias   Noe Gens, NP-C  Pulmonary & Critical Care Pgr: [phone] or if no answer [phone] 07/20/2015, 10:06 AM

## 2015-07-21 ENCOUNTER — Ambulatory Visit: Payer: Commercial Managed Care - HMO | Admitting: Hematology and Oncology

## 2015-07-21 ENCOUNTER — Inpatient Hospital Stay (HOSPITAL_COMMUNITY): Payer: Commercial Managed Care - HMO | Admitting: Anesthesiology

## 2015-07-21 ENCOUNTER — Encounter (HOSPITAL_COMMUNITY): Admission: EM | Disposition: A | Discharge status: Hospice | Payer: Self-pay | Source: Home / Self Care

## 2015-07-21 ENCOUNTER — Inpatient Hospital Stay (HOSPITAL_COMMUNITY): Payer: Commercial Managed Care - HMO

## 2015-07-21 HISTORY — PX: LAPAROTOMY: SHX154

## 2015-07-21 LAB — GLUCOSE, CAPILLARY
GLUCOSE-CAPILLARY: 188 mg/dL — AB (ref 65–99)
GLUCOSE-CAPILLARY: 198 mg/dL — AB (ref 65–99)
GLUCOSE-CAPILLARY: 181 mg/dL — AB (ref 65–99)
Glucose-Capillary: 156 mg/dL — ABNORMAL HIGH (ref 65–99)
Glucose-Capillary: 243 mg/dL — ABNORMAL HIGH (ref 65–99)

## 2015-07-21 LAB — CBC
HCT: 27.1 % — ABNORMAL LOW (ref 36.0–46.0)
Hemoglobin: 8.3 g/dL — ABNORMAL LOW (ref 12.0–15.0)
MCH: 24.9 pg — AB (ref 26.0–34.0)
MCHC: 30.6 g/dL (ref 30.0–36.0)
MCV: 81.1 fL (ref 78.0–100.0)
PLATELETS: 246 10*3/uL (ref 150–400)
RBC: 3.34 MIL/uL — ABNORMAL LOW (ref 3.87–5.11)
RDW: 18.3 % — AB (ref 11.5–15.5)
WBC: 13.6 10*3/uL — AB (ref 4.0–10.5)

## 2015-07-21 LAB — COMPREHENSIVE METABOLIC PANEL
ALT: 106 U/L — ABNORMAL HIGH (ref 14–54)
ANION GAP: 8 (ref 5–15)
AST: 104 U/L — ABNORMAL HIGH (ref 15–41)
Albumin: 1.2 g/dL — ABNORMAL LOW (ref 3.5–5.0)
Alkaline Phosphatase: 148 U/L — ABNORMAL HIGH (ref 38–126)
BILIRUBIN TOTAL: 0.3 mg/dL (ref 0.3–1.2)
BUN: 24 mg/dL — ABNORMAL HIGH (ref 6–20)
CHLORIDE: 113 mmol/L — AB (ref 101–111)
CO2: 18 mmol/L — ABNORMAL LOW (ref 22–32)
Calcium: 6.4 mg/dL — CL (ref 8.9–10.3)
Creatinine, Ser: 1.84 mg/dL — ABNORMAL HIGH (ref 0.44–1.00)
GFR calc Af Amer: 31 mL/min — ABNORMAL LOW (ref >60)
GFR, EST NON AFRICAN AMERICAN: 27 mL/min — AB (ref >60)
Glucose, Bld: 195 mg/dL — ABNORMAL HIGH (ref 65–99)
POTASSIUM: 3.6 mmol/L (ref 3.5–5.1)
Sodium: 139 mmol/L (ref 135–145)
TOTAL PROTEIN: 4.2 g/dL — AB (ref 6.5–8.1)

## 2015-07-21 LAB — MAGNESIUM: MAGNESIUM: 2.1 mg/dL (ref 1.7–2.4)

## 2015-07-21 LAB — PHOSPHORUS: PHOSPHORUS: 3.7 mg/dL (ref 2.5–4.6)

## 2015-07-21 LAB — TRIGLYCERIDES: TRIGLYCERIDES: 125 mg/dL (ref <150)

## 2015-07-21 SURGERY — LAPAROTOMY, EXPLORATORY
Anesthesia: General | Site: Abdomen

## 2015-07-21 MED ORDER — FENTANYL CITRATE (PF) 250 MCG/5ML IJ SOLN
INTRAMUSCULAR | Status: AC
  Filled 2015-07-21: qty 5

## 2015-07-21 MED ORDER — AMIODARONE HCL IN DEXTROSE 360-4.14 MG/200ML-% IV SOLN
30.0000 mg/h | INTRAVENOUS | Status: DC
  Administered 2015-07-21 – 2015-07-29 (×18): 30 mg/h via INTRAVENOUS
  Filled 2015-07-21 (×19): qty 200

## 2015-07-21 MED ORDER — LIDOCAINE 2% (20 MG/ML) 5 ML SYRINGE
INTRAMUSCULAR | Status: AC
  Filled 2015-07-21: qty 5

## 2015-07-21 MED ORDER — PHENYLEPHRINE HCL 10 MG/ML IJ SOLN
INTRAMUSCULAR | Status: DC | PRN
  Administered 2015-07-21: 80 ug via INTRAVENOUS

## 2015-07-21 MED ORDER — AMIODARONE HCL IN DEXTROSE 360-4.14 MG/200ML-% IV SOLN
60.0000 mg/h | INTRAVENOUS | Status: AC
  Administered 2015-07-21: 60 mg/h via INTRAVENOUS

## 2015-07-21 MED ORDER — EVICEL 5 ML EX KIT
PACK | CUTANEOUS | Status: DC | PRN
  Administered 2015-07-21: 1 via TOPICAL

## 2015-07-21 MED ORDER — SODIUM CHLORIDE 0.9 % IV SOLN
1.0000 g | Freq: Once | INTRAVENOUS | Status: AC
  Administered 2015-07-21: 1 g via INTRAVENOUS
  Filled 2015-07-21: qty 10

## 2015-07-21 MED ORDER — SODIUM CHLORIDE 0.9 % IV BOLUS (SEPSIS)
500.0000 mL | Freq: Once | INTRAVENOUS | Status: AC
  Administered 2015-07-21: 500 mL via INTRAVENOUS

## 2015-07-21 MED ORDER — LACTATED RINGERS IV SOLN
INTRAVENOUS | Status: DC | PRN
  Administered 2015-07-21: 09:00:00 via INTRAVENOUS

## 2015-07-21 MED ORDER — SODIUM CHLORIDE 0.9 % IV SOLN
INTRAVENOUS | Status: DC
  Administered 2015-07-21 – 2015-07-28 (×3): via INTRAVENOUS

## 2015-07-21 MED ORDER — AMIODARONE HCL IN DEXTROSE 360-4.14 MG/200ML-% IV SOLN
INTRAVENOUS | Status: AC
  Administered 2015-07-21: 60 mg/h via INTRAVENOUS
  Filled 2015-07-21: qty 200

## 2015-07-21 MED ORDER — ROCURONIUM BROMIDE 100 MG/10ML IV SOLN
INTRAVENOUS | Status: DC | PRN
  Administered 2015-07-21 (×2): 50 mg via INTRAVENOUS

## 2015-07-21 MED ORDER — ROCURONIUM BROMIDE 50 MG/5ML IV SOLN
INTRAVENOUS | Status: AC
  Filled 2015-07-21: qty 2

## 2015-07-21 MED ORDER — PROPOFOL 10 MG/ML IV BOLUS
INTRAVENOUS | Status: AC
  Filled 2015-07-21: qty 20

## 2015-07-21 MED ORDER — ROCURONIUM BROMIDE 50 MG/5ML IV SOLN
INTRAVENOUS | Status: AC
  Filled 2015-07-21: qty 1

## 2015-07-21 MED ORDER — POTASSIUM CHLORIDE 10 MEQ/50ML IV SOLN
10.0000 meq | INTRAVENOUS | Status: AC
  Administered 2015-07-21: 10 meq via INTRAVENOUS
  Filled 2015-07-21: qty 50

## 2015-07-21 MED ORDER — PHENYLEPHRINE 40 MCG/ML (10ML) SYRINGE FOR IV PUSH (FOR BLOOD PRESSURE SUPPORT)
PREFILLED_SYRINGE | INTRAVENOUS | Status: AC
  Filled 2015-07-21: qty 10

## 2015-07-21 MED ORDER — EVICEL 5 ML EX KIT
PACK | CUTANEOUS | Status: AC
  Filled 2015-07-21: qty 1

## 2015-07-21 MED ORDER — FENTANYL CITRATE (PF) 100 MCG/2ML IJ SOLN
INTRAMUSCULAR | Status: DC | PRN
  Administered 2015-07-21: 100 ug via INTRAVENOUS
  Administered 2015-07-21 (×3): 50 ug via INTRAVENOUS

## 2015-07-21 MED ORDER — PROPOFOL 500 MG/50ML IV EMUL
INTRAVENOUS | Status: DC | PRN
  Administered 2015-07-21: 20 ug/kg/min via INTRAVENOUS

## 2015-07-21 MED ORDER — DEXTROSE 5 % IV SOLN
10.0000 mg | INTRAVENOUS | Status: DC | PRN
  Administered 2015-07-21: 50 ug/min via INTRAVENOUS

## 2015-07-21 MED ORDER — AMIODARONE HCL IN DEXTROSE 360-4.14 MG/200ML-% IV SOLN
60.0000 mg/h | INTRAVENOUS | Status: AC
  Administered 2015-07-21: 60 mg/h via INTRAVENOUS
  Filled 2015-07-21: qty 200

## 2015-07-21 MED ORDER — PROPOFOL 10 MG/ML IV BOLUS
INTRAVENOUS | Status: DC | PRN
  Administered 2015-07-21: 30 mg via INTRAVENOUS

## 2015-07-21 MED ORDER — ONDANSETRON HCL 4 MG/2ML IJ SOLN
INTRAMUSCULAR | Status: AC
Start: 2015-07-21 — End: 2015-07-21
  Filled 2015-07-21: qty 2

## 2015-07-21 MED ORDER — 0.9 % SODIUM CHLORIDE (POUR BTL) OPTIME
TOPICAL | Status: DC | PRN
  Administered 2015-07-21 (×6): 1000 mL

## 2015-07-21 MED ORDER — TRACE MINERALS CR-CU-MN-SE-ZN 10-1000-500-60 MCG/ML IV SOLN
INTRAVENOUS | Status: AC
  Administered 2015-07-21: 17:00:00 via INTRAVENOUS
  Filled 2015-07-21: qty 1560

## 2015-07-21 SURGICAL SUPPLY — 50 items
BNDG GAUZE ELAST 4 BULKY (GAUZE/BANDAGES/DRESSINGS) ×2 IMPLANT
BRR ADH 5X3 SEPRAFILM 6 SHT (MISCELLANEOUS) ×1
CANISTER SUCTION 2500CC (MISCELLANEOUS) ×3 IMPLANT
CHLORAPREP W/TINT 26ML (MISCELLANEOUS) ×3 IMPLANT
COVER SURGICAL LIGHT HANDLE (MISCELLANEOUS) ×3 IMPLANT
DRAPE LAPAROSCOPIC ABDOMINAL (DRAPES) ×3 IMPLANT
DRAPE WARM FLUID 44X44 (DRAPE) ×3 IMPLANT
DRSG OPSITE POSTOP 4X10 (GAUZE/BANDAGES/DRESSINGS) IMPLANT
DRSG OPSITE POSTOP 4X8 (GAUZE/BANDAGES/DRESSINGS) IMPLANT
DRSG PAD ABDOMINAL 8X10 ST (GAUZE/BANDAGES/DRESSINGS) ×4 IMPLANT
ELECT CAUTERY BLADE 6.4 (BLADE) ×3 IMPLANT
ELECT REM PT RETURN 9FT ADLT (ELECTROSURGICAL) ×3
ELECTRODE REM PT RTRN 9FT ADLT (ELECTROSURGICAL) ×1 IMPLANT
GAUZE SPONGE 4X4 12PLY STRL (GAUZE/BANDAGES/DRESSINGS) ×2 IMPLANT
GLOVE BIO SURGEON STRL SZ8 (GLOVE) ×2 IMPLANT
GLOVE BIOGEL M STRL SZ7.5 (GLOVE) ×5 IMPLANT
GLOVE BIOGEL PI IND STRL 7.0 (GLOVE) IMPLANT
GLOVE BIOGEL PI IND STRL 8 (GLOVE) ×1 IMPLANT
GLOVE BIOGEL PI INDICATOR 7.0 (GLOVE) ×2
GLOVE BIOGEL PI INDICATOR 8 (GLOVE) ×2
GLOVE ECLIPSE 7.0 STRL STRAW (GLOVE) ×2 IMPLANT
GLOVE SURG SS PI 6.5 STRL IVOR (GLOVE) ×4 IMPLANT
GLOVE SURG SS PI 7.0 STRL IVOR (GLOVE) ×2 IMPLANT
GOWN STRL REUS W/ TWL LRG LVL3 (GOWN DISPOSABLE) ×1 IMPLANT
GOWN STRL REUS W/ TWL XL LVL3 (GOWN DISPOSABLE) ×1 IMPLANT
GOWN STRL REUS W/TWL 2XL LVL3 (GOWN DISPOSABLE) ×2 IMPLANT
GOWN STRL REUS W/TWL LRG LVL3 (GOWN DISPOSABLE) ×3
GOWN STRL REUS W/TWL XL LVL3 (GOWN DISPOSABLE) ×6
KIT BASIN OR (CUSTOM PROCEDURE TRAY) ×3 IMPLANT
KIT ROOM TURNOVER OR (KITS) ×3 IMPLANT
NS IRRIG 1000ML POUR BTL (IV SOLUTION) ×6 IMPLANT
PACK GENERAL/GYN (CUSTOM PROCEDURE TRAY) ×3 IMPLANT
PAD ARMBOARD 7.5X6 YLW CONV (MISCELLANEOUS) ×3 IMPLANT
PREP POVIDONE IODINE SPRAY 2OZ (MISCELLANEOUS) ×2 IMPLANT
RELOAD PROXIMATE 75MM BLUE (ENDOMECHANICALS) ×3 IMPLANT
RELOAD STAPLE 75 3.8 BLU REG (ENDOMECHANICALS) IMPLANT
SEPRAFILM PROCEDURAL PACK 3X5 (MISCELLANEOUS) ×2 IMPLANT
SOL PREP POV-IOD 4OZ 10% (MISCELLANEOUS) ×2 IMPLANT
SPONGE LAP 18X18 X RAY DECT (DISPOSABLE) ×2 IMPLANT
STAPLER PROXIMATE 75MM BLUE (STAPLE) ×2 IMPLANT
STAPLER VISISTAT 35W (STAPLE) ×3 IMPLANT
SUCTION POOLE TIP (SUCTIONS) ×3 IMPLANT
SUT NOVA 1 T20/GS 25DT (SUTURE) ×2 IMPLANT
SUT PDS AB 1 TP1 96 (SUTURE) ×8 IMPLANT
SUT SILK 2 0 SH CR/8 (SUTURE) ×3 IMPLANT
SUT SILK 2 0 TIES 10X30 (SUTURE) ×3 IMPLANT
SUT SILK 3 0 SH CR/8 (SUTURE) ×5 IMPLANT
SUT SILK 3 0 TIES 10X30 (SUTURE) ×3 IMPLANT
TOWEL OR 17X26 10 PK STRL BLUE (TOWEL DISPOSABLE) ×3 IMPLANT
YANKAUER SUCT BULB TIP NO VENT (SUCTIONS) ×2 IMPLANT

## 2015-07-21 NOTE — Progress Notes (Signed)
[place] Progress Note Patient Name: Monica Morrison DOB: 02/27/1945 MRN: [id number]   Date of Service  07/21/2015  HPI/Events of Note  Ca++ = 6.4 and albumin = 1.2. Ca++ corrects to 8.64 (almost normal).  eICU Interventions  Will replete Ca++.     Intervention Category Intermediate Interventions: Electrolyte abnormality - evaluation and management  Sommer,Steven Eugene 07/21/2015, 5:47 AM

## 2015-07-21 NOTE — Progress Notes (Signed)
PULMONARY / CRITICAL CARE MEDICINE   Name: Monica Morrison MRN: [id number] DOB: 1945/04/06    ADMISSION DATE:  07/19/2015 CONSULTATION DATE:  07/19/15  REFERRING MD:  Dr. Brantley Stage   CHIEF COMPLAINT:  Malignant Small Bowel Obstruction  HISTORY OF PRESENT ILLNESS:  [name] with PMH of HTN, DM II, CKD, HTN, arthritis, hypothyroidism, breast cancer and recent admission from 5/19-5/24 for a small bowel obstruction that was found to be metastatic carcinoma s/p exploratory lap / small bowel resection who presented to [facility] on 5/28 with complaints of severe abdominal pain.    The patient was seen in the ER at [facility] for evaluation of abdominal pain.  Initial CT of the abdomen showed free air, a large quantity of free fluid and oral contrast.  Labs - Na 140, K 3.3, Hgb 13.3 / Hct 39, ionized calcium 1.0.  She was transferred to [facility] for operative evaluation. The patient was taken urgently to the OR for exploratory laparotomy with small bowel anastomosis resection.  She returned to the ICU post-operatively on mechanical ventilation.  Post-operatively, she was noted to have runs of SVT & a right mainstem intubation.  Repeat labs pending.    SUBJECTIVE: Patient with atrial fibrillation in this morning. No other acute events overnight.  REVIEW OF SYSTEMS:  Unable to obtain given intubation and sedation.   VITAL SIGNS: BP 120/67 mmHg  Pulse 90  Temp(Src) 98.7 F (37.1 C) (Oral)  Resp 20  Ht 5\' 4"  (1.626 m)  Wt 192 lb 3.9 oz (87.2 kg)  BMI 32.98 kg/m2  SpO2 100%  HEMODYNAMICS: CVP:  [5 mmHg-11 mmHg] 9 mmHg  VENTILATOR SETTINGS: Vent Mode:  [-] PRVC FiO2 (%):  [40 %] 40 % Set Rate:  [20 bmp] 20 bmp Vt Set:  [430 mL-730 mL] 430 mL PEEP:  [5 cmH20] 5 cmH20 Plateau Pressure:  [12 cmH20-16 cmH20] 12 cmH20  INTAKE / OUTPUT: I/O last 3 completed shifts: In: 4425.7 [I.V.:3499.3; NG/GT:210; IV Piggyback:253] Out: 2520 [Urine:570; Emesis/NG output:900; Drains:1050]  PHYSICAL  EXAMINATION: General:  Sedated. No distress. Nurses at bedside. Neuro:  Sedated. Pupils equal. No withdrawal to pain in extremities. HEENT:  ETT in place. No scleral icterus. Cardiovascular:  Tachycardic w/ irregular rhythm. Atrial fibrillation on telemetry. No edema.  Lungs:  Clear to auscultation bilaterally. Symmetric chest rise on ventilator. Abdomen:  Soft. Wound Vac in place. Hypoactive BS. Integument:  Warm & dry. No rash on exposed skin.  LABS:  BMET  Recent Labs Lab 07/19/15 1040 07/20/15 0501 07/21/15 0440  NA 139 139  139 139  K 2.9* 4.1  4.1 3.6  CL 112* 112*  112* 113*  CO2 20* 19*  19* 18*  BUN 17 21*  21* 24*  CREATININE 1.59* 1.91*  1.88* 1.84*  GLUCOSE 190* 224*  219* 195*    Electrolytes  Recent Labs Lab 07/19/15 1040 07/20/15 0501 07/20/15 0810 07/21/15 0440  CALCIUM 6.6* 6.4*  6.5*  --  6.4*  MG  --   --  1.5* 2.1  PHOS  --  4.3  --  3.7    CBC  Recent Labs Lab 07/19/15 1040 07/20/15 0501 07/21/15 0440  WBC 5.7 8.0 13.6*  HGB 9.4* 8.7* 8.3*  HCT 31.0* 28.4* 27.1*  PLT 276 246 246    Coag's No results for input(s): APTT, INR in the last 168 hours.  Sepsis Markers  Recent Labs Lab 07/19/15 1847  LATICACIDVEN 1.5    ABG  Recent Labs Lab 07/19/15 0753  07/19/15 1040 07/20/15 0555  PHART 7.152* 7.264* 7.333*  PCO2ART 47.8* 48.2* 36.6  PO2ART 373.0* 472.0* 156.0*    Liver Enzymes  Recent Labs Lab 07/20/15 0501 07/21/15 0440  AST 218* 104*  ALT 146* 106*  ALKPHOS 133* 148*  BILITOT 0.4 0.3  ALBUMIN 1.5*  1.5* 1.2*    Cardiac Enzymes  Recent Labs Lab 07/19/15 1040 07/19/15 1536 07/19/15 2138  TROPONINI 0.06* 0.06* 0.06*    Glucose  Recent Labs Lab 07/20/15 0834 07/20/15 1227 07/20/15 1624 07/20/15 1951 07/20/15 2335 07/21/15 0326  GLUCAP 210* 150* 150* 154* 171* 156*    Imaging No results found.   STUDIES:  CT ABD/Pelvis 5/28 (OSH): bowel perforation with large volume free air,  free fluid, extraluminal enteric contrast in the pelvis, known intra-abdominal metastatic disease, bilateral adrenal masses, liver lesion on prior exam is not as well visualized, cholelithiasis with gallbladder distention.    PORT CXR 5/29:  Endotracheal tube in good position. R IJ CVL in right atrium. NGT coursing below stomach. EKG 5/29:  A fib w/ RVR. PORT CXR 5/30:  R IJ CVL in SVC. ETT in good position.  MICROBIOLOGY: MRSA PCR 5/28:  Negative   ANTIBIOTICS: Zosyn 5/28 >>  Diflucan 5/28 (perf) >>   SIGNIFICANT EVENTS: 5/28  Re-Admit with abd pain, found to have perforation of anastomosis. Returned to ICU on vent post-op  LINES/TUBES: ETT 5/28 >>  R IJ CVC 5/28 >>  NGT R 5/28 >> FOLEY 5/28 >> L FEM ART LINE 5/28 >> PIV x1  ASSESSMENT / PLAN:  PULMONARY A: Acute Respiratory Failure  P:   PRVC 8 cc/kg  Wean PEEP / FiO2 for sats > 94% Increase RR to 20  Intermittent CXR   CARDIOVASCULAR A:  A fib w/ RVR Paroxysmal - No h/o A fib. H/O HTN  P:  NS 500cc bolus May need Amio gtt Telemetry monitoring Vitals per unit protocol Hold home lopressor, lipitor   RENAL A:   Acute Renal Failure - Secondary to hypovolemia.  Metabolic Acidosis - Stable. Hypokalemia - Replacing IV. Hypomagnesemia - Resolved.  P:   Trending electrolytes & renal function daily Trending UOP with Foley KCl 24mEq IV x2 runs  GASTROINTESTINAL A:   Metastatic Carcinoma with Small Bowel Obstruction  Transaminitis  P:   Post Op Mgt per CCS NPO Protonix IV daily Trending LFTs daily  HEMATOLOGIC/ONCOLOGIC A:   Anemia - No signs of active bleeding. Hx Breast Cancer   P:  Monitor CBC Transfuse per ICU guidelines Heparin [place] q8hr SCDs  INFECTIOUS A:   Peritonitis - s/p perforation of anastomosis in the setting of metastatic carcinoma   P:   Zosyn & Diflucan Day #3 Monitor fever curve / WBC   ENDOCRINE A:   Hypothyroidism  DM II  - BG uncontrolled.  P:   Synthroid 58mcg  IV daily Accu-Checks q4hr Moderate dose SSI per algorithm Hold home glipizide, actos NS @ 75cc/hr  NEUROLOGIC A:   Acute Encephalopathy - post op small bowel resection  P:   RASS goal: -1 Propofol gtt for sedation  PRN fentanyl for pain    FAMILY  - Updates:  No family at bedside am 5/30.    - Inter-disciplinary family meet or Palliative Care meeting due by:  6/3  TODAY'S SUMMARY:  [name] with PMH of breast cancer with recent findings of metastatic carcinoma / small bowel obstruction with ex-lap and resection on 5/19.  Returns to [facility] on 5/28 with perforation of anastomosis  with free air / fluid. To OR 5/28 for repeat ex-lap and resection of anastomosis. May require amiodarone infusion for SBT/atrial fibrillation. Patient going back to the operating room today for further surgical repair.  I have spent a total of 32 minutes of critical care time caring for the patient and reviewing the patient's electronic medical record.  Sonia Baller Ashok Cordia, M.D. [facility] Pulmonary & Critical Care Pager:  [phone] After 3pm or if no response, call [phone] 7:03 AM 07/21/2015

## 2015-07-21 NOTE — Progress Notes (Signed)
PARENTERAL NUTRITION CONSULT NOTE - INITIAL  Pharmacy Consult for TPN Indication: Prolonged ileus  No Known Allergies  Patient Measurements: Height: 5\' 4"  (162.6 cm) Weight: 192 lb 3.9 oz (87.2 kg) IBW/kg (Calculated) : 54.7 Adjusted Body Weight: 74 Usual Weight: 76kg preop on 5/28  Vital Signs: Temp: 98.7 F (37.1 C) (05/30 0400) Temp Source: Oral (05/30 0400) BP: 114/71 mmHg (05/30 0714) Pulse Rate: 79 (05/30 0714)  Medical History: Past Medical History  Diagnosis Date  . Cancer (HCC)     breast  . Hypertension   . Diabetes mellitus   . Arthritis   . Breast cancer ([place])   . CKD (chronic kidney disease)   . Hypothyroidism 06/22/2015    Insulin Requirements in the past 24 hours:  18 units of SSI  Admit:  70yo female admitted 5/28 with abdominal pain s/p SBR for malignant SB obstruction (metastatic breast CA) on 5/19 and d/c to SNF 3 days prior.  She developed severe abdominal pain with CT (+)free air and free fluid.  Pt is now s/p exp lap for perforated viscus with peritonitis- resection of anastamosis and placement of wound VAC. She is to return to OR 5/30.  Assessment:  Pt with mostly poor PO intake over 5/19 admission and expected same with development of abdominal discomfort and pain during her 3 days at SNF, as well as expectation for prolonged ileus.  Pt is to start TPN.  Surgeries/Procedures: 5/19  Small bowel resection for malignant SBO (primary: breast CA) 5/28  Emergent exp lap with resection of anastamosis, wound VAC  GI:  Wound VAC in place, abd soft, hypoactive BS. Albumin is low at 1.2, prealbumin 2.5. NG output was 578ml yesterday, wound VAC down to 678ml in last 24 hrs.  Return to OR 5/30.  PPI-IV Endo:  Hx DM, CBGs controlled (150-170s) hypothyroid.  Levothyroxine IV. Lytes: wnl exc K low at 3.6 (goal > 4) Mg (goal > 2) and Phos ok. CoCa 8.7 but given Ca replacement this am. Renal:  Acute on chronic kidney disease.  SCr up to 1.84, CrCl ~14ml/min. NS  at 75ml/hr Pulm:  Intubated Cards:  Hx HTN.  AFib with RVR.  Neo gtt off Hepatobil:  LFTs elevated but trending down. TBili wnl.  TG up to 125. Neuro:  Sedated.  Propofol 9.35ml/hr (66mcg/kg/min), Fent PRN ID:  Day #2 of Zosyn for peritonitis.  Afebrile, WBC up to 13.6.  Best Practices:  Heparin SQ, mouthcare TPN Access:  CVL 5/28 TPN start date:  5/29 >>  Current Nutrition:  NPO Clinimix E 5/15 at 35ml/hr Propofol will provide 240 kcal per day, at current rate.  Nutritional Goals: per RD recs on 5/29 KCal: 1602 Protein: 105-120 g  Plan:  Increase Clinimix E 5/15 to 56ml/hr Hold 20% lipid emulsion for first 7 days for ICU patients per ASPEN guidelines (Start date 6/5)  Continue propofol per MD Decrease NS to KVO when TPN increased tonight TPN and propofol provides 78 g of protein and ~1340 kCals per day meeting 70% of protein and 84% of kCal needs Continue MVI and TE in TPN Increase to 20 units of regular insulin in TPN Continue moderate SSI and adjust as needed Monitor TPN labs, Bmet tomorrow F/U improvement in ileus, advancement of diet  Give KCl 14mEq x 2 runs  Elenor Quinones, PharmD, [facility] Clinical Pharmacist Pager [phone] 07/21/2015 7:58 AM

## 2015-07-21 NOTE — Anesthesia Postprocedure Evaluation (Signed)
Anesthesia Post Note  Patient: Public affairs consultant  Procedure(s) Performed: Procedure(s) (LRB): REEXPLORATION ABDOMINAL WOUND/POSSIBLE BOWEL ANASTAMOSIS (N/A)  Patient location during evaluation: SICU Anesthesia Type: General Level of consciousness: sedated Pain management: pain level controlled Vital Signs Assessment: post-procedure vital signs reviewed and stable Respiratory status: patient remains intubated per anesthesia plan Cardiovascular status: stable Anesthetic complications: no    Last Vitals:  Filed Vitals:   07/21/15 0800 07/21/15 0900  BP: 112/65 110/66  Pulse: 82   Temp: 37.7 C   Resp: 17 25    Last Pain:  Filed Vitals:   07/21/15 1118  PainSc: Asleep                 Tiajuana Amass

## 2015-07-21 NOTE — Progress Notes (Signed)
Patient removed from vent to take to OR by CRNA. Patient transported using ambu bag for ventilation.

## 2015-07-21 NOTE — Transfer of Care (Signed)
Immediate Anesthesia Transfer of Care Note  Patient: Monica Morrison  Procedure(s) Performed: Procedure(s): REEXPLORATION ABDOMINAL WOUND/POSSIBLE BOWEL ANASTAMOSIS (N/A)  Patient Location: SICU  Anesthesia Type:General  Level of Consciousness: Patient remains intubated per anesthesia plan  Airway & Oxygen Therapy: Patient remains intubated per anesthesia plan and Patient placed on Ventilator (see vital sign flow sheet for setting)  Post-op Assessment: Report given to RN and Post -op Vital signs reviewed and stable  Post vital signs: Reviewed and stable  Last Vitals:  Filed Vitals:   07/21/15 0800 07/21/15 0900  BP: 112/65 110/66  Pulse: 82   Temp: 37.7 C   Resp: 17 25    Last Pain:  Filed Vitals:   07/21/15 1118  PainSc: Asleep         Complications: No apparent anesthesia complications

## 2015-07-21 NOTE — Progress Notes (Signed)
Patient went into Atrial Fib with rate of 100s-110s. MD Nestor at bedside and notified. Advised 500cc bolus of NS and possible amiodarone if no conversion. OR picked up patient while bolus still infusing. CRNA made aware  Levon Hedger RN

## 2015-07-21 NOTE — Brief Op Note (Signed)
07/19/2015 - 07/21/2015  11:35 AM  PATIENT:  Monica Morrison  70 y.o. female  PRE-OPERATIVE DIAGNOSIS:  Open abdomen, h/o anastomotic leak after small bowel resection  POST-OPERATIVE DIAGNOSIS:  same  PROCEDURE:  Procedure(s): REEXPLORATION ABDOMINAL WOUND/ CREATION of SMALL BOWEL ANASTAMOSIS (N/A) ABDOMINAL WALL CLOSURE  SURGEON:  Surgeon(s) and Role:    * Greer Pickerel, MD - Primary    * Georganna Skeans, MD - Assisting  PHYSICIAN ASSISTANT:   ASSISTANTS: SEE ABOVE   ANESTHESIA:   general  EBL:  Total I/O In: 948.2 [I.V.:788.2; NG/GT:30; IV Piggyback:50; TPN:80] Out: 350 [Urine:125; Emesis/NG output:150; Drains:50; Blood:25]  BLOOD ADMINISTERED:none  DRAINS: none   LOCAL MEDICATIONS USED:  NONE  SPECIMEN:  No Specimen  DISPOSITION OF SPECIMEN:  N/A  COUNTS:  YES  TOURNIQUET:  * No tourniquets in log *  DICTATION: .Dragon Dictation  PLAN OF CARE: ICU  PATIENT DISPOSITION:  ICU - intubated and hemodynamically stable.   Delay start of Pharmacological VTE agent (>24hrs) due to surgical blood loss or risk of bleeding: no  Leighton Ruff. Redmond Pulling, MD, FACS General, Bariatric, & Minimally Invasive Surgery [facility] Surgery, [place]

## 2015-07-21 NOTE — Progress Notes (Signed)
2 Days Post-Op  Subjective: Off neo; in/out low rate Afib.   Objective: Vital signs in last 24 hours: Temp:  [98.1 F (36.7 C)-99 F (37.2 C)] 98.7 F (37.1 C) (05/30 0400) Pulse Rate:  [70-122] 79 (05/30 0714) Resp:  [15-25] 21 (05/30 0714) BP: (84-130)/(50-81) 114/71 mmHg (05/30 0714) SpO2:  [98 %-100 %] 100 % (05/30 0714) Arterial Line BP: (82-159)/(39-71) 137/63 mmHg (05/30 0700) FiO2 (%):  [40 %] 40 % (05/30 0714) Last BM Date:  (unknown)  Intake/Output from previous day: 05/29 0701 - 05/30 0700 In: 2704.9 [I.V.:1808.5; NG/GT:120; IV Piggyback:313; TPN:463.3] Out: 1575 [Urine:425; Emesis/NG output:500; Drains:650] Intake/Output this shift:    Intubated, sedated cta b/l Low tachycardia Soft, distended, open abd with wound vac - intact; serous drainage in vac No edema  Lab Results:   Recent Labs  07/20/15 0501 07/21/15 0440  WBC 8.0 13.6*  HGB 8.7* 8.3*  HCT 28.4* 27.1*  PLT 246 246   BMET  Recent Labs  07/20/15 0501 07/21/15 0440  NA 139  139 139  K 4.1  4.1 3.6  CL 112*  112* 113*  CO2 19*  19* 18*  GLUCOSE 224*  219* 195*  BUN 21*  21* 24*  CREATININE 1.91*  1.88* 1.84*  CALCIUM 6.4*  6.5* 6.4*   Hepatic Function Latest Ref Rng 07/21/2015 07/20/2015 07/20/2015  Total Protein 6.5 - 8.1 g/dL 4.2(L) - 4.1(L)  Albumin 3.5 - 5.0 g/dL 1.2(L) 1.5(L) 1.5(L)  AST 15 - 41 U/L 104(H) - 218(H)  ALT 14 - 54 U/L 106(H) - 146(H)  Alk Phosphatase 38 - 126 U/L 148(H) - 133(H)  Total Bilirubin 0.3 - 1.2 mg/dL 0.3 - 0.4     PT/INR No results for input(s): LABPROT, INR in the last 72 hours. ABG  Recent Labs  07/19/15 1040 07/20/15 0555  PHART 7.264* 7.333*  HCO3 22.0 19.4*    Studies/Results: Dg Chest Port 1 View  07/21/2015  CLINICAL DATA:  Follow-up of respiratory failure, history of small bowel obstruction with perforation, peritonitis, breast malignancy. EXAM: PORTABLE CHEST 1 VIEW COMPARISON:  Portable chest x-ray of Jul 20, 2015 FINDINGS:  The right hemidiaphragm remains higher than the left. There remains right perihilar interstitial density inferiorly. On the left the lung is clear. There is no pleural effusion. The heart is top-normal in size. The pulmonary vascularity is normal. The endotracheal tube tip lies 1.9 cm above the carina. The esophagogastric tube tip projects below the inferior margin of the image with the proximal port just below the GE junction. The right internal jugular venous catheter tip projects over the midportion of the SVC. There is significant degenerative change of both shoulders. IMPRESSION: 1. Improved right perihilar and infrahilar infiltrate. There is no pleural effusion or pneumothorax. No pulmonary edema. 2. Advancement of the nasogastric tube by 10 cm is recommended to assure that the proximal port remains below the GE junction. The other support tubes are in reasonable position. Electronically Signed   By: David  Martinique M.D.   On: 07/21/2015 07:25   Dg Chest Port 1 View  07/20/2015  CLINICAL DATA:  Respiratory failure EXAM: PORTABLE CHEST 1 VIEW COMPARISON:  07/19/2015 FINDINGS: Cardiac shadow is stable. The endotracheal tube lies approximately 1 cm above the carina. A nasogastric catheter and right-sided jugular line are again seen and stable. The lungs are well aerated with some stable right basilar atelectasis. No new focal infiltrate is seen. Heavy calcification of the mitral annulus is noted. IMPRESSION: No significant change from  the prior exam aside from the endotracheal tube being slightly withdrawn Electronically Signed   By: Inez Catalina M.D.   On: 07/20/2015 07:40   Dg Chest Port 1 View  07/19/2015  CLINICAL DATA:  Encounter for central line placement. EXAM: PORTABLE CHEST 1 VIEW COMPARISON:  Jul 10, 2015. FINDINGS: Stable cardiomediastinal silhouette. Interval placement of endotracheal tube which is directed into right mainstem bronchus; withdrawal by 2-3 cm is recommended. Interval placement of  nasogastric tube which is seen entering stomach. Interval placement of right internal jugular catheter with distal tip in right atrium. Withdrawal by 2-3 cm is recommended as well. No pneumothorax or significant pleural effusion is noted. Mild right basilar subsegmental atelectasis is noted. Narrowing of the subacromial space bilaterally consistent with rotator cuff injury. IMPRESSION: Endotracheal tube is directed into right mainstem bronchus and withdrawal by 2-3 cm is recommended. Interval placement of right internal jugular catheter with distal tip in right atrium, and withdrawal by 2-3 cm is recommended. Critical Value/emergent results were called by telephone at the time of interpretation on 07/19/2015 at 9:17 am to Benjaman Pott, the patient's nurse, who verbally acknowledged these results and will contact the patient's physician. Electronically Signed   By: Marijo Conception, M.D.   On: 07/19/2015 09:18    Anti-infectives: Anti-infectives    Start     Dose/Rate Route Frequency Ordered Stop   07/19/15 1000  piperacillin-tazobactam (ZOSYN) IVPB 3.375 g     3.375 g 12.5 mL/hr over 240 Minutes Intravenous Every 8 hours 07/19/15 0828     07/19/15 0608  dextrose 5 % with cefOXitin (MEFOXIN) ADS Med    Comments:  [name]   : cabinet override      07/19/15 1700 07/19/15 0629      Assessment/Plan: SBO due to tumor, metastatic cancer, primary likely from breast.  exploratory laparotomy, SBR---Dr. Grandville Silos 07/10/15  Breakdown of previous small bowel anastomosis and peritonitis  POD#2Exploratory laparotomy with resection of anastomosis and placement of wound vac--Dr. 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 OR today Acute renal failure-monitor UOP Metastatic breast cancer/carcinomatosis AF with RVR-new this AM,  appreciate CCM management VTE prophylaxis-SCD/heparin, hydrate  Dispo-ICU  Spoke with ED on phone - discussed risk/benefits of surgery. Bleeding, infection, potential need for resection, leak, hernia, dehiscence, heart/lung events, death, renal failure, need for addl procedures. Explained that she is at [place] for complications given the fact she blew out anastomosis and severe PCMN. He voiced understanding.   Monica Morrison. Redmond Pulling, MD, FACS General, Bariatric, & Minimally Invasive Surgery [facility] Surgery, [place]   LOS: 2 days    Gayland Curry 07/21/2015

## 2015-07-21 NOTE — Anesthesia Preprocedure Evaluation (Addendum)
Anesthesia Evaluation  Patient identified by MRN, date of birth, ID band Patient awake    Reviewed: Allergy & Precautions, NPO status , Patient's Chart, lab work & pertinent test results, reviewed documented beta blocker date and time   History of Anesthesia Complications Negative for: history of anesthetic complications  Airway Mallampati: Intubated  TM Distance: >3 FB Neck ROM: Full    Dental  (+) Implants, Dental Advisory Given   Pulmonary neg pulmonary ROS,    breath sounds clear to auscultation       Cardiovascular hypertension, Pt. on medications and Pt. on home beta blockers (-) angina Rhythm:Regular Rate:Tachycardia  '06 ECHO: EF 55-60%, valves OK   Neuro/Psych negative neurological ROS  negative psych ROS   GI/Hepatic Neg liver ROS, H/o ex lap 07/10/15 for SBO, now with perf viscus   Endo/Other  diabetes, Oral Hypoglycemic AgentsHypothyroidism   Renal/GU      Musculoskeletal  (+) Arthritis ,   Abdominal   Peds  Hematology  (+) Blood dyscrasia (Hb 9.1), ,   Anesthesia Other Findings Breast cancer: surgery and chemo Endometrial cancer: surgery  Reproductive/Obstetrics                            Anesthesia Physical  Anesthesia Plan  ASA: III  Anesthesia Plan: General   Post-op Pain Management:    Induction: Intravenous  Airway Management Planned: Oral ETT  Additional Equipment:   Intra-op Plan:   Post-operative Plan: Post-operative intubation/ventilation  Informed Consent: I have reviewed the patients History and Physical, chart, labs and discussed the procedure including the risks, benefits and alternatives for the proposed anesthesia with the patient or authorized representative who has indicated his/her understanding and acceptance.   Dental advisory given  Plan Discussed with: CRNA  Anesthesia Plan Comments:         Anesthesia Quick Evaluation

## 2015-07-21 NOTE — Progress Notes (Signed)
Patient arrived back to unit from OR and placed back on full vent support.

## 2015-07-21 NOTE — Op Note (Signed)
07/19/2015 - 07/21/2015  11:40 AM  PATIENT:  Monica Morrison  70 y.o. female  PRE-OPERATIVE DIAGNOSIS:  Open abdomen, h/o breakdown of prior small bowel anastomosis s/p resection of anastomosis 5/28  POST-OPERATIVE DIAGNOSIS: same  PROCEDURE:  Procedure(s): REEXPLORATION ABDOMEN CREATION OF SMALL BOWEL ANASTOMOSIS ABDOMINAL WALL CLOSURE  SURGEON:  Surgeon(s): Greer Pickerel, MD   ASSISTANTS: Georganna Skeans, MD   ANESTHESIA:   general  DRAINS: Nasogastric Tube and Urinary Catheter (Foley)   LOCAL MEDICATIONS USED:  NONE  SPECIMEN:  No Specimen  DISPOSITION OF SPECIMEN:  N/A  COUNTS:  YES  INDICATION FOR PROCEDURE: 70 year old lady with a history of exploratory laparotomy with small bowel resection on May 19 by Dr. Grandville Silos for an obstructing small bowel tumor due to metastatic breast cancer. Her postop recovery was unremarkable until she developed fever, nausea and vomiting and abdominal pain. She was found to have free air and was taken back to the operating room emergently on May 28 where she was found to have breakdown of her small bowel anastomosis with gross contamination. The small bowel anastomosis was resected and she was left in discontinuity and an open abdominal wound VAC was placed. She was taken to the intensive care unit for resuscitation. She is brought back today for second look and potential small bowel anastomosis with potential small bowel resection. I discussed at length the risk and benefits with the son. We discussed the risk of leak, stricture, hernia, dehiscence, blood clots, prolonged hospitalization, renal failure, perioperative cardiac and pulmonary events, death. We discussed that she was very high risk for complications given her severe protein calorie malnutrition and her recent anastomotic breakdown  PROCEDURE: After obtaining informed consent she was taken back to the operating room from the intensive care unit and placed supine on the operating room table.  Her endotracheal tube was connected to the anesthesia circuit and full general anesthesia was established. She was on therapeutic antibiotics. Her current open abdominal wound VAC was removed. Her abdomen was prepped and draped in the usual standard surgical fashion with Betadine. A surgical timeout was performed. Her abdomen was reexplored. There is no evidence of ongoing contamination. The small bowel appeared viable. There was no fecal contamination. The abdomen was copiously irrigated with 4 L of saline. The previously stapled off mid small bowel was identified. The small bowel had already become thickened and inflamed however it appeared viable. Because of how the proximal end was laying in the abdomen I decided to do a side-to-side small bowel anastomosis about an inch from the staple lines. An enterotomy was made into the proximal and distal and of the small bowel. A GIA-75 stapler was advanced through through each enterotomy and brought together and fired to create a common enterotomy. The staple line was hemostatic. I decided to close the common defect with multiple interrupted 3-0 silk sutures. A 3-0 silk suture was placed in the crotch of the anastomosis. The anastomosis and suture line were coated in Eviceal.  We changed gloves. Seprafilm was placed into the abdominal cavity. The fascia was then closed with a running looped #1 PDS 1 from below and one from above with interrupted Novafil sutures to serve as internal retention sutures. Wet-to-dry Kerlix was placed into the saphenous tissue followed by dry gauze. All needle, instrument, and sponge counts were correct 2. She was taken back to the ICU care unit intubated in stable condition PLAN OF CARE: ICU  PATIENT DISPOSITION:  ICU - intubated and hemodynamically stable.   Delay  start of Pharmacological VTE agent (>24hrs) due to surgical blood loss or risk of bleeding:  No  Leighton Ruff. Redmond Pulling, MD, [place], Bariatric, & Minimally Invasive  Surgery [facility], [place]   Leighton Ruff. Redmond Pulling, MD, FACS General, Bariatric, & Minimally Invasive Surgery [facility], [place]

## 2015-07-22 DIAGNOSIS — A419 Sepsis, unspecified organism: Secondary | ICD-10-CM

## 2015-07-22 LAB — GLUCOSE, CAPILLARY
GLUCOSE-CAPILLARY: 267 mg/dL — AB (ref 65–99)
GLUCOSE-CAPILLARY: 259 mg/dL — AB (ref 65–99)
GLUCOSE-CAPILLARY: 196 mg/dL — AB (ref 65–99)
Glucose-Capillary: 229 mg/dL — ABNORMAL HIGH (ref 65–99)
Glucose-Capillary: 262 mg/dL — ABNORMAL HIGH (ref 65–99)
Glucose-Capillary: 243 mg/dL — ABNORMAL HIGH (ref 65–99)

## 2015-07-22 LAB — COMPREHENSIVE METABOLIC PANEL
ALK PHOS: 253 U/L — AB (ref 38–126)
ALT: 79 U/L — ABNORMAL HIGH (ref 14–54)
ANION GAP: 8 (ref 5–15)
AST: 58 U/L — ABNORMAL HIGH (ref 15–41)
Albumin: 1.2 g/dL — ABNORMAL LOW (ref 3.5–5.0)
BILIRUBIN TOTAL: 0.3 mg/dL (ref 0.3–1.2)
BUN: 25 mg/dL — ABNORMAL HIGH (ref 6–20)
CALCIUM: 6.8 mg/dL — AB (ref 8.9–10.3)
CO2: 18 mmol/L — ABNORMAL LOW (ref 22–32)
Chloride: 109 mmol/L (ref 101–111)
Creatinine, Ser: 1.72 mg/dL — ABNORMAL HIGH (ref 0.44–1.00)
GFR calc non Af Amer: 29 mL/min — ABNORMAL LOW (ref >60)
GFR, EST AFRICAN AMERICAN: 34 mL/min — AB (ref >60)
Glucose, Bld: 289 mg/dL — ABNORMAL HIGH (ref 65–99)
POTASSIUM: 3.7 mmol/L (ref 3.5–5.1)
SODIUM: 135 mmol/L (ref 135–145)
TOTAL PROTEIN: 4.4 g/dL — AB (ref 6.5–8.1)

## 2015-07-22 LAB — PHOSPHORUS: Phosphorus: 4.6 mg/dL (ref 2.5–4.6)

## 2015-07-22 LAB — MAGNESIUM: Magnesium: 1.9 mg/dL (ref 1.7–2.4)

## 2015-07-22 LAB — TRIGLYCERIDES: TRIGLYCERIDES: 175 mg/dL — AB (ref <150)

## 2015-07-22 MED ORDER — TRACE MINERALS CR-CU-MN-SE-ZN 10-1000-500-60 MCG/ML IV SOLN
INTRAVENOUS | Status: AC
  Administered 2015-07-22: 17:00:00 via INTRAVENOUS
  Filled 2015-07-22: qty 1560

## 2015-07-22 MED ORDER — POTASSIUM CHLORIDE 10 MEQ/50ML IV SOLN
10.0000 meq | INTRAVENOUS | Status: AC
  Administered 2015-07-22 (×3): 10 meq via INTRAVENOUS
  Filled 2015-07-22: qty 50

## 2015-07-22 MED ORDER — MAGNESIUM SULFATE 2 GM/50ML IV SOLN
2.0000 g | Freq: Once | INTRAVENOUS | Status: AC
  Administered 2015-07-22: 2 g via INTRAVENOUS
  Filled 2015-07-22: qty 50

## 2015-07-22 MED ORDER — INSULIN GLARGINE 100 UNIT/ML ~~LOC~~ SOLN
10.0000 [IU] | Freq: Every day | SUBCUTANEOUS | Status: DC
  Administered 2015-07-22 – 2015-07-23 (×2): 10 [IU] via SUBCUTANEOUS
  Filled 2015-07-22 (×3): qty 0.1

## 2015-07-22 NOTE — Progress Notes (Signed)
Patient ID: Monica Morrison, female   DOB: Jan 28, 1946, 70 y.o.   MRN: [id number]     [address] [place], [place] [address]    Phone: [phone] FAX: [phone]     Subjective: Propofol stopped, not waking up yet. 930m uop. 4091mngt output. Afebrile.  sCr down 1.72.    Objective:  Vital signs:  Filed Vitals:   07/22/15 0600 07/22/15 0700 07/22/15 0717 07/22/15 0753  BP: 121/72 121/70  121/78  Pulse: 84 81  87  Temp:   98.4 F (36.9 C)   TempSrc:   Oral   Resp: _0 Height:      Weight:      SpO2: 100% 100%  100%    Last BM Date: 07/21/15  Intake/Output   Yesterday:  05/30 0701 - 05/31 0700 In: 1663.6 [I.V.:966.7; NG/GT:90; IV Piggyback:100; TPN:506.8] Out: 145038Urine:960; Emesis/NG output:400; Drains:50; Blood:25] This shift:      Physical Exam: General: Pt sedated Chest: vent, no crackles or wheezes  CV:  Pulses intact.  Regular rhythm Abdomen: Soft.  Nondistended.  No bs.  Midline wound with intact fascia, clean.   No evidence of peritonitis.  No incarcerated hernias. Ext:  SCDs BLE.  No mjr edema.  No cyanosis Skin: No petechiae / purpura   Problem List:   Active Problems:   Perforated bowel ([facility]  Peritonitis ([facility]   Results:   Labs: Results for orders placed or performed during the hospital encounter of 07/19/15 (from the past 48 hour(s))  Glucose, capillary     Status: Abnormal   Collection Time: 07/20/15 12:27 PM  Result Value Ref Range   Glucose-Capillary 150 (H) 65 - 99 mg/dL   Comment 1 Notify RN   Glucose, capillary     Status: Abnormal   Collection Time: 07/20/15  4:24 PM  Result Value Ref Range   Glucose-Capillary 150 (H) 65 - 99 mg/dL   Comment 1 Notify RN   Glucose, capillary     Status: Abnormal   Collection Time: 07/20/15  7:51 PM  Result Value Ref Range   Glucose-Capillary 154 (H) 65 - 99 mg/dL   Comment 1 Notify RN    Comment 2 Document in  Chart   Glucose, capillary     Status: Abnormal   Collection Time: 07/20/15 11:35 PM  Result Value Ref Range   Glucose-Capillary 171 (H) 65 - 99 mg/dL   Comment 1 Notify RN    Comment 2 Document in Chart   Glucose, capillary     Status: Abnormal   Collection Time: 07/21/15  3:26 AM  Result Value Ref Range   Glucose-Capillary 156 (H) 65 - 99 mg/dL   Comment 1 Notify RN    Comment 2 Document in Chart   CBC     Status: Abnormal   Collection Time: 07/21/15  4:40 AM  Result Value Ref Range   WBC 13.6 (H) 4.0 - 10.5 K/uL   RBC 3.34 (L) 3.87 - 5.11 MIL/uL   Hemoglobin 8.3 (L) 12.0 - 15.0 g/dL   HCT 27.1 (L) 36.0 - 46.0 %   MCV 81.1 78.0 - 100.0 fL   MCH 24.9 (L) 26.0 - 34.0 pg   MCHC 30.6 30.0 - 36.0 g/dL   RDW 18.3 (H) 11.5 - 15.5 %   Platelets 246 150 - 400 K/uL  Magnesium     Status: None   Collection  Time: 07/21/15  4:40 AM  Result Value Ref Range   Magnesium 2.1 1.7 - 2.4 mg/dL  Comprehensive metabolic panel     Status: Abnormal   Collection Time: 07/21/15  4:40 AM  Result Value Ref Range   Sodium 139 135 - 145 mmol/L   Potassium 3.6 3.5 - 5.1 mmol/L   Chloride 113 (H) 101 - 111 mmol/L   CO2 18 (L) 22 - 32 mmol/L   Glucose, Bld 195 (H) 65 - 99 mg/dL   BUN 24 (H) 6 - 20 mg/dL   Creatinine, Ser 1.84 (H) 0.44 - 1.00 mg/dL   Calcium 6.4 (LL) 8.9 - 10.3 mg/dL    Comment: CRITICAL RESULT CALLED TO, READ BACK BY AND VERIFIED WITH: MUELLER R,RN 07/21/15 0525 WAYK    Total Protein 4.2 (L) 6.5 - 8.1 g/dL   Albumin 1.2 (L) 3.5 - 5.0 g/dL   AST 104 (H) 15 - 41 U/L   ALT 106 (H) 14 - 54 U/L   Alkaline Phosphatase 148 (H) 38 - 126 U/L   Total Bilirubin 0.3 0.3 - 1.2 mg/dL   GFR calc non Af Amer 27 (L) >60 mL/min   GFR calc Af Amer 31 (L) >60 mL/min    Comment: (NOTE) The eGFR has been calculated using the CKD EPI equation. This calculation has not been validated in all clinical situations. eGFR's persistently <60 mL/min signify possible Chronic Kidney Disease.    Anion gap 8  5 - 15  Phosphorus     Status: None   Collection Time: 07/21/15  4:40 AM  Result Value Ref Range   Phosphorus 3.7 2.5 - 4.6 mg/dL  Triglycerides     Status: None   Collection Time: 07/21/15  4:40 AM  Result Value Ref Range   Triglycerides 125 <150 mg/dL  Glucose, capillary     Status: Abnormal   Collection Time: 07/21/15 11:54 AM  Result Value Ref Range   Glucose-Capillary 198 (H) 65 - 99 mg/dL  Glucose, capillary     Status: Abnormal   Collection Time: 07/21/15  4:00 PM  Result Value Ref Range   Glucose-Capillary 181 (H) 65 - 99 mg/dL  Glucose, capillary     Status: Abnormal   Collection Time: 07/21/15  7:50 PM  Result Value Ref Range   Glucose-Capillary 243 (H) 65 - 99 mg/dL   Comment 1 Capillary Specimen   Glucose, capillary     Status: Abnormal   Collection Time: 07/21/15 11:23 PM  Result Value Ref Range   Glucose-Capillary 229 (H) 65 - 99 mg/dL   Comment 1 Capillary Specimen   Glucose, capillary     Status: Abnormal   Collection Time: 07/22/15  3:51 AM  Result Value Ref Range   Glucose-Capillary 267 (H) 65 - 99 mg/dL   Comment 1 Arterial Specimen   Magnesium     Status: None   Collection Time: 07/22/15  3:53 AM  Result Value Ref Range   Magnesium 1.9 1.7 - 2.4 mg/dL  Comprehensive metabolic panel     Status: Abnormal   Collection Time: 07/22/15  3:53 AM  Result Value Ref Range   Sodium 135 135 - 145 mmol/L   Potassium 3.7 3.5 - 5.1 mmol/L   Chloride 109 101 - 111 mmol/L   CO2 18 (L) 22 - 32 mmol/L   Glucose, Bld 289 (H) 65 - 99 mg/dL   BUN 25 (H) 6 - 20 mg/dL   Creatinine, Ser 1.72 (H) 0.44 - 1.00 mg/dL  Calcium 6.8 (L) 8.9 - 10.3 mg/dL   Total Protein 4.4 (L) 6.5 - 8.1 g/dL   Albumin 1.2 (L) 3.5 - 5.0 g/dL   AST 58 (H) 15 - 41 U/L   ALT 79 (H) 14 - 54 U/L   Alkaline Phosphatase 253 (H) 38 - 126 U/L   Total Bilirubin 0.3 0.3 - 1.2 mg/dL   GFR calc non Af Amer 29 (L) >60 mL/min   GFR calc Af Amer 34 (L) >60 mL/min    Comment: (NOTE) The eGFR has been  calculated using the CKD EPI equation. This calculation has not been validated in all clinical situations. eGFR's persistently <60 mL/min signify possible Chronic Kidney Disease.    Anion gap 8 5 - 15  Phosphorus     Status: None   Collection Time: 07/22/15  3:53 AM  Result Value Ref Range   Phosphorus 4.6 2.5 - 4.6 mg/dL  Triglycerides     Status: Abnormal   Collection Time: 07/22/15  3:53 AM  Result Value Ref Range   Triglycerides 175 (H) <150 mg/dL    Imaging / Studies: Dg Chest Port 1 View  07/21/2015  CLINICAL DATA:  Follow-up of respiratory failure, history of small bowel obstruction with perforation, peritonitis, breast malignancy. EXAM: PORTABLE CHEST 1 VIEW COMPARISON:  Portable chest x-ray of Jul 20, 2015 FINDINGS: The right hemidiaphragm remains higher than the left. There remains right perihilar interstitial density inferiorly. On the left the lung is clear. There is no pleural effusion. The heart is top-normal in size. The pulmonary vascularity is normal. The endotracheal tube tip lies 1.9 cm above the carina. The esophagogastric tube tip projects below the inferior margin of the image with the proximal port just below the GE junction. The right internal jugular venous catheter tip projects over the midportion of the SVC. There is significant degenerative change of both shoulders. IMPRESSION: 1. Improved right perihilar and infrahilar infiltrate. There is no pleural effusion or pneumothorax. No pulmonary edema. 2. Advancement of the nasogastric tube by 10 cm is recommended to assure that the proximal port remains below the GE junction. The other support tubes are in reasonable position. Electronically Signed   By: David  Martinique M.D.   On: 07/21/2015 07:25    Medications / Allergies:  Scheduled Meds: . antiseptic oral rinse  7 mL Mouth Rinse QID  . chlorhexidine gluconate (SAGE KIT)  15 mL Mouth Rinse BID  . heparin subcutaneous  5,000 Units Subcutaneous Q8H  . insulin aspart   0-15 Units Subcutaneous Q4H  . insulin glargine  10 Units Subcutaneous QHS  . levothyroxine  56 mcg Intravenous Daily  . magnesium sulfate 1 - 4 g bolus IVPB  2 g Intravenous Once  . pantoprazole (PROTONIX) IV  40 mg Intravenous Q24H  . piperacillin-tazobactam (ZOSYN)  IV  3.375 g Intravenous Q8H  . potassium chloride  10 mEq Intravenous Q1 Hr x 3  . sodium chloride flush  10-40 mL Intracatheter Q12H   Continuous Infusions: . Marland KitchenTPN (CLINIMIX-E) Adult 65 mL/hr at 07/22/15 0200  . Marland KitchenTPN (CLINIMIX-E) Adult    . sodium chloride 10 mL/hr at 07/21/15 1720  . amiodarone 30 mg/hr (07/22/15 0200)  . phenylephrine (NEO-SYNEPHRINE) Adult infusion Stopped (07/21/15 0700)  . propofol (DIPRIVAN) infusion 20 mcg/kg/min (07/22/15 0700)   PRN Meds:.fentaNYL (SUBLIMAZE) injection, fentaNYL (SUBLIMAZE) injection, ondansetron **OR** ondansetron (ZOFRAN) IV, sodium chloride flush  Antibiotics: Anti-infectives    Start     Dose/Rate Route Frequency Ordered Stop   07/19/15 1000  piperacillin-tazobactam (ZOSYN) IVPB 3.375 g     3.375 g 12.5 mL/hr over 240 Minutes Intravenous Every 8 hours 07/19/15 0828     07/19/15 0608  dextrose 5 % with cefOXitin (MEFOXIN) ADS Med    Comments:  [name]   : cabinet override      07/19/15 8315 07/19/15 0629         Assessment/Plan: SBO due to tumor, metastatic cancer, primary likely from breast.  exploratory laparotomy, SBR---Dr. Grandville Silos 07/10/15  Breakdown of previous small bowel anastomosis and peritonitis  POD#3 Exploratory laparotomy with resection of anastomosis and placement of wound vac--Dr. Brantley Stage 07/20/15 POD#1 Re-exploration of abdomen, creation of small bowel anastomosis, abdominal wall closure--Dr. Redmond Pulling 07/21/15 -stable, continue BID wet to dry dressing changes ID-Zosyn D#3 peritonitis  Severe PCM-prealbumin 2.5,PICC, TPN Acute respiratory failure-appreciate CCM management. wean to extubate per CCM Acute renal failure-monitor UOP, improved   Metastatic breast cancer/carcinomatosis AF with RVR-NSR on amio VTE prophylaxis-SCD/heparin, hydrate  Dispo-ICU  Erby Pian, ANP-BC [facility] Pager [phone](7A-4:30P) For consults and floor pages call [phone](7A-4:30P)  07/22/2015 9:28 AM

## 2015-07-22 NOTE — Progress Notes (Signed)
PULMONARY / CRITICAL CARE MEDICINE   Name: Monica Morrison DOB: 02/01/1946    ADMISSION DATE:  07/19/2015 CONSULTATION DATE:  07/19/15  REFERRING MD:  Dr. Brantley Stage   CHIEF COMPLAINT:  Malignant Small Bowel Obstruction  HISTORY OF PRESENT ILLNESS:  [name] with PMH of HTN, DM II, CKD, HTN, arthritis, hypothyroidism, breast cancer and recent admission from 5/19-5/24 for a small bowel obstruction that was found to be metastatic carcinoma s/p exploratory lap / small bowel resection who presented to [facility] on 5/28 with complaints of severe abdominal pain.    The patient was seen in the ER at [facility] for evaluation of abdominal pain.  Initial CT of the abdomen showed free air, a large quantity of free fluid and oral contrast.  Labs - Na 140, K 3.3, Hgb 13.3 / Hct 39, ionized calcium 1.0.  She was transferred to [facility] for operative evaluation. The patient was taken urgently to the OR for exploratory laparotomy with small bowel anastomosis resection.  She returned to the ICU post-operatively on mechanical ventilation.  Post-operatively, she was noted to have runs of SVT & a right mainstem intubation.  Repeat labs pending.    SUBJECTIVE: back to OR yesterday for re-exploration and closure   REVIEW OF SYSTEMS: Unable to obtain given intubation and sedation.  VITAL SIGNS: BP 121/78 mmHg  Pulse 87  Temp(Src) 98.4 F (36.9 C) (Oral)  Resp 17  Ht 5\' 4"  (1.626 m)  Wt 88.7 kg (195 lb 8.8 oz)  BMI 33.55 kg/m2  SpO2 100%  HEMODYNAMICS: CVP:  [7 mmHg-12 mmHg] 10 mmHg  VENTILATOR SETTINGS: Vent Mode:  [-] PRVC FiO2 (%):  [40 %] 40 % Set Rate:  [20 bmp] 20 bmp Vt Set:  [430 mL] 430 mL PEEP:  [5 cmH20] 5 cmH20 Plateau Pressure:  [16 cmH20-22 cmH20] 17 cmH20  INTAKE / OUTPUT: I/O last 3 completed shifts: In: 2928.6 [I.V.:1561.7; NG/GT:120; IV Piggyback:260] Out: 2225 [Urine:1250; Emesis/NG output:600; Drains:350; Blood:25]  PHYSICAL EXAMINATION: General:  Sedated. No  distress.  Neuro:  Sedated. RASS -3, Pupils equal. No withdrawal to pain in extremities. HEENT:  ETT in place. No scleral icterus. Cardiovascular:  s1s2 irreg, Atrial fibrillation on telemetry. No edema.  Lungs:  resps even non labored on vent, Clear to auscultation bilaterally. Symmetric chest rise on ventilator. Abdomen:  Soft. Midline dressing c/d, Hypoactive BS. Integument:  Warm & dry. No rash on exposed skin.  LABS:  BMET  Recent Labs Lab 07/20/15 0501 07/21/15 0440 07/22/15 0353  NA 139  139 139 135  K 4.1  4.1 3.6 3.7  CL 112*  112* 113* 109  CO2 19*  19* 18* 18*  BUN 21*  21* 24* 25*  CREATININE 1.91*  1.88* 1.84* 1.72*  GLUCOSE 224*  219* 195* 289*    Electrolytes  Recent Labs Lab 07/20/15 0501 07/20/15 0810 07/21/15 0440 07/22/15 0353  CALCIUM 6.4*  6.5*  --  6.4* 6.8*  MG  --  1.5* 2.1 1.9  PHOS 4.3  --  3.7 4.6    CBC  Recent Labs Lab 07/19/15 1040 07/20/15 0501 07/21/15 0440  WBC 5.7 8.0 13.6*  HGB 9.4* 8.7* 8.3*  HCT 31.0* 28.4* 27.1*  PLT 276 246 246    Coag's No results for input(s): APTT, INR in the last 168 hours.  Sepsis Markers  Recent Labs Lab 07/19/15 1847  LATICACIDVEN 1.5    ABG  Recent Labs Lab 07/19/15 0753 07/19/15 1040 07/20/15 0555  PHART  7.152* 7.264* 7.333*  PCO2ART 47.8* 48.2* 36.6  PO2ART 373.0* 472.0* 156.0*    Liver Enzymes  Recent Labs Lab 07/20/15 0501 07/21/15 0440 07/22/15 0353  AST 218* 104* 58*  ALT 146* 106* 79*  ALKPHOS 133* 148* 253*  BILITOT 0.4 0.3 0.3  ALBUMIN 1.5*  1.5* 1.2* 1.2*    Cardiac Enzymes  Recent Labs Lab 07/19/15 1040 07/19/15 1536 07/19/15 2138  TROPONINI 0.06* 0.06* 0.06*    Glucose  Recent Labs Lab 07/21/15 0326 07/21/15 1154 07/21/15 1600 07/21/15 1950 07/21/15 2323 07/22/15 0351  GLUCAP 156* 198* 181* 243* 229* 267*    Imaging No results found.   STUDIES:  CT ABD/Pelvis 5/28 (OSH): bowel perforation with large volume free air,  free fluid, extraluminal enteric contrast in the pelvis, known intra-abdominal metastatic disease, bilateral adrenal masses, liver lesion on prior exam is not as well visualized, cholelithiasis with gallbladder distention.    PORT CXR 5/29:  Endotracheal tube in good position. R IJ CVL in right atrium. NGT coursing below stomach. EKG 5/29:  A fib w/ RVR. PORT CXR 5/30:  R IJ CVL in SVC. ETT in good position.  MICROBIOLOGY: MRSA PCR 5/28:  Negative   ANTIBIOTICS: Zosyn 5/28 >>  Diflucan 5/28 (perf) >>   SIGNIFICANT EVENTS: 5/28  Re-Admit with abd pain, found to have perforation of anastomosis. Returned to ICU on vent post-op  LINES/TUBES: ETT 5/28 >>  R IJ CVC 5/28 >>  NGT R 5/28 >> FOLEY 5/28 >> L FEM ART LINE 5/28 >> R PICC 5/30>>>  ASSESSMENT / PLAN:  PULMONARY A: Acute Respiratory Failure  P:   PRVC 8 cc/kg  Wean PEEP / FiO2 for sats > 94% SBT  Intermittent CXR   CARDIOVASCULAR A:  A fib w/ RVR Paroxysmal - No h/o A fib. H/O HTN  P:  Cont amio gtt  Telemetry monitoring Cont hold home lopressor, lipitor   RENAL A:   Acute Renal Failure - Secondary to hypovolemia.  Metabolic Acidosis - Stable. Hypokalemia - Replacing IV. Hypomagnesemia - Resolved.  P:   Trending electrolytes & renal function daily Trending UOP with Foley   GASTROINTESTINAL A:   Metastatic Carcinoma with Small Bowel Obstruction  Transaminitis P:   Post Op Mgt per CCS NPO TPN PPI  Trending LFTs daily  HEMATOLOGIC/ONCOLOGIC A:   Anemia - No signs of active bleeding. Hx Breast Cancer   P:  Monitor CBC Transfuse per ICU guidelines Heparin Car[place]-Moon q8hr SCDs  INFECTIOUS A:   Peritonitis - s/p perforation of anastomosis in the setting of metastatic carcinoma   P:   Zosyn & Diflucan Day #4 Monitor fever curve / WBC   ENDOCRINE A:   Hypothyroidism  DM II  - BG uncontrolled.  P:   Synthroid 65mcg IV daily Moderate dose SSI per algorithm Add lantus 5/31 Hold home  glipizide, actos NS @ 75cc/hr  NEUROLOGIC A:   Acute Encephalopathy - post op small bowel resection  P:   RASS goal: -1 Propofol gtt for sedation  PRN fentanyl for pain    FAMILY  - Updates:  No family at bedside am 5/31.    - Inter-disciplinary family meet or Palliative Care meeting due by:  6/3   Nickolas Madrid, NP 07/22/2015  9:16 AM Pager: [phone] or [phone]  PCCM Attending Note: Patient seen and examined with nurse practitioner. Please refer to her progress note which I reviewed in detail. Patient now status post abdominal closure. Atrial fibrillation has resolved with amiodarone  infusion. Now with hyperglycemia likely secondary to amiodarone infusion. Acute renal failure continuing to improve. Patient's poor nutritional status a significant obstacle to further healing.  I spent a total of 31 minutes of critical care time today caring for the patient and reviewing the patient's electronic medical record.  Sonia Baller Ashok Cordia, M.D. [facility] Pulmonary & Critical Care Pager:  [phone] After 3pm or if no response, call [phone] 11:06 AM 07/22/2015

## 2015-07-22 NOTE — Progress Notes (Signed)
PARENTERAL NUTRITION CONSULT NOTE - INITIAL  Pharmacy Consult for TPN Indication: Prolonged ileus  No Known Allergies  Patient Measurements: Height: 5\' 4"  (162.6 cm) Weight: 195 lb 8.8 oz (88.7 kg) IBW/kg (Calculated) : 54.7 Adjusted Body Weight: 74 Usual Weight: 76kg preop on 5/28  Vital Signs: Temp: 98.4 F (36.9 C) (05/31 0717) Temp Source: Oral (05/31 0717) BP: 121/70 mmHg (05/31 0700) Pulse Rate: 81 (05/31 0700)  Medical History: Past Medical History  Diagnosis Date  . Cancer (HCC)     breast  . Hypertension   . Diabetes mellitus   . Arthritis   . Breast cancer ([place])   . CKD (chronic kidney disease)   . Hypothyroidism 06/22/2015    Insulin Requirements in the past 24 hours:  24 units of SSI 20 units of regular insulin in TPN  Admit:  70yo female admitted 5/28 with abdominal pain s/p SBR for malignant SB obstruction (metastatic breast CA) on 5/19 and d/c to SNF 3 days prior.  She developed severe abdominal pain with CT (+)free air and free fluid.  Pt is now s/p exp lap for perforated viscus with peritonitis- resection of anastamosis and placement of wound VAC. She is to return to OR 5/30.  Assessment:  Pt with mostly poor PO intake over 5/19 admission and expected same with development of abdominal discomfort and pain during her 3 days at SNF, as well as expectation for prolonged ileus.  Pt is to start TPN.  Surgeries/Procedures: 5/19  Small bowel resection for malignant SBO (primary: breast CA) 5/28  Emergent exp lap with resection of anastamosis, wound VAC  GI:  Wound VAC in place, abd soft, hypoactive BS. Albumin is low at 1.2, prealbumin 2.5. Returned to OR on 5/30 for creation of small bowel anastomosis and abdominal wall closure. NG output was down to 484ml yesterday, wound VAC down to 84ml in last 24 hrs. PPI-IV Endo:  Hx DM, CBGs uncontrolled yesterday, trended up to 190-260s. Hypothyroid.  Levothyroxine IV. Lytes: wnl exc K low at 3.7 (goal > 4) Mg 1.9  (goal > 2) and Phos ok. CoCa 8.9 but given Ca replacement yesterday Renal:  Acute on chronic kidney disease.  SCr stable at 1.72, CrCl ~74ml/min. NS at 91ml/hr Pulm:  Intubated Cards:  Hx HTN.  AFib with RVR.  Neo gtt off Hepatobil:  LFTs elevated but trending down. TBili wnl.  TG up to 175 Neuro:  Sedated.  Propofol 9.20ml/hr (77mcg/kg/min), Fent PRN ID:  Day #3 of Zosyn for peritonitis.  Afebrile, WBC up to 13.6.  Best Practices:  Heparin SQ, mouthcare TPN Access:  CVL 5/28 TPN start date:  5/29 >>  Current Nutrition:  NPO Clinimix E 5/15 at 38ml/hr Propofol will provide ~240 kcal per day, at current rate.  Nutritional Goals: per RD recs on 5/29 KCal: 1602 Protein: 105-120 g  Plan:  Continue Clinimix E 5/15 at 71ml/hr Hold 20% lipid emulsion for first 7 days for ICU patients per ASPEN guidelines (Start date 6/5)  Continue propofol per MD Continue NS at [place] and propofol provides 78 g of protein and ~1340 kCals per day meeting 70% of protein and 84% of kCal needs Continue MVI and TE in TPN Increase to 40 units of regular insulin in TPN Continue moderate SSI and adjust as needed Will need to control CBGs better before increasing TPN further Monitor TPN labs tomorrow F/U improvement in ileus, advancement of diet  Give Magnesium 2g IV x 1 Give KCl 62mEq x 3 runs  Elenor Quinones, PharmD, BCPS Clinical Pharmacist Pager [phone] 07/22/2015 7:37 AM

## 2015-07-22 NOTE — Clinical Documentation Improvement (Signed)
[facility],  Please further specify the type of "Acute Encephalopathy - post op small bowel resection" and document findings in next progress note. Thank you!    Metabolic Encephalopathy secondary to peritonitis, electrolyte imbalances  Toxic Encephalopathy  Other Type  Clinically Undetermined  Supporting Information:  Please exercise your independent, professional judgment when responding. A specific answer is not anticipated or expected.  Thank You, Zoila Shutter RN, BSN, [place] [phone]; Cell: [phone]

## 2015-07-22 NOTE — Care Management Important Message (Signed)
Important Message  Patient Details  Name: Monica Morrison MRN: [id number] Date of Birth: 10-18-1945   Medicare Important Message Given:  Yes    Loann Quill 07/22/2015, 9:57 AM

## 2015-07-22 NOTE — Clinical Documentation Improvement (Signed)
Critical Care  Can the diagnosis of Atrial Fibrillation be further specified? Please document response in next progress note. Thank you!   Chronic Atrial fibrillation  Paroxysmal Atrial fibrillation  Post Op AF  Permanent Atrial fibrillation  Persistent Atrial fibrillation  Other  Clinically Undetermined  Document any associated diagnoses/conditions.  Supporting Information:  Being treated with Amiodarone infusion  Please exercise your independent, professional judgment when responding. A specific answer is not anticipated or expected.  Thank You,  Zoila Shutter RN, BSN, [place] [phone]; Cell: [phone]

## 2015-07-23 ENCOUNTER — Inpatient Hospital Stay (HOSPITAL_COMMUNITY): Payer: Commercial Managed Care - HMO

## 2015-07-23 ENCOUNTER — Encounter (HOSPITAL_COMMUNITY): Payer: Self-pay | Admitting: General Surgery

## 2015-07-23 DIAGNOSIS — R410 Disorientation, unspecified: Secondary | ICD-10-CM

## 2015-07-23 LAB — GLUCOSE, CAPILLARY
GLUCOSE-CAPILLARY: 147 mg/dL — AB (ref 65–99)
GLUCOSE-CAPILLARY: 153 mg/dL — AB (ref 65–99)
GLUCOSE-CAPILLARY: 174 mg/dL — AB (ref 65–99)
GLUCOSE-CAPILLARY: 179 mg/dL — AB (ref 65–99)
Glucose-Capillary: 122 mg/dL — ABNORMAL HIGH (ref 65–99)
Glucose-Capillary: 137 mg/dL — ABNORMAL HIGH (ref 65–99)

## 2015-07-23 LAB — CBC
HCT: 27.1 % — ABNORMAL LOW (ref 36.0–46.0)
HEMOGLOBIN: 8.4 g/dL — AB (ref 12.0–15.0)
MCH: 24.9 pg — AB (ref 26.0–34.0)
MCHC: 31 g/dL (ref 30.0–36.0)
MCV: 80.4 fL (ref 78.0–100.0)
Platelets: 238 10*3/uL (ref 150–400)
RBC: 3.37 MIL/uL — AB (ref 3.87–5.11)
RDW: 18.5 % — ABNORMAL HIGH (ref 11.5–15.5)
WBC: 13.4 10*3/uL — ABNORMAL HIGH (ref 4.0–10.5)

## 2015-07-23 LAB — COMPREHENSIVE METABOLIC PANEL
ALBUMIN: 1.1 g/dL — AB (ref 3.5–5.0)
ALT: 48 U/L (ref 14–54)
ANION GAP: 9 (ref 5–15)
AST: 25 U/L (ref 15–41)
Alkaline Phosphatase: 216 U/L — ABNORMAL HIGH (ref 38–126)
BUN: 31 mg/dL — ABNORMAL HIGH (ref 6–20)
CHLORIDE: 109 mmol/L (ref 101–111)
CO2: 19 mmol/L — AB (ref 22–32)
Calcium: 7.2 mg/dL — ABNORMAL LOW (ref 8.9–10.3)
Creatinine, Ser: 1.66 mg/dL — ABNORMAL HIGH (ref 0.44–1.00)
GFR calc Af Amer: 35 mL/min — ABNORMAL LOW (ref >60)
GFR calc non Af Amer: 30 mL/min — ABNORMAL LOW (ref >60)
GLUCOSE: 166 mg/dL — AB (ref 65–99)
POTASSIUM: 3.3 mmol/L — AB (ref 3.5–5.1)
SODIUM: 137 mmol/L (ref 135–145)
TOTAL PROTEIN: 4.5 g/dL — AB (ref 6.5–8.1)
Total Bilirubin: <0.1 mg/dL — ABNORMAL LOW (ref 0.3–1.2)

## 2015-07-23 LAB — PHOSPHORUS: Phosphorus: 4 mg/dL (ref 2.5–4.6)

## 2015-07-23 LAB — TRIGLYCERIDES: TRIGLYCERIDES: 138 mg/dL (ref <150)

## 2015-07-23 LAB — MAGNESIUM: MAGNESIUM: 2.3 mg/dL (ref 1.7–2.4)

## 2015-07-23 MED ORDER — TRACE MINERALS CR-CU-MN-SE-ZN 10-1000-500-60 MCG/ML IV SOLN
INTRAVENOUS | Status: AC
  Administered 2015-07-23: 17:00:00 via INTRAVENOUS
  Filled 2015-07-23: qty 1992

## 2015-07-23 MED ORDER — POTASSIUM CHLORIDE 10 MEQ/50ML IV SOLN
10.0000 meq | INTRAVENOUS | Status: AC
  Administered 2015-07-23 (×4): 10 meq via INTRAVENOUS
  Filled 2015-07-23: qty 50

## 2015-07-23 MED ORDER — CHLORHEXIDINE GLUCONATE 0.12 % MT SOLN
OROMUCOSAL | Status: AC
  Administered 2015-07-23: 15 mL
  Filled 2015-07-23: qty 15

## 2015-07-23 NOTE — Progress Notes (Signed)
PARENTERAL NUTRITION CONSULT NOTE - Follow Up  Pharmacy Consult for TPN Indication: Prolonged ileus  No Known Allergies  Patient Measurements: Height: 5\' 5"  (165.1 cm) Weight: 196 lb 10.4 oz (89.2 kg) IBW/kg (Calculated) : 57 Adjusted Body Weight: 74 Usual Weight: 76kg preop on 5/28  Vital Signs: Temp: 98.9 F (37.2 C) (06/01 0358) Temp Source: Oral (06/01 0358) BP: 116/69 mmHg (06/01 0700) Pulse Rate: 73 (06/01 0700)  Medical History: Past Medical History  Diagnosis Date  . Cancer (HCC)     breast  . Hypertension   . Diabetes mellitus   . Arthritis   . Breast cancer ([place])   . CKD (chronic kidney disease)   . Hypothyroidism 06/22/2015    Insulin Requirements in the past 24 hours:  30 units of SSI 20 units of regular insulin in TPN\ 10 units of daily Lantus   Admit:  70yo female admitted 5/28 with abdominal pain s/p SBR for malignant SB obstruction (metastatic breast CA) on 5/19 and d/c to SNF 3 days prior.  She developed severe abdominal pain with CT (+)free air and free fluid.    Assessment:  Pt with mostly poor PO intake over 5/19 admission and expected same with development of abdominal discomfort and pain during her 3 days at SNF, as well as expectation for prolonged ileus.  Pt is on TPN  Surgeries/Procedures: 5/19  Small bowel resection for malignant SBO (primary: breast CA) 5/28  Emergent exp lap with resection of anastamosis, wound VAC 5/30 Reexploration with SB anastomosis and abdominal wall closure   GI:  Now s/p closure of abdominal wall with removal of wound VAC on 5/30, abd soft, hypoactive BS. Albumin is low at 1.1, prealbumin 2.5. NG output was down to 469ml yesterday. PPI IV (spoke with surgery who would prefer to keep on PPI)  Endo:  Hx DM, CBGs have improved since increasing insulin in TPN, now down to 153, MD added Lantus yesterday, Hypothyroid.  Levothyroxine IV. Lytes: K low at 3.3 (goal > 4) Mg 2.3 (goal > 2) and Phos 4. CoCa up to 9.52 Renal:   Acute on chronic kidney disease.  SCr stable at 1.66, CrCl ~38ml/min. NS at 61ml/hr Pulm:  Intubated Cards:  Hx HTN.  AFib with RVR.  Neo gtt off, on amiodarone infusion, CVP 20  Hepatobil:  LFTs elevated but trending down. TBili wnl.  TG down to 138 Neuro:  Sedated.  Propofol @ 60mcg/kg/min, Fent PRN, RASS -1  ID:  Day #4 of Zosyn for peritonitis.  Afebrile, WBC 13.4, afebrile   Best Practices:  Heparin SQ, mouthcare TPN Access:  CVL 5/28 TPN start date:  5/29 >>  Current Nutrition:  NPO Clinimix E 5/15 at 71ml/hr Propofol will provide ~140 kcal per day, at current rate.  Nutritional Goals: per RD recs on 5/29 KCal: 1602 Protein: 105-120 g  Plan:  Increase Clinimix E 5/15 to 83 ml/hr Hold 20% lipid emulsion for first 7 days for ICU patients per ASPEN guidelines (Start date 6/5)  Continue propofol per MD Continue NS at [place] and propofol provides 100 g of protein and ~1554 kCals per day meeting 95% of protein and 88% of kCal needs Continue MVI and TE in TPN Increase to 45 units of regular insulin in TPN Continue moderate SSI and adjust as needed Monitor TPN labs tomorrow F/U improvement in ileus, advancement of diet   Give KCl 77mEq x 4 runs  Albertina Parr, PharmD., BCPS Clinical Pharmacist Pager [phone]

## 2015-07-23 NOTE — Progress Notes (Signed)
[facility] Progress Note  2 Days Post-Op  Subjective: Intubated, they are attempting to wean, following some commands.  No N/V, NG output 127mL/24hr.  Last BM 5/31.  Dressing changes okay.    Objective: Vital signs in last 24 hours: Temp:  [98.2 F (36.8 C)-98.9 F (37.2 C)] 98.2 F (36.8 C) (06/01 0828) Pulse Rate:  [73-105] 83 (06/01 0900) Resp:  [14-29] 16 (06/01 0900) BP: (98-148)/(59-90) 121/73 mmHg (06/01 0900) SpO2:  [99 %-100 %] 100 % (06/01 0900) Arterial Line BP: (86-169)/(48-80) 150/67 mmHg (06/01 0900) FiO2 (%):  [40 %] 40 % (06/01 0805) Weight:  [196 lb 10.4 oz (89.2 kg)] 196 lb 10.4 oz (89.2 kg) (06/01 0500) Last BM Date: 07/22/15  Intake/Output from previous day: 05/31 0701 - 06/01 0700 In: 2846 [I.V.:896; NG/GT:90; IV Piggyback:300; TPN:1560] Out: 1800 [Urine:1350; Emesis/NG output:450] Intake/Output this shift: Total I/O In: 192.4 [I.V.:62.4; TPN:130] Out: 275 [Urine:175; Emesis/NG output:100]  PE: Gen:  Intubated, not sedated, drowsy, opens eyes to voice Card:  RRR, no M/G/R heard Pulm:  On vent, course breath sounds Abd: Soft, NT/ND, +BS, no HSM, midline wound pale but clean with some serous drainage Ext:  Edematous to all 4 ext  Lab Results:   Recent Labs  07/21/15 0440 07/23/15 0440  WBC 13.6* 13.4*  HGB 8.3* 8.4*  HCT 27.1* 27.1*  PLT 246 238   BMET  Recent Labs  07/22/15 0353 07/23/15 0440  NA 135 137  K 3.7 3.3*  CL 109 109  CO2 18* 19*  GLUCOSE 289* 166*  BUN 25* 31*  CREATININE 1.72* 1.66*  CALCIUM 6.8* 7.2*   PT/INR No results for input(s): LABPROT, INR in the last 72 hours. CMP     Component Value Date/Time   NA 137 07/23/2015 0440   NA 141 02/09/2015 1120   K 3.3* 07/23/2015 0440   K 4.5 02/09/2015 1120   CL 109 07/23/2015 0440   CL 97* 03/12/2012 1310   CO2 19* 07/23/2015 0440   CO2 27 02/09/2015 1120   GLUCOSE 166* 07/23/2015 0440   GLUCOSE 127 02/09/2015 1120   GLUCOSE 131* 03/12/2012 1310   BUN 31* 07/23/2015 0440   BUN 30.1* 02/09/2015 1120   CREATININE 1.66* 07/23/2015 0440   CREATININE 1.4* 02/09/2015 1120   CALCIUM 7.2* 07/23/2015 0440   CALCIUM 10.3 02/09/2015 1120   PROT 4.5* 07/23/2015 0440   PROT 7.4 02/09/2015 1120   ALBUMIN 1.1* 07/23/2015 0440   ALBUMIN 3.5 02/09/2015 1120   AST 25 07/23/2015 0440   AST 17 02/09/2015 1120   ALT 48 07/23/2015 0440   ALT 13 02/09/2015 1120   ALKPHOS 216* 07/23/2015 0440   ALKPHOS 74 02/09/2015 1120   BILITOT <0.1* 07/23/2015 0440   BILITOT 0.35 02/09/2015 1120   GFRNONAA 30* 07/23/2015 0440   GFRAA 35* 07/23/2015 0440   Lipase     Component Value Date/Time   LIPASE 30 07/10/2015 0018       Studies/Results: Dg Chest Portable 1 View  07/23/2015  CLINICAL DATA:  Abdominal wound. EXAM: PORTABLE CHEST 1 VIEW COMPARISON:  07/21/2015. FINDINGS: Endotracheal tube, NG tube, right PICC line, right IJ line in stable position. Mediastinum and hilar structures are normal. Heart size normal. Low lung volumes with mild bibasilar atelectasis. No pleural effusion or pneumothorax. Surgical clips left chest. IMPRESSION: 1. Lines and tubes in stable position. 2. Low lung volumes with mild bibasilar atelectasis. Electronically Signed   By: Marcello Moores  Register   On: 07/23/2015 07:57  Anti-infectives: Anti-infectives    Start     Dose/Rate Route Frequency Ordered Stop   07/19/15 1000  piperacillin-tazobactam (ZOSYN) IVPB 3.375 g     3.375 g 12.5 mL/hr over 240 Minutes Intravenous Every 8 hours 07/19/15 0828     07/19/15 0608  dextrose 5 % with cefOXitin (MEFOXIN) ADS Med    Comments:  [name]   : cabinet override      07/19/15 [id number] 07/19/15 0629       Assessment/Plan SBO due to mass from metastatic breast cancer -Path Shows:  Metastatic Lobular Breast Carcinoma H/o exploratory laparotomy, SBR--Dr. [place] Silos 07/10/15 Breakdown of previous small bowel anastomosis and peritonitis  POD#4 Exploratory laparotomy with resection of  anastomosis and placement of wound vac--Dr. Brantley Stage 07/19/15 POD#2 Re-exploration of abdomen, creation of small bowel anastomosis, abdominal wall closure--Dr. Redmond Pulling 07/21/15 -Continue BID wet to dry dressing changes, maybe wound vac at some point -IVF, pain control, antiemetics -NPO, TPN, no tube feeds yet -Await bowel function ID-Zosyn D#5/10 peritonitis, WBC 13.4, monitor Severe PCM-prealbumin 2.5,PICC, TPN Acute respiratory failure-appreciate CCM management. Wean to extubate per CCM Acute renal failure-monitor UOP, improved  Metastatic breast cancer/carcinomatosis AF with RVR-NSR on amio VTE prophylaxis-SCD/heparin Dispo-ICU    LOS: 4 days    Nat Christen 07/23/2015, 10:36 AM Pager: [phone]  (7am - 4:30pm M-F; 7am - 11:30am Sa/Su)

## 2015-07-23 NOTE — Progress Notes (Addendum)
Initial Nutrition Assessment  DOCUMENTATION CODES:   Not applicable  INTERVENTION:    TPN per pharmacy  NUTRITION DIAGNOSIS:   Inadequate oral intake related to inability to eat as evidenced by NPO status, ongoing  GOAL:   Patient will meet greater than or equal to 90% of their needs, met  MONITOR:   Vent status, Labs, Weight trends, Skin, I & O's  ASSESSMENT:   70 yo female admitted 5/28 with abdominal pain s/p SBR for malignant SB obstruction (metastatic breast CA) on 5/19 and d/c to SNF 3 days prior. She developed severe abdominal pain with CT (+)free air and free fluid. Pt is now s/p exp lap for perforated viscus with peritonitis- resection of anastamosis and placement of wound VAC.    Patient is currently intubated on ventilator >> NGT to LIS MV: 14.0 L/min Temp (24hrs), Avg:98.6 F (37 C), Min:98.2 F (36.8 C), Max:98.9 F (37.2 C)   Patient s/p procedures 5/30: REEXPLORATION ABDOMEN CREATION OF SMALL BOWEL ANASTOMOSIS ABDOMINAL WALL CLOSURE  Patient is receiving TPN with Clinimix E 5/15 @ 83 ml/hr via CVC. No 20% ILE at this time.   Provides 1414 kcal and 100 grams protein per day.  Meets 85% minimum estimated energy needs and 83% minimum estimated protein needs.  Diet Order:  Diet NPO time specified .TPN (CLINIMIX-E) Adult .TPN (CLINIMIX-E) Adult  Skin:  Wound (see comment) (abdominal surgical incision with VAC)  Last BM:  6/1  Height:   Ht Readings from Last 1 Encounters:  07/22/15 _0  (1.651 m)    Weight:   Wt Readings from Last 1 Encounters:  07/23/15 196 lb 10.4 oz (89.2 kg)  Admission weight: 167 lb (75.751 kg); BMI=28.7  Ideal Body Weight:  54.5 kg  BMI:  Body mass index is 32.72 kg/(m^2).; 28.7 using admission weight  Estimated Nutritional Needs:   Kcal:  1668  Protein:  120-130 gm  Fluid:  per MD  EDUCATION NEEDS:   No education needs identified at this time  Arthur Holms, RD, LDN Pager #: [phone] [place]  Pager #: [phone]

## 2015-07-23 NOTE — Progress Notes (Signed)
PULMONARY / CRITICAL CARE MEDICINE   Name: Monica Morrison MRN: [id number] DOB: 19-Oct-1945    ADMISSION DATE:  07/19/2015 CONSULTATION DATE:  07/19/15  REFERRING MD:  Dr. Brantley Stage   CHIEF COMPLAINT:  Malignant Small Bowel Obstruction  HISTORY OF PRESENT ILLNESS:  [name] with PMH of HTN, DM II, CKD, HTN, arthritis, hypothyroidism, breast cancer and recent admission from 5/19-5/24 for a small bowel obstruction that was found to be metastatic carcinoma s/p exploratory lap / small bowel resection who presented to [facility] on 5/28 with complaints of severe abdominal pain.    The patient was seen in the ER at [facility] for evaluation of abdominal pain.  Initial CT of the abdomen showed free air, a large quantity of free fluid and oral contrast.  Labs - Na 140, K 3.3, Hgb 13.3 / Hct 39, ionized calcium 1.0.  She was transferred to [facility] for operative evaluation. The patient was taken urgently to the OR for exploratory laparotomy with small bowel anastomosis resection.  She returned to the ICU post-operatively on mechanical ventilation.  Post-operatively, she was noted to have runs of SVT & a right mainstem intubation.  Repeat labs pending.    SUBJECTIVE: On wean protocol   REVIEW OF SYSTEMS: Unable to obtain given intubation and sedation.  VITAL SIGNS: BP 110/68 mmHg  Pulse 79  Temp(Src) 98.2 F (36.8 C) (Axillary)  Resp 20  Ht 5\' 5"  (1.651 m)  Wt 196 lb 10.4 oz (89.2 kg)  BMI 32.72 kg/m2  SpO2 100%  HEMODYNAMICS: CVP:  [13 mmHg-23 mmHg] 20 mmHg  VENTILATOR SETTINGS: Vent Mode:  [-] PRVC FiO2 (%):  [40 %] 40 % Set Rate:  [20 bmp] 20 bmp Vt Set:  [430 mL-460 mL] 460 mL PEEP:  [5 cmH20] 5 cmH20 Pressure Support:  [8 cmH20-10 cmH20] 10 cmH20 Plateau Pressure:  [15 cmH20-22 cmH20] 17 cmH20  INTAKE / OUTPUT: I/O last 3 completed shifts: In: 3613.1 [I.V.:1091.2; NG/GT:120; IV Piggyback:350] Out: [id number] [Urine:1835; Emesis/NG output:700]  PHYSICAL EXAMINATION: General:  Sedated. No  distress.  Neuro:  Sedated. RASS -1, Pupils equal. Follow some commands HEENT:  ETT in place. No scleral icterus. Cardiovascular:  s1s2hsr, sr telemetry. No edema.  Lungs:  resps even non labored on vent, Clear to auscultation bilaterally. Symmetric chest rise on ventilator. Abdomen:  Soft. Midline dressing c/d, Hypoactive BS. Integument:  Warm & dry. No rash on exposed skin.  LABS:  BMET  Recent Labs Lab 07/21/15 0440 07/22/15 0353 07/23/15 0440  NA 139 135 137  K 3.6 3.7 3.3*  CL 113* 109 109  CO2 18* 18* 19*  BUN 24* 25* 31*  CREATININE 1.84* 1.72* 1.66*  GLUCOSE 195* 289* 166*    Electrolytes  Recent Labs Lab 07/21/15 0440 07/22/15 0353 07/23/15 0440  CALCIUM 6.4* 6.8* 7.2*  MG 2.1 1.9 2.3  PHOS 3.7 4.6 4.0    CBC  Recent Labs Lab 07/20/15 0501 07/21/15 0440 07/23/15 0440  WBC 8.0 13.6* 13.4*  HGB 8.7* 8.3* 8.4*  HCT 28.4* 27.1* 27.1*  PLT 246 246 238    Coag's No results for input(s): APTT, INR in the last 168 hours.  Sepsis Markers  Recent Labs Lab 07/19/15 1847  LATICACIDVEN 1.5    ABG  Recent Labs Lab 07/19/15 0753 07/19/15 1040 07/20/15 0555  PHART 7.152* 7.264* 7.333*  PCO2ART 47.8* 48.2* 36.6  PO2ART 373.0* 472.0* 156.0*    Liver Enzymes  Recent Labs Lab 07/21/15 0440 07/22/15 0353 07/23/15 0440  AST 104*  58* 25  ALT 106* 79* 48  ALKPHOS 148* 253* 216*  BILITOT 0.3 0.3 <0.1*  ALBUMIN 1.2* 1.2* 1.1*    Cardiac Enzymes  Recent Labs Lab 07/19/15 1040 07/19/15 1536 07/19/15 2138  TROPONINI 0.06* 0.06* 0.06*    Glucose  Recent Labs Lab 07/22/15 0713 07/22/15 1202 07/22/15 1542 07/22/15 1935 07/23/15 0007 07/23/15 0357  GLUCAP 259* 262* 243* 196* 179* 153*    Imaging Dg Chest Portable 1 View  07/23/2015  CLINICAL DATA:  Abdominal wound. EXAM: PORTABLE CHEST 1 VIEW COMPARISON:  07/21/2015. FINDINGS: Endotracheal tube, NG tube, right PICC line, right IJ line in stable position. Mediastinum and hilar  structures are normal. Heart size normal. Low lung volumes with mild bibasilar atelectasis. No pleural effusion or pneumothorax. Surgical clips left chest. IMPRESSION: 1. Lines and tubes in stable position. 2. Low lung volumes with mild bibasilar atelectasis. Electronically Signed   By: Marcello Moores  Register   On: 07/23/2015 07:57     STUDIES:  CT ABD/Pelvis 5/28 (OSH): bowel perforation with large volume free air, free fluid, extraluminal enteric contrast in the pelvis, known intra-abdominal metastatic disease, bilateral adrenal masses, liver lesion on prior exam is not as well visualized, cholelithiasis with gallbladder distention.    PORT CXR 5/29:  Endotracheal tube in good position. R IJ CVL in right atrium. NGT coursing below stomach. EKG 5/29:  A fib w/ RVR. PORT CXR 5/30:  R IJ CVL in SVC. ETT in good position.  MICROBIOLOGY: MRSA PCR 5/28:  Negative   ANTIBIOTICS: Zosyn 5/28 >>  Diflucan 5/28 (perf) >> 5/31  SIGNIFICANT EVENTS: 5/28  Re-Admit with abd pain, found to have perforation of anastomosis. Returned to ICU on vent post-op  LINES/TUBES: ETT 5/28 >>  R IJ CVC 5/28 >> 6/1 NGT R 5/28 >> FOLEY 5/28 >> L FEM ART LINE 5/28 >>6/1 R PICC 5/30>>>  ASSESSMENT / PLAN:  PULMONARY A: Acute Respiratory Failure  P:   PRVC 8 cc/kg  Wean PEEP / FiO2 for sats > 94% SBT  Intermittent CXR  WUA  CARDIOVASCULAR A:  A fib w/ RVR Paroxysmal - No h/o A fib. Now SR H/O HTN  P:  Cont amio gtt  Telemetry monitoring Cont hold home lopressor, lipitor   RENAL  Recent Labs Lab 07/21/15 0440 07/22/15 0353 07/23/15 0440  K 3.6 3.7 3.3*    Lab Results  Component Value Date   CREATININE 1.66* 07/23/2015   CREATININE 1.72* 07/22/2015   CREATININE 1.84* 07/21/2015   CREATININE 1.4* 02/09/2015   CREATININE 1.5* 02/06/2014   CREATININE 1.3* 03/12/2012    A:   Acute Renal Failure - Secondary to hypovolemia.  Metabolic Acidosis - Stable. Hypokalemia - Replacing  IV. Hypomagnesemia - Resolved.  P:   Trending electrolytes & renal function daily Trending UOP with Foley   GASTROINTESTINAL A:   Metastatic Carcinoma with Small Bowel Obstruction  Transaminitis P:   Post Op Mgt per CCS NPO TPN PPI  Trending LFTs daily  HEMATOLOGIC/ONCOLOGIC A:   Anemia - No signs of active bleeding. Hx Breast Cancer   P:  Monitor CBC Transfuse per ICU guidelines Heparin [place] q8hr SCDs  INFECTIOUS A:   Peritonitis - s/p perforation of anastomosis in the setting of metastatic carcinoma   P:   Zosyn  Day #5 Monitor fever curve / WBC   ENDOCRINE CBG (last 3)   Recent Labs  07/22/15 1935 07/23/15 0007 07/23/15 0357  GLUCAP 196* 179* 153*     A:   Hypothyroidism  DM II  - BG uncontrolled.  P:   Synthroid 86mcg IV daily Moderate dose SSI per algorithm Lantus 10u Hold home glipizide, actos On TPN @65  cc/hr  NEUROLOGIC A:   Acute Encephalopathy - post op small bowel resection  P:   RASS goal: -1 Propofol gtt off for sedation  PRN fentanyl for pain    FAMILY  - Updates:  No family at bedside am 6/1.    - Inter-disciplinary family meet or Palliative Care meeting due by:  6/3   Discussion: Will dc aline and right I J CVL(has PICC). Working on weaning from vent.   Richardson Landry Minor ACNP Maryanna Shape PCCM Pager [phone] till 3 pm If no answer page [phone] 07/23/2015, 8:58 AM   PCCM Attending Note: Patient seen and examined with nurse practitioner. Please refer to her progress note which I reviewed in detail. Patient now status post abdominal closure. Atrial fibrillation has resolved with amiodarone infusion. Patient's poor nutritional status a significant obstacle to further healing.Patient excessively somnolent today and inhibiting her ability to extubate. Continuing on TPN. Blood glucose with better control.  I spent a total of 32 minutes of critical care time today caring for the patient and reviewing the patient's electronic medical  record.  Sonia Baller Ashok Cordia, M.D. [facility] Pager:  [phone] After 3pm or if no response, call [phone] 12:03 PM 07/23/2015

## 2015-07-24 ENCOUNTER — Inpatient Hospital Stay (HOSPITAL_COMMUNITY): Payer: Commercial Managed Care - HMO

## 2015-07-24 DIAGNOSIS — J9601 Acute respiratory failure with hypoxia: Secondary | ICD-10-CM

## 2015-07-24 LAB — COMPREHENSIVE METABOLIC PANEL
ALBUMIN: 1.1 g/dL — AB (ref 3.5–5.0)
ALT: 35 U/L (ref 14–54)
AST: 21 U/L (ref 15–41)
Alkaline Phosphatase: 198 U/L — ABNORMAL HIGH (ref 38–126)
Anion gap: 5 (ref 5–15)
BUN: 37 mg/dL — AB (ref 6–20)
CALCIUM: 7.6 mg/dL — AB (ref 8.9–10.3)
CHLORIDE: 110 mmol/L (ref 101–111)
CO2: 22 mmol/L (ref 22–32)
Creatinine, Ser: 1.67 mg/dL — ABNORMAL HIGH (ref 0.44–1.00)
GFR calc Af Amer: 35 mL/min — ABNORMAL LOW (ref >60)
GFR calc non Af Amer: 30 mL/min — ABNORMAL LOW (ref >60)
GLUCOSE: 179 mg/dL — AB (ref 65–99)
POTASSIUM: 3.6 mmol/L (ref 3.5–5.1)
SODIUM: 137 mmol/L (ref 135–145)
TOTAL PROTEIN: 4.6 g/dL — AB (ref 6.5–8.1)
Total Bilirubin: 0.3 mg/dL (ref 0.3–1.2)

## 2015-07-24 LAB — CBC
HCT: 26.7 % — ABNORMAL LOW (ref 36.0–46.0)
Hemoglobin: 8.4 g/dL — ABNORMAL LOW (ref 12.0–15.0)
MCH: 25 pg — ABNORMAL LOW (ref 26.0–34.0)
MCHC: 31.5 g/dL (ref 30.0–36.0)
MCV: 79.5 fL (ref 78.0–100.0)
PLATELETS: 257 10*3/uL (ref 150–400)
RBC: 3.36 MIL/uL — AB (ref 3.87–5.11)
RDW: 18.3 % — ABNORMAL HIGH (ref 11.5–15.5)
WBC: 12.2 10*3/uL — AB (ref 4.0–10.5)

## 2015-07-24 LAB — GLUCOSE, CAPILLARY
GLUCOSE-CAPILLARY: 168 mg/dL — AB (ref 65–99)
GLUCOSE-CAPILLARY: 173 mg/dL — AB (ref 65–99)
Glucose-Capillary: 177 mg/dL — ABNORMAL HIGH (ref 65–99)
Glucose-Capillary: 178 mg/dL — ABNORMAL HIGH (ref 65–99)
Glucose-Capillary: 163 mg/dL — ABNORMAL HIGH (ref 65–99)

## 2015-07-24 LAB — MAGNESIUM: MAGNESIUM: 2.1 mg/dL (ref 1.7–2.4)

## 2015-07-24 LAB — PHOSPHORUS: PHOSPHORUS: 4.7 mg/dL — AB (ref 2.5–4.6)

## 2015-07-24 LAB — TRIGLYCERIDES: TRIGLYCERIDES: 116 mg/dL (ref <150)

## 2015-07-24 MED ORDER — FUROSEMIDE 10 MG/ML IJ SOLN
20.0000 mg | Freq: Once | INTRAMUSCULAR | Status: AC
  Administered 2015-07-24: 20 mg via INTRAVENOUS
  Filled 2015-07-24: qty 2

## 2015-07-24 MED ORDER — INSULIN GLARGINE 100 UNIT/ML ~~LOC~~ SOLN
17.0000 [IU] | Freq: Every day | SUBCUTANEOUS | Status: DC
  Administered 2015-07-24 – 2015-07-27 (×4): 17 [IU] via SUBCUTANEOUS
  Filled 2015-07-24 (×4): qty 0.17

## 2015-07-24 MED ORDER — TRACE MINERALS CR-CU-MN-SE-ZN 10-1000-500-60 MCG/ML IV SOLN
INTRAVENOUS | Status: AC
  Administered 2015-07-24: 17:00:00 via INTRAVENOUS
  Filled 2015-07-24: qty 2400

## 2015-07-24 MED ORDER — POTASSIUM CHLORIDE 10 MEQ/50ML IV SOLN
10.0000 meq | INTRAVENOUS | Status: AC
  Administered 2015-07-24 (×4): 10 meq via INTRAVENOUS
  Filled 2015-07-24 (×4): qty 50

## 2015-07-24 NOTE — Progress Notes (Addendum)
PULMONARY / CRITICAL CARE MEDICINE   Name: Monica Morrison MRN: [id number] DOB: 1945-07-03    ADMISSION DATE:  07/19/2015 CONSULTATION DATE:  07/19/15  REFERRING MD:  Dr. Brantley Stage   CHIEF COMPLAINT:  Malignant Small Bowel Obstruction  HISTORY OF PRESENT ILLNESS:  [name] with PMH of HTN, DM II, CKD, HTN, arthritis, hypothyroidism, breast cancer and recent admission from 5/19-5/24 for a small bowel obstruction that was found to be metastatic carcinoma s/p exploratory lap / small bowel resection who presented to [facility] on 5/28 with complaints of severe abdominal pain.    The patient was seen in the ER at [facility] for evaluation of abdominal pain.  Initial CT of the abdomen showed free air, a large quantity of free fluid and oral contrast.  Labs - Na 140, K 3.3, Hgb 13.3 / Hct 39, ionized calcium 1.0.  She was transferred to [facility] for operative evaluation. The patient was taken urgently to the OR for exploratory laparotomy with small bowel anastomosis resection.  She returned to the ICU post-operatively on mechanical ventilation.  Post-operatively, she was noted to have runs of SVT & a right mainstem intubation.  Repeat labs pending.    SUBJECTIVE: No acute events overnight. Patient had Fentanyl last at 00:30 this AM. Remains off Propofol gtt. Nods not to any pain or difficulty breathing.  REVIEW OF SYSTEMS: Unable to obtain given intubation.  VITAL SIGNS: BP 137/70 mmHg  Pulse 92  Temp(Src) 98.7 F (37.1 C) (Oral)  Resp 18  Ht 5\' 5"  (1.651 m)  Wt 197 lb 1.5 oz (89.4 kg)  BMI 32.80 kg/m2  SpO2 100%  HEMODYNAMICS: CVP:  [16 mmHg-21 mmHg] 16 mmHg  VENTILATOR SETTINGS: Vent Mode:  [-] PSV FiO2 (%):  [40 %] 40 % Set Rate:  [20 bmp] 20 bmp Vt Set:  [460 mL] 460 mL PEEP:  [5 cmH20] 5 cmH20 Pressure Support:  [10 cmH20] 10 cmH20 Plateau Pressure:  [14 cmH20] 14 cmH20  INTAKE / OUTPUT: I/O last 3 completed shifts: In: 4445.8 [I.V.:1295.9; NG/GT:210; IV Piggyback:350] Out: [id number]  [Urine:2370; Emesis/NG output:900]  PHYSICAL EXAMINATION: General:  Eyes closed. No distress. RN at bedside. Neuro:  Following commands. Moving all 4 extremities. Nods to questions. HEENT:  ETT in place. No scleral icterus or injection. Cardiovascular:  No edema. Sinus rhythm on telemetry. Regular rate. Lungs:  Clear on auscultation bilaterally. Symmetric chest rise on ventilator. Normal effort on PS 0/5. Abdomen:  Soft. Hypoactive bowel sounds. Integument:  Warm & dry. No rash on exposed skin. Abdominal dressing clean & dry.   LABS:  BMET  Recent Labs Lab 07/22/15 0353 07/23/15 0440 07/24/15 0510  NA 135 137 137  K 3.7 3.3* 3.6  CL 109 109 110  CO2 18* 19* 22  BUN 25* 31* 37*  CREATININE 1.72* 1.66* 1.67*  GLUCOSE 289* 166* 179*    Electrolytes  Recent Labs Lab 07/22/15 0353 07/23/15 0440 07/24/15 0510  CALCIUM 6.8* 7.2* 7.6*  MG 1.9 2.3 2.1  PHOS 4.6 4.0 4.7*    CBC  Recent Labs Lab 07/21/15 0440 07/23/15 0440 07/24/15 0510  WBC 13.6* 13.4* 12.2*  HGB 8.3* 8.4* 8.4*  HCT 27.1* 27.1* 26.7*  PLT 246 238 257    Coag's No results for input(s): APTT, INR in the last 168 hours.  Sepsis Markers  Recent Labs Lab 07/19/15 1847  LATICACIDVEN 1.5    ABG  Recent Labs Lab 07/19/15 0753 07/19/15 1040 07/20/15 0555  PHART 7.152* 7.264* 7.333*  PCO2ART  47.8* 48.2* 36.6  PO2ART 373.0* 472.0* 156.0*    Liver Enzymes  Recent Labs Lab 07/22/15 0353 07/23/15 0440 07/24/15 0510  AST 58* 25 21  ALT 79* 48 35  ALKPHOS 253* 216* 198*  BILITOT 0.3 <0.1* 0.3  ALBUMIN 1.2* 1.1* 1.1*    Cardiac Enzymes  Recent Labs Lab 07/19/15 1040 07/19/15 1536 07/19/15 2138  TROPONINI 0.06* 0.06* 0.06*    Glucose  Recent Labs Lab 07/23/15 1230 07/23/15 1552 07/23/15 2027 07/24/15 0001 07/24/15 0452 07/24/15 0854  GLUCAP 122* 137* 147* 173* 168* 177*    Imaging Dg Chest Port 1 View  07/24/2015  CLINICAL DATA:  Respiratory failure EXAM: PORTABLE  CHEST 1 VIEW COMPARISON:  07/23/2015 FINDINGS: Support devices in unchanged position including right PICC line with tip well into the right atrium. Heart size normal. Vascular pattern normal. Low lung volumes with stable mild bilateral lower lobe atelectasis. IMPRESSION: No change from prior study with mild atelectasis bilaterally. Unchanged position of lines and tubes as described above including right PICC line with tip deep into right atrium Electronically Signed   By: Skipper Cliche M.D.   On: 07/24/2015 07:24     STUDIES:  CT ABD/Pelvis 5/28 (OSH): bowel perforation with large volume free air, free fluid, extraluminal enteric contrast in the pelvis, known intra-abdominal metastatic disease, bilateral adrenal masses, liver lesion on prior exam is not as well visualized, cholelithiasis with gallbladder distention.    PORT CXR 5/29:  Endotracheal tube in good position. R IJ CVL in right atrium. NGT coursing below stomach. EKG 5/29:  A fib w/ RVR. PORT CXR 5/30:  R IJ CVL in SVC. ETT in good position. PORT CXR 6/2:  R PICC w/ tip in atrium. ETT in good position. Low lung volumes with minimal atelectasis.  MICROBIOLOGY: MRSA PCR 5/28:  Negative   ANTIBIOTICS: Diflucan 5/28 (perf) - 5/31 Zosyn 5/28 >>   SIGNIFICANT EVENTS: 5/28  Re-Admit with abd pain, found to have perforation of anastomosis. Returned to ICU on vent post-op  LINES/TUBES: L FEM ART LINE 5/28 - 6/1 R IJ CVC 5/28 - 6/1 ETT 5/28 >>  NGT R NARE 5/28 >> FOLEY 5/28 >> R PICC 5/30>>>  ASSESSMENT / PLAN:  PULMONARY A: Acute Respiratory Failure No Cuff Leak  P:   Continue Full Vent Support SBT in AM 6/3 Intermittent Lasix for diuresis to help with cuff leak  CARDIOVASCULAR A:  A fib w/ RVR Paroxysmal - No h/o A fib. Now SR H/O HTN  P:  Continuing amio gtt  Telemetry monitoring Cont hold home lopressor, lipitor   RENAL A:   Acute Renal Failure - Stable creatinine. Metabolic Acidosis - Stable. Hypokalemia  - Resolved. Hypomagnesemia - Resolved.  P:   Trending electrolytes & renal function daily Trending UOP with Foley  GASTROINTESTINAL A:   Metastatic Carcinoma with Small Bowel Obstruction  Transaminitis - Resolved.  P:   Post Op Mgt per CCS NPO TPN advancing to 100cc/hr Protonix IV q24hr NGT to remain in place post extubation  HEMATOLOGIC/ONCOLOGIC A:   Anemia - No signs of active bleeding. H/O Breast Cancer   P:  Monitor cell counts daily w/ CBC Transfuse per ICU guidelines Heparin [place] q8hr SCDs  INFECTIOUS A:   Peritonitis - s/p perforation of anastomosis in the setting of metastatic carcinoma   P:   Zosyn  Day #6 Trending leukocyte count & monitoring for fever  ENDOCRINE A:   Hypothyroidism  DM II  - BG better controlled but still  elevated.  P:   Synthroid 104mcg IV daily Moderate dose SSI per algorithm Increase Lantus 17u QHS Hold home glipizide, actos TPN rate advancing to 100cc/hr  NEUROLOGIC A:   Acute Encephalopathy - Post op small bowel resection. Resolving.  P:   RASS goal: 0 Propofol gtt off for sedation  PRN fentanyl for pain  Plan to D/C Propofol post extubation PT/OT Consult post extubation  FAMILY  - Updates:  No family at bedside am 6/2.    - Inter-disciplinary family meet or Palliative Care meeting due by:  6/3   TODAY'S SUMMARY:  70 year old female with peritonitis status post resection and abdominal closure. Mental status remains stable. Patient is following commands. Patient has tolerated spontaneous breathing trial on pressure support 0/5 for greater than 30 minutes with stable medical ventilation and hemodynamics. Holding on extubation given absence of cuff leak & gentle diuresis. Increasing Lantus for control of blood glucose. Plan to leave NG tube in place post extubation.  I spent a total of 35 minutes of critical care time today caring for the patient and reviewing the patient's electronic medical record.  Sonia Baller  Ashok Cordia, M.D. [facility] Pulmonary & Critical Care Pager:  [phone] After 3pm or if no response, call [phone] 9:49 AM 07/24/2015

## 2015-07-24 NOTE — Care Management Important Message (Signed)
Important Message  Patient Details  Name: Monica Morrison MRN: [id number] Date of Birth: December 30, 1945   Medicare Important Message Given:  Yes    Loann Quill 07/24/2015, 9:10 AM

## 2015-07-24 NOTE — Progress Notes (Signed)
PARENTERAL NUTRITION CONSULT NOTE - Follow Up  Pharmacy Consult for TPN Indication: Prolonged ileus  No Known Allergies  Patient Measurements: Height: 5\' 5"  (165.1 cm) Weight: 197 lb 1.5 oz (89.4 kg) IBW/kg (Calculated) : 57 Adjusted Body Weight: 74 Usual Weight: 76kg preop on 5/28  Vital Signs: Temp: 99.6 F (37.6 C) (06/02 0400) Temp Source: Oral (06/02 0400) BP: 130/73 mmHg (06/02 0700) Pulse Rate: 79 (06/02 0700)  Medical History: Past Medical History  Diagnosis Date  . Cancer (HCC)     breast  . Hypertension   . Diabetes mellitus   . Arthritis   . Breast cancer ([place])   . CKD (chronic kidney disease)   . Hypothyroidism 06/22/2015    Insulin Requirements in the past 24 hours:  15 units of SSI 45 units of regular insulin in TPN 10 units of daily Lantus   Admit:  70yo female admitted 5/28 with abdominal pain s/p SBR for malignant SB obstruction (metastatic breast CA) on 5/19 and d/c to SNF 3 days prior.  She developed severe abdominal pain with CT (+)free air and free fluid.    Assessment:  Pt with mostly poor PO intake over 5/19 admission and expected same with development of abdominal discomfort and pain during her 3 days at SNF, as well as expectation for prolonged ileus.  Pt is on TPN  Surgeries/Procedures: 5/19  Small bowel resection for malignant SBO (primary: breast CA) 5/28  Emergent exp lap with resection of anastamosis, wound VAC 5/30 Reexploration with SB anastomosis and abdominal wall closure   GI:  Now s/p closure of abdominal wall with removal of wound VAC on 5/30, abd soft, hypoactive BS. Albumin is low at 1.1, prealbumin 2.5. NG output was up to 677ml yesterday. PPI IV (spoke with surgery who would prefer to keep on PPI)  Endo:  Hx DM, CBGs have improved since increasing insulin in TPN, now down to 153, MD added Lantus yesterday, Hypothyroid.  Levothyroxine IV. Lytes: K low at 3.6 after replacement (goal > 4) Mg 2.1 (goal > 2) and Phos 4.7. CoCa up  to 10. Renal:  Acute on chronic kidney disease.  SCr stable at 1.67, CrCl ~68ml/min. UOP ok at 0.70ml/kg/hr. NS at 52ml/hr Pulm:  Intubated, weaning Cards:  Hx HTN.  AFib with RVR.  Neo gtt off, on amiodarone infusion, CVP 20  Hepatobil:  LFTs elevated but trending down. TBili wnl.  TG down to 116 Neuro: Propofol off, Fent PRN, RASS -1 (goal 0) ID:  Day #5 of Zosyn for peritonitis.  Afebrile, WBC 12.2  Best Practices:  Heparin SQ, mouthcare TPN Access:  CVL 5/28 TPN start date:  5/29 >>  Current Nutrition:  NPO Clinimix E 5/15 at 69ml/hr  Nutritional Goals: per RD recs on 6/1 KCal: 1668 Protein: 120-130 g  Plan:  Increase Clinimix E 5/15 to 100 ml/hr Hold 20% lipid emulsion for first 7 days for ICU patients per ASPEN guidelines (Start date 6/5)  Continue NS at [place] provides 120 g of protein and 1704 kCals per day meeting 100% of protein and kCal needs Continue MVI and TE in TPN Increase to 60 units of regular insulin in TPN Continue moderate SSI and adjust as needed Monitor TPN labs, check daily CMet tomorrow F/U improvement in ileus, advancement of diet  Give KCl 26mEq x 4 runs  Elenor Quinones, PharmD, [facility] Clinical Pharmacist Pager [phone] 07/24/2015 7:32 AM

## 2015-07-24 NOTE — Progress Notes (Signed)
ETT negative for cuff leak , Dr. Ashok Cordia aware.

## 2015-07-24 NOTE — Progress Notes (Signed)
3 Days Post-Op  Subjective: She is pretty sedated but can open her eyes some.  Open wound OK changed around 4 AM.  Still some drainage and some white necrotic stuff at the base.     Objective: Vital signs in last 24 hours: Temp:  [98.4 F (36.9 C)-99.6 F (37.6 C)] 99.6 F (37.6 C) (06/02 0400) Pulse Rate:  [79-99] 92 (06/02 0817) Resp:  [12-25] 18 (06/02 0817) BP: (116-142)/(67-84) 137/70 mmHg (06/02 0817) SpO2:  [99 %-100 %] 100 % (06/02 0817) Arterial Line BP: (149-167)/(66-75) 149/66 mmHg (06/01 1100) FiO2 (%):  [40 %] 40 % (06/02 0817) Weight:  [89.4 kg (197 lb 1.5 oz)] 89.4 kg (197 lb 1.5 oz) (06/02 0500) Last BM Date: 07/23/15 2894 IV yesterday  1875 Urine 650 from the NG Afebrile, VSS Creatinine is still stable but up WBC also up but slowly improving CXR this Am:  No change from prior study with mild atelectasis bilaterally. Unchanged position of lines and tubes as described above including right PICC line with tip deep into right atrium Intake/Output from previous day: 06/01 0701 - 06/02 0700 In: 2894.8 [I.V.:664.9; NG/GT:120; IV Piggyback:300; TPN:1809.9] Out: 2525 [Urine:1875; Emesis/NG output:650] Intake/Output this shift:    General appearance: sedated on the Vent.   Resp: On full Vent support GI: No BS, open wound OK, some drainage on the dressings, and white fibrous stuff at the bases.    Lab Results:   Recent Labs  07/23/15 0440 07/24/15 0510  WBC 13.4* 12.2*  HGB 8.4* 8.4*  HCT 27.1* 26.7*  PLT 238 257    BMET  Recent Labs  07/23/15 0440 07/24/15 0510  NA 137 137  K 3.3* 3.6  CL 109 110  CO2 19* 22  GLUCOSE 166* 179*  BUN 31* 37*  CREATININE 1.66* 1.67*  CALCIUM 7.2* 7.6*   PT/INR No results for input(s): LABPROT, INR in the last 72 hours.   Recent Labs Lab 07/20/15 0501 07/21/15 0440 07/22/15 0353 07/23/15 0440 07/24/15 0510  AST 218* 104* 58* 25 21  ALT 146* 106* 79* 48 35  ALKPHOS 133* 148* 253* 216* 198*  BILITOT 0.4  0.3 0.3 <0.1* 0.3  PROT 4.1* 4.2* 4.4* 4.5* 4.6*  ALBUMIN 1.5*  1.5* 1.2* 1.2* 1.1* 1.1*     Lipase     Component Value Date/Time   LIPASE 30 07/10/2015 0018     Studies/Results: Dg Chest Port 1 View  07/24/2015  CLINICAL DATA:  Respiratory failure EXAM: PORTABLE CHEST 1 VIEW COMPARISON:  07/23/2015 FINDINGS: Support devices in unchanged position including right PICC line with tip well into the right atrium. Heart size normal. Vascular pattern normal. Low lung volumes with stable mild bilateral lower lobe atelectasis. IMPRESSION: No change from prior study with mild atelectasis bilaterally. Unchanged position of lines and tubes as described above including right PICC line with tip deep into right atrium Electronically Signed   By: Skipper Cliche M.D.   On: 07/24/2015 07:24   Dg Chest Portable 1 View  07/23/2015  CLINICAL DATA:  Abdominal wound. EXAM: PORTABLE CHEST 1 VIEW COMPARISON:  07/21/2015. FINDINGS: Endotracheal tube, NG tube, right PICC line, right IJ line in stable position. Mediastinum and hilar structures are normal. Heart size normal. Low lung volumes with mild bibasilar atelectasis. No pleural effusion or pneumothorax. Surgical clips left chest. IMPRESSION: 1. Lines and tubes in stable position. 2. Low lung volumes with mild bibasilar atelectasis. Electronically Signed   By: Marcello Moores  Register   On: 07/23/2015 07:57  Medications: . antiseptic oral rinse  7 mL Mouth Rinse QID  . chlorhexidine gluconate (SAGE KIT)  15 mL Mouth Rinse BID  . heparin subcutaneous  5,000 Units Subcutaneous Q8H  . insulin aspart  0-15 Units Subcutaneous Q4H  . insulin glargine  10 Units Subcutaneous QHS  . levothyroxine  56 mcg Intravenous Daily  . pantoprazole (PROTONIX) IV  40 mg Intravenous Q24H  . piperacillin-tazobactam (ZOSYN)  IV  3.375 g Intravenous Q8H  . potassium chloride  10 mEq Intravenous Q1 Hr x 4  . sodium chloride flush  10-40 mL Intracatheter Q12H   . Marland KitchenTPN (CLINIMIX-E) Adult 83  mL/hr at 07/24/15 0400  . Marland KitchenTPN (CLINIMIX-E) Adult    . sodium chloride 10 mL/hr at 07/24/15 0400  . amiodarone 30 mg/hr (07/24/15 0400)  . phenylephrine (NEO-SYNEPHRINE) Adult infusion Stopped (07/21/15 0700)    Assessment/Plan SBO due to mass from metastatic breast cancer -Path Shows: Metastatic Lobular Breast Carcinoma H/o exploratory laparotomy, SBR--Dr. Grandville Silos 07/10/15 Breakdown of previous small bowel anastomosis and peritonitis   Exploratory laparotomy with resection of anastomosis and placement of wound vac--Dr. Brantley Stage 07/19/15 POD#5 Re-exploration of abdomen, creation of small bowel anastomosis, abdominal wall closure--Dr. Redmond Pulling 5/30,  POD#3  ID-Zosyn D#6/10 peritonitis, Severe PCM-prealbumin 2.5,PICC, TPN Acute respiratory failure-appreciate CCM management. Wean to extubate per CCM Acute renal failure- Metastatic breast cancer/carcinomatosis AF with RVR-NSR on amio VTE prophylaxis-SCD/heparin  Plan: Vent per CCM, she has marked lower extremity edema and looks pretty swollen up to me.  Fluid balance is 6.9 liters; she is 13.65 KG up since 5/26 per flow chart.  Will defer to CCM, she just got off pressors, and is on TNA now.  I will increase her dressing changes.  Continue medical management.         LOS: 5 days    Kiera Hussey 07/24/2015 [phone]

## 2015-07-25 LAB — GLUCOSE, CAPILLARY
GLUCOSE-CAPILLARY: 150 mg/dL — AB (ref 65–99)
GLUCOSE-CAPILLARY: 159 mg/dL — AB (ref 65–99)
GLUCOSE-CAPILLARY: 161 mg/dL — AB (ref 65–99)
Glucose-Capillary: 141 mg/dL — ABNORMAL HIGH (ref 65–99)
Glucose-Capillary: 163 mg/dL — ABNORMAL HIGH (ref 65–99)
Glucose-Capillary: 165 mg/dL — ABNORMAL HIGH (ref 65–99)

## 2015-07-25 LAB — RENAL FUNCTION PANEL
Albumin: <1 g/dL — ABNORMAL LOW (ref 3.5–5.0)
Anion gap: 10 (ref 5–15)
BUN: 46 mg/dL — ABNORMAL HIGH (ref 6–20)
CALCIUM: 8 mg/dL — AB (ref 8.9–10.3)
CHLORIDE: 106 mmol/L (ref 101–111)
CO2: 22 mmol/L (ref 22–32)
CREATININE: 1.65 mg/dL — AB (ref 0.44–1.00)
GFR, EST AFRICAN AMERICAN: 35 mL/min — AB (ref >60)
GFR, EST NON AFRICAN AMERICAN: 30 mL/min — AB (ref >60)
Glucose, Bld: 146 mg/dL — ABNORMAL HIGH (ref 65–99)
Phosphorus: 5 mg/dL — ABNORMAL HIGH (ref 2.5–4.6)
Potassium: 3.7 mmol/L (ref 3.5–5.1)
SODIUM: 138 mmol/L (ref 135–145)

## 2015-07-25 LAB — TRIGLYCERIDES: Triglycerides: 95 mg/dL (ref <150)

## 2015-07-25 LAB — MAGNESIUM: Magnesium: 1.9 mg/dL (ref 1.7–2.4)

## 2015-07-25 MED ORDER — FAMOTIDINE IN NACL 20-0.9 MG/50ML-% IV SOLN
20.0000 mg | INTRAVENOUS | Status: DC
  Administered 2015-07-25: 20 mg via INTRAVENOUS
  Filled 2015-07-25: qty 50

## 2015-07-25 MED ORDER — FUROSEMIDE 10 MG/ML IJ SOLN
40.0000 mg | Freq: Once | INTRAMUSCULAR | Status: AC
  Administered 2015-07-25: 40 mg via INTRAVENOUS
  Filled 2015-07-25: qty 4

## 2015-07-25 MED ORDER — TRACE MINERALS CR-CU-MN-SE-ZN 10-1000-500-60 MCG/ML IV SOLN
INTRAVENOUS | Status: AC
  Administered 2015-07-25: 18:00:00 via INTRAVENOUS
  Filled 2015-07-25: qty 2400

## 2015-07-25 MED ORDER — MAGNESIUM SULFATE 2 GM/50ML IV SOLN
2.0000 g | Freq: Once | INTRAVENOUS | Status: AC
  Administered 2015-07-25: 2 g via INTRAVENOUS
  Filled 2015-07-25: qty 50

## 2015-07-25 MED ORDER — DEXAMETHASONE SODIUM PHOSPHATE 4 MG/ML IJ SOLN
4.0000 mg | Freq: Four times a day (QID) | INTRAMUSCULAR | Status: AC
Start: 2015-07-25 — End: 2015-07-26
  Administered 2015-07-25 – 2015-07-26 (×4): 4 mg via INTRAVENOUS
  Filled 2015-07-25 (×4): qty 1

## 2015-07-25 MED ORDER — POTASSIUM CHLORIDE 10 MEQ/50ML IV SOLN
10.0000 meq | INTRAVENOUS | Status: AC
  Administered 2015-07-25 (×4): 10 meq via INTRAVENOUS
  Filled 2015-07-25 (×4): qty 50

## 2015-07-25 NOTE — Progress Notes (Signed)
PARENTERAL NUTRITION CONSULT NOTE - Follow Up  Pharmacy Consult for TPN Indication: Prolonged ileus  No Known Allergies  Patient Measurements: Height: 5\' 5"  (165.1 cm) Weight: 192 lb 14.4 oz (87.5 kg) IBW/kg (Calculated) : 57 Adjusted Body Weight: 74 Usual Weight: 76kg preop on 5/28  Vital Signs: Temp: 99 F (37.2 C) (06/03 0445) Temp Source: Axillary (06/03 0445) BP: 158/81 mmHg (06/03 0700) Pulse Rate: 108 (06/03 0700)  Medical History: Past Medical History  Diagnosis Date  . Cancer (HCC)     breast  . Hypertension   . Diabetes mellitus   . Arthritis   . Breast cancer ([place])   . CKD (chronic kidney disease)   . Hypothyroidism 06/22/2015    Insulin Requirements in the past 24 hours:  18 units of SSI 60 units of regular insulin in TPN 17 units of daily Lantus   Admit:  70yo female admitted 5/28 with abdominal pain s/p SBR for malignant SB obstruction (metastatic breast CA) on 5/19 and d/c to SNF 3 days prior.  She developed severe abdominal pain with CT (+)free air and free fluid.    Assessment:  Pt with mostly poor PO intake over 5/19 admission and expected same with development of abdominal discomfort and pain during her 3 days at SNF, as well as expectation for prolonged ileus.  Pt is on TPN  Surgeries/Procedures: 5/19  Small bowel resection for malignant SBO (primary: breast CA) 5/28  Emergent exp lap with resection of anastamosis, wound VAC 5/30 Reexploration with SB anastomosis and abdominal wall closure   GI:  Now s/p closure of abdominal wall with removal of wound VAC on 5/30, abd soft, hypoactive BS. Albumin is low at <1.0, prealbumin 2.5. NG output was back down to 461ml yesterday. PPI IV (spoke with surgery who would prefer to keep on PPI)  Endo:  Hx DM, CBGs have improved since increasing insulin in TPN and adding Lantus. CBGs 140-170s in last 24 hours. Hypothyroid.  Levothyroxine IV. Lytes: K low at 3.7 after replacement (goal > 4) Mg 1.9 (goal > 2) and  Phos up to 5.0. CoCa up to 10.5. Ca x Phos product borderline high. Renal:  Acute on chronic kidney disease.  SCr stable at 1.67, CrCl ~42ml/min. UOP good at 1.26ml/kg/hr. NS at 80ml/hr Pulm:  Intubated, weaning. ETT was negative for leak yesterday. Cards:  Hx HTN.  AFib with RVR.  Neo gtt off, on amiodarone infusion, CVP 20  Hepatobil:  LFTs elevated but trending down. TBili wnl.  TG down to wnl Neuro: Propofol off, Fent PRN, RASS -1 (goal 0) ID:  Day #6 of Zosyn for peritonitis.  Afebrile, WBC 12.2  Best Practices:  Heparin SQ, mouthcare TPN Access:  PICC Triple Lumen 5/31 > TPN start date:  5/29 >>  Current Nutrition:  NPO Clinimix E 5/15 at 29ml/hr  Nutritional Goals: per RD recs on 6/1 KCal: 1668 Protein: 120-130 g  Plan:  Continue Clinimix E 5/15 at 100 ml/hr Hold 20% lipid emulsion for first 7 days for ICU patients per ASPEN guidelines (Start date 6/5)  Continue NS at [place] provides 120 g of protein and 1704 kCals per day meeting 100% of protein and kCal needs Continue MVI and TE in TPN Continue 60 units of regular insulin in TPN Continue moderate SSI and adjust as needed Monitor TPN labs, daily renal function panel F/U improvement in ileus, advancement of diet  Give Mg 2g IV x 1 Give KCl 49mEq IV x 4 runs  [employer],  PharmD, [facility] Clinical Pharmacist Pager [phone] 07/25/2015 7:43 AM

## 2015-07-25 NOTE — Progress Notes (Addendum)
PULMONARY / CRITICAL CARE MEDICINE   Name: Monica Morrison MRN: [id number] DOB: Apr 30, 1945    ADMISSION DATE:  07/19/2015 CONSULTATION DATE:  07/19/15  REFERRING MD:  Dr. Brantley Stage   CHIEF COMPLAINT:  Malignant Small Bowel Obstruction  HISTORY OF PRESENT ILLNESS:  [name] with PMH of HTN, DM II, CKD, HTN, arthritis, hypothyroidism, breast cancer and recent admission from 5/19-5/24 for a small bowel obstruction that was found to be metastatic carcinoma s/p exploratory lap / small bowel resection who presented to [facility] on 5/28 with complaints of severe abdominal pain.    The patient was seen in the ER at [facility] for evaluation of abdominal pain.  Initial CT of the abdomen showed free air, a large quantity of free fluid and oral contrast.  Labs - Na 140, K 3.3, Hgb 13.3 / Hct 39, ionized calcium 1.0.  She was transferred to [facility] for operative evaluation. The patient was taken urgently to the OR for exploratory laparotomy with small bowel anastomosis resection.  She returned to the ICU post-operatively on mechanical ventilation.  Post-operatively, she was noted to have runs of SVT & a right mainstem intubation.  Repeat labs pending.    SUBJECTIVE: No acute events overnight. Patient had Fentanyl last at 00:30 this AM. Remains off Propofol gtt. Nods not to any pain or difficulty breathing. We will most likely need to extubate and observe  REVIEW OF SYSTEMS: Unable to obtain given intubation.  VITAL SIGNS: BP 131/70 mmHg  Pulse 108  Temp(Src) 99.4 F (37.4 C) (Axillary)  Resp 20  Ht 5\' 5"  (1.651 m)  Wt 192 lb 14.4 oz (87.5 kg)  BMI 32.10 kg/m2  SpO2 100%  HEMODYNAMICS:    VENTILATOR SETTINGS: Vent Mode:  [-] PSV;CPAP FiO2 (%):  [40 %] 40 % Set Rate:  [20 bmp] 20 bmp Vt Set:  [460 mL] 460 mL PEEP:  [5 cmH20] 5 cmH20 Pressure Support:  [10 cmH20] 10 cmH20 Plateau Pressure:  [16 cmH20-22 cmH20] 17 cmH20  INTAKE / OUTPUT: I/O last 3 completed shifts: In: 4682.1 [I.V.:881.2;  NG/GT:180; IV Piggyback:400] Out: [id number] [Urine:3915; Emesis/NG output:700]  PHYSICAL EXAMINATION: General:  Eyes closed. No distress. Tracks and nods Neuro:  Following commands. Moving all 4 extremities. Nods to questions. HEENT:  ETT in place. No scleral icterus or injection. Cardiovascular:  No edema. Sinus rhythm on telemetry. Regular rate. Lungs:  Clear on auscultation bilaterally. Symmetric chest rise on ventilator. Normal effort on PS 10 with Vt 700 Abdomen:  Soft. Hypoactive bowel sounds. Integument:  Warm & dry. No rash on exposed skin. Abdominal dressing clean & dry.   LABS:  BMET  Recent Labs Lab 07/23/15 0440 07/24/15 0510 07/25/15 0510  NA 137 137 138  K 3.3* 3.6 3.7  CL 109 110 106  CO2 19* 22 22  BUN 31* 37* 46*  CREATININE 1.66* 1.67* 1.65*  GLUCOSE 166* 179* 146*    Electrolytes  Recent Labs Lab 07/23/15 0440 07/24/15 0510 07/25/15 0510  CALCIUM 7.2* 7.6* 8.0*  MG 2.3 2.1 1.9  PHOS 4.0 4.7* 5.0*    CBC  Recent Labs Lab 07/21/15 0440 07/23/15 0440 07/24/15 0510  WBC 13.6* 13.4* 12.2*  HGB 8.3* 8.4* 8.4*  HCT 27.1* 27.1* 26.7*  PLT 246 238 257    Coag's No results for input(s): APTT, INR in the last 168 hours.  Sepsis Markers  Recent Labs Lab 07/19/15 1847  LATICACIDVEN 1.5    ABG  Recent Labs Lab 07/19/15 0753 07/19/15 1040 07/20/15  0555  PHART 7.152* 7.264* 7.333*  PCO2ART 47.8* 48.2* 36.6  PO2ART 373.0* 472.0* 156.0*    Liver Enzymes  Recent Labs Lab 07/22/15 0353 07/23/15 0440 07/24/15 0510 07/25/15 0510  AST 58* 25 21  --   ALT 79* 48 35  --   ALKPHOS 253* 216* 198*  --   BILITOT 0.3 <0.1* 0.3  --   ALBUMIN 1.2* 1.1* 1.1* <1.0*    Cardiac Enzymes  Recent Labs Lab 07/19/15 1040 07/19/15 1536 07/19/15 2138  TROPONINI 0.06* 0.06* 0.06*    Glucose  Recent Labs Lab 07/24/15 1549 07/24/15 2008 07/24/15 2339 07/25/15 0443 07/25/15 0815 07/25/15 1207  GLUCAP 178* 163* 165* 141* 150* 163*     Imaging No results found.   STUDIES:  CT ABD/Pelvis 5/28 (OSH): bowel perforation with large volume free air, free fluid, extraluminal enteric contrast in the pelvis, known intra-abdominal metastatic disease, bilateral adrenal masses, liver lesion on prior exam is not as well visualized, cholelithiasis with gallbladder distention.    PORT CXR 5/29:  Endotracheal tube in good position. R IJ CVL in right atrium. NGT coursing below stomach. EKG 5/29:  A fib w/ RVR. PORT CXR 5/30:  R IJ CVL in SVC. ETT in good position. PORT CXR 6/2:  R PICC w/ tip in atrium. ETT in good position. Low lung volumes with minimal atelectasis.  MICROBIOLOGY: MRSA PCR 5/28:  Negative   ANTIBIOTICS: Diflucan 5/28 (perf) - 5/31 Zosyn 5/28 >>   SIGNIFICANT EVENTS: 5/28  Re-Admit with abd pain, found to have perforation of anastomosis. Returned to ICU on vent post-op  LINES/TUBES: L FEM ART LINE 5/28 - 6/1 R IJ CVC 5/28 - 6/1 ETT 5/28 >>  NGT R NARE 5/28 >> FOLEY 5/28 >> R PICC 5/30>>>  ASSESSMENT / PLAN:  PULMONARY A: Acute Respiratory Failure No Cuff Leak  P:   Continue Full Vent Support SBT daily Intermittent Lasix for diuresis to help with cuff leak W\e will most likely need to extubate and observe her.  CARDIOVASCULAR A:  A fib w/ RVR Paroxysmal - No h/o A fib. Now SR H/O HTN  P:  Continuing amio gtt  Telemetry monitoring Cont hold home lopressor, lipitor   RENAL Lab Results  Component Value Date   CREATININE 1.65* 07/25/2015   CREATININE 1.67* 07/24/2015   CREATININE 1.66* 07/23/2015   CREATININE 1.4* 02/09/2015   CREATININE 1.5* 02/06/2014   CREATININE 1.3* 03/12/2012    Recent Labs Lab 07/23/15 0440 07/24/15 0510 07/25/15 0510  K 3.3* 3.6 3.7     A:   Acute Renal Failure - Stable creatinine. Metabolic Acidosis - Stable. Hypokalemia - Resolved. Hypomagnesemia - Resolved.  P:   Trending electrolytes & renal function daily Trending UOP with  Foley  GASTROINTESTINAL A:   Metastatic Carcinoma with Small Bowel Obstruction  Transaminitis - Resolved.  P:   Post Op Mgt per CCS NPO TPN advancing to 100cc/hr Protonix IV q24hr NGT to remain in place post extubation  HEMATOLOGIC/ONCOLOGIC  Recent Labs  07/23/15 0440 07/24/15 0510  HGB 8.4* 8.4*    A:   Anemia - No signs of active bleeding. H/O Breast Cancer   P:  Monitor cell counts daily w/ CBC Transfuse per ICU guidelines Heparin  q8hr SCDs  INFECTIOUS A:   Peritonitis - s/p perforation of anastomosis in the setting of metastatic carcinoma   P:   Zosyn  Day #7 Trending leukocyte count & monitoring for fever  ENDOCRINE A:   Hypothyroidism  DM II  -  BG better controlled but still elevated.  P:   Synthroid 67mcg IV daily Moderate dose SSI per algorithm Increase Lantus 17u QHS Hold home glipizide, actos TPN rate advancing to 100cc/hr  NEUROLOGIC A:   Acute Encephalopathy - Post op small bowel resection. Resolving.  P:   RASS goal: 0 Propofol gtt off for sedation  PRN fentanyl for pain  Plan to D/C Propofol post extubation PT/OT Consult post extubation  FAMILY  - Updates:  No family at bedside am 6/2.    - Inter-disciplinary family meet or Palliative Care meeting due by:  6/3   TODAY'S SUMMARY:  70 year old female with peritonitis status post resection and abdominal closure. Mental status remains stable. Patient is following commands. Patient has tolerated spontaneous breathing trial on pressure support 0/5 for greater than 30 minutes with stable medical ventilation and hemodynamics. Holding on extubation given absence of cuff leak & gentle diuresis. Increasing Lantus for control of blood glucose. Plan to leave NG tube in place post extubation. She is weanable but has potential to fail extubation.   Richardson Landry Minor ACNP Maryanna Shape PCCM Pager [phone] till 3 pm If no answer page [phone] 07/25/2015, 2:45 PM   Attending note: I have seen and  examined the patient with nurse practitioner/resident and agree with the note. History, labs and imaging reviewed.  Still with no cuff leak. Continue lasix diuresis. Start decadrpm for airway edema. Decadron for 24 hrs. Repeat CXR in AM  Rest of plan as below. Critical care time- 35 mins. This represents my time independent of the NPs time taking care of the patient.  Marshell Garfinkel MD [place] [facility] Pager [phone] If no answer or after 3pm call: [phone] 07/25/2015, 4:33 PM

## 2015-07-25 NOTE — Progress Notes (Signed)
Patient has negative cuff leak, placed on wean with psv10/5. Pt tolerating well.

## 2015-07-25 NOTE — Progress Notes (Signed)
4 Days Post-Op  Subjective: Ongoing full vent support, concerned with lack of cuff leak and edema.  She is down 2 KG from yesterday.  She is more awake this AM than she was yesterday.  Abdomen is OK, wound is stable.    Objective: Vital signs in last 24 hours: Temp:  [98.7 F (37.1 C)-99.8 F (37.7 C)] 99 F (37.2 C) (06/03 0445) Pulse Rate:  [80-111] 108 (06/03 0700) Resp:  [16-28] 20 (06/03 0700) BP: (123-158)/(68-88) 158/81 mmHg (06/03 0700) SpO2:  [100 %] 100 % (06/03 0700) FiO2 (%):  [40 %] 40 % (06/03 0400) Weight:  [87.5 kg (192 lb 14.4 oz)] 87.5 kg (192 lb 14.4 oz) (06/03 0400) Last BM Date: 07/24/15 3265 IV fluids yesterday 2840 urine 400 from the NG Afebrile, VSS SR on telem   Vent FiO1 40%   Weight down to 87.5 this AM down 2 KG from yesterday Labs stable Intake/Output from previous day: 06/02 0701 - 06/03 0700 In: 3265.7 [I.V.:600.8; NG/GT:90; IV Piggyback:350; TPN:2224.9] Out: 3240 [Urine:2840; Emesis/NG output:400] Intake/Output this shift:    General appearance: alert, cooperative and still sedated and on Vent, she looks like she is trying to talk.   GI: soft, no bowel sounds, Open site is Ok some white non viable stuff at the base mid abdome, but other wise it looks fine.    Lab Results:   Recent Labs  07/23/15 0440 07/24/15 0510  WBC 13.4* 12.2*  HGB 8.4* 8.4*  HCT 27.1* 26.7*  PLT 238 257    BMET  Recent Labs  07/24/15 0510 07/25/15 0510  NA 137 138  K 3.6 3.7  CL 110 106  CO2 22 22  GLUCOSE 179* 146*  BUN 37* 46*  CREATININE 1.67* 1.65*  CALCIUM 7.6* 8.0*   PT/INR No results for input(s): LABPROT, INR in the last 72 hours.   Recent Labs Lab 07/20/15 0501 07/21/15 0440 07/22/15 0353 07/23/15 0440 07/24/15 0510 07/25/15 0510  AST 218* 104* 58* 25 21  --   ALT 146* 106* 79* 48 35  --   ALKPHOS 133* 148* 253* 216* 198*  --   BILITOT 0.4 0.3 0.3 <0.1* 0.3  --   PROT 4.1* 4.2* 4.4* 4.5* 4.6*  --   ALBUMIN 1.5*  1.5* 1.2*  1.2* 1.1* 1.1* <1.0*     Lipase     Component Value Date/Time   LIPASE 30 07/10/2015 0018     Studies/Results: Dg Chest Port 1 View  07/24/2015  CLINICAL DATA:  Respiratory failure EXAM: PORTABLE CHEST 1 VIEW COMPARISON:  07/23/2015 FINDINGS: Support devices in unchanged position including right PICC line with tip well into the right atrium. Heart size normal. Vascular pattern normal. Low lung volumes with stable mild bilateral lower lobe atelectasis. IMPRESSION: No change from prior study with mild atelectasis bilaterally. Unchanged position of lines and tubes as described above including right PICC line with tip deep into right atrium Electronically Signed   By: Skipper Cliche M.D.   On: 07/24/2015 07:24    Medications: . antiseptic oral rinse  7 mL Mouth Rinse QID  . chlorhexidine gluconate (SAGE KIT)  15 mL Mouth Rinse BID  . heparin subcutaneous  5,000 Units Subcutaneous Q8H  . insulin aspart  0-15 Units Subcutaneous Q4H  . insulin glargine  17 Units Subcutaneous QHS  . levothyroxine  56 mcg Intravenous Daily  . magnesium sulfate 1 - 4 g bolus IVPB  2 g Intravenous Once  . pantoprazole (PROTONIX) IV  40  mg Intravenous Q24H  . piperacillin-tazobactam (ZOSYN)  IV  3.375 g Intravenous Q8H  . potassium chloride  10 mEq Intravenous Q1 Hr x 4  . sodium chloride flush  10-40 mL Intracatheter Q12H   . Marland KitchenTPN (CLINIMIX-E) Adult 100 mL/hr at 07/25/15 0400  . sodium chloride 10 mL/hr at 07/25/15 0400  . amiodarone 30 mg/hr (07/25/15 0400)   Assessment/Plan SBO due to mass from metastatic breast cancer -Path Shows: Metastatic Lobular Breast Carcinoma H/o exploratory laparotomy, SBR--Dr. Grandville Silos 07/10/15 Breakdown of previous small bowel anastomosis and peritonitis  Exploratory laparotomy with resection of anastomosis and placement of wound vac--Dr. Brantley Stage 07/19/15 POD#6 Re-exploration of abdomen, creation of small bowel anastomosis, abdominal wall closure--Dr. Redmond Pulling 5/30,  POD#4 ID-Zosyn D#6/10 peritonitis, Severe PCM-prealbumin 2.5,PICC, TPN Acute respiratory failure-appreciate CCM management. Wean to extubate per CCM Acute renal failure- Metastatic breast cancer/carcinomatosis AF with RVR-NSR on amio VTE prophylaxis-SCD/heparin  Plan:  CCM continues to most of the management.  Continue wet to dry dressing changes.  TNA, antibiotics as noted.        LOS: 6 days    Chrles Selley 07/25/2015 [phone]

## 2015-07-26 ENCOUNTER — Inpatient Hospital Stay (HOSPITAL_COMMUNITY): Payer: Commercial Managed Care - HMO

## 2015-07-26 LAB — RENAL FUNCTION PANEL
ALBUMIN: 1 g/dL — AB (ref 3.5–5.0)
Anion gap: 10 (ref 5–15)
BUN: 57 mg/dL — AB (ref 6–20)
CO2: 22 mmol/L (ref 22–32)
Calcium: 8.2 mg/dL — ABNORMAL LOW (ref 8.9–10.3)
Chloride: 105 mmol/L (ref 101–111)
Creatinine, Ser: 1.67 mg/dL — ABNORMAL HIGH (ref 0.44–1.00)
GFR calc Af Amer: 35 mL/min — ABNORMAL LOW (ref >60)
GFR calc non Af Amer: 30 mL/min — ABNORMAL LOW (ref >60)
GLUCOSE: 156 mg/dL — AB (ref 65–99)
PHOSPHORUS: 5.1 mg/dL — AB (ref 2.5–4.6)
POTASSIUM: 4.1 mmol/L (ref 3.5–5.1)
Sodium: 137 mmol/L (ref 135–145)

## 2015-07-26 LAB — BASIC METABOLIC PANEL
Anion gap: 10 (ref 5–15)
BUN: 56 mg/dL — AB (ref 6–20)
CALCIUM: 8.2 mg/dL — AB (ref 8.9–10.3)
CHLORIDE: 106 mmol/L (ref 101–111)
CO2: 22 mmol/L (ref 22–32)
CREATININE: 1.78 mg/dL — AB (ref 0.44–1.00)
GFR calc non Af Amer: 28 mL/min — ABNORMAL LOW (ref >60)
GFR, EST AFRICAN AMERICAN: 32 mL/min — AB (ref >60)
GLUCOSE: 155 mg/dL — AB (ref 65–99)
Potassium: 4.1 mmol/L (ref 3.5–5.1)
Sodium: 138 mmol/L (ref 135–145)

## 2015-07-26 LAB — GLUCOSE, CAPILLARY
GLUCOSE-CAPILLARY: 178 mg/dL — AB (ref 65–99)
GLUCOSE-CAPILLARY: 182 mg/dL — AB (ref 65–99)
GLUCOSE-CAPILLARY: 194 mg/dL — AB (ref 65–99)
Glucose-Capillary: 175 mg/dL — ABNORMAL HIGH (ref 65–99)
Glucose-Capillary: 176 mg/dL — ABNORMAL HIGH (ref 65–99)
Glucose-Capillary: 218 mg/dL — ABNORMAL HIGH (ref 65–99)

## 2015-07-26 LAB — CBC
HEMATOCRIT: 27.4 % — AB (ref 36.0–46.0)
HEMOGLOBIN: 8.4 g/dL — AB (ref 12.0–15.0)
MCH: 24 pg — AB (ref 26.0–34.0)
MCHC: 30.7 g/dL (ref 30.0–36.0)
MCV: 78.3 fL (ref 78.0–100.0)
Platelets: 328 10*3/uL (ref 150–400)
RBC: 3.5 MIL/uL — ABNORMAL LOW (ref 3.87–5.11)
RDW: 18.3 % — AB (ref 11.5–15.5)
WBC: 16.7 10*3/uL — ABNORMAL HIGH (ref 4.0–10.5)

## 2015-07-26 LAB — TRIGLYCERIDES: Triglycerides: 80 mg/dL (ref <150)

## 2015-07-26 LAB — MAGNESIUM: MAGNESIUM: 2.1 mg/dL (ref 1.7–2.4)

## 2015-07-26 MED ORDER — TRACE MINERALS CR-CU-MN-SE-ZN 10-1000-500-60 MCG/ML IV SOLN
INTRAVENOUS | Status: AC
  Administered 2015-07-26: 17:00:00 via INTRAVENOUS
  Filled 2015-07-26: qty 2400

## 2015-07-26 MED ORDER — GERHARDT'S BUTT CREAM
TOPICAL_CREAM | Freq: Four times a day (QID) | CUTANEOUS | Status: DC | PRN
  Filled 2015-07-26 (×2): qty 1

## 2015-07-26 MED ORDER — FUROSEMIDE 10 MG/ML IJ SOLN
40.0000 mg | Freq: Once | INTRAMUSCULAR | Status: AC
  Administered 2015-07-26: 40 mg via INTRAVENOUS
  Filled 2015-07-26: qty 4

## 2015-07-26 NOTE — Progress Notes (Signed)
PULMONARY / CRITICAL CARE MEDICINE   Name: Monica Morrison MRN: [id number] DOB: 03/08/1945    ADMISSION DATE:  07/19/2015 CONSULTATION DATE:  07/19/15  REFERRING MD:  Dr. Brantley Stage   CHIEF COMPLAINT:  Malignant Small Bowel Obstruction  HISTORY OF PRESENT ILLNESS:  [name] with PMH of HTN, DM II, CKD, HTN, arthritis, hypothyroidism, breast cancer and recent admission from 5/19-5/24 for a small bowel obstruction that was found to be metastatic carcinoma s/p exploratory lap / small bowel resection who presented to [facility] on 5/28 with complaints of severe abdominal pain.    The patient was seen in the ER at [facility] for evaluation of abdominal pain.  Initial CT of the abdomen showed free air, a large quantity of free fluid and oral contrast.  Labs - Na 140, K 3.3, Hgb 13.3 / Hct 39, ionized calcium 1.0.  She was transferred to [facility] for operative evaluation. The patient was taken urgently to the OR for exploratory laparotomy with small bowel anastomosis resection.  She returned to the ICU post-operatively on mechanical ventilation.  Post-operatively, she was noted to have runs of SVT & a right mainstem intubation.  Repeat labs pending.    SUBJECTIVE: No acute events overnight. More awake and may be weanable REVIEW OF SYSTEMS: Unable to obtain given intubation.  VITAL SIGNS: BP 128/76 mmHg  Pulse 81  Temp(Src) 98.4 F (36.9 C) (Oral)  Resp 15  Ht 5\' 5"  (1.651 m)  Wt 189 lb 9.5 oz (86 kg)  BMI 31.55 kg/m2  SpO2 100%  HEMODYNAMICS:    VENTILATOR SETTINGS: Vent Mode:  [-] PSV FiO2 (%):  [40 %] 40 % Set Rate:  [20 bmp] 20 bmp Vt Set:  [460 mL] 460 mL PEEP:  [5 cmH20] 5 cmH20 Pressure Support:  [10 cmH20] 10 cmH20 Plateau Pressure:  [15 cmH20-19 cmH20] 17 cmH20  INTAKE / OUTPUT: I/O last 3 completed shifts: In: 5177.9 [I.V.:917.9; NG/GT:60; IV Piggyback:500] Out: [id number] [Urine:4725; Emesis/NG output:1050]  PHYSICAL EXAMINATION: General:  Eyes closed. No distress. Tracks and  nods, follows commands Neuro:  Following commands. Moving all 4 extremities. Nods to questions. HEENT:  ETT in place. No scleral icterus or injection. Cardiovascular:  No edema. Sinus rhythm on telemetry. Regular rate. Lungs:  Clear on auscultation bilaterally. Symmetric chest rise on ventilator. Normal effort on PS 10 with Vt 750 rr 16 Abdomen:  Soft. Hypoactive bowel sounds. Integument:  Warm & dry. No rash on exposed skin. Abdominal dressing clean & dry.   LABS:  BMET  Recent Labs Lab 07/24/15 0510 07/25/15 0510 07/26/15 0345  NA 137 138 138  137  K 3.6 3.7 4.1  4.1  CL 110 106 106  105  CO2 22 22 22  22   BUN 37* 46* 56*  57*  CREATININE 1.67* 1.65* 1.78*  1.67*  GLUCOSE 179* 146* 155*  156*    Electrolytes  Recent Labs Lab 07/24/15 0510 07/25/15 0510 07/26/15 0345  CALCIUM 7.6* 8.0* 8.2*  8.2*  MG 2.1 1.9 2.1  PHOS 4.7* 5.0* 5.1*    CBC  Recent Labs Lab 07/23/15 0440 07/24/15 0510 07/26/15 0345  WBC 13.4* 12.2* 16.7*  HGB 8.4* 8.4* 8.4*  HCT 27.1* 26.7* 27.4*  PLT 238 257 328    Coag's No results for input(s): APTT, INR in the last 168 hours.  Sepsis Markers  Recent Labs Lab 07/19/15 1847  LATICACIDVEN 1.5    ABG  Recent Labs Lab 07/20/15 0555  PHART 7.333*  PCO2ART 36.6  PO2ART 156.0*    Liver Enzymes  Recent Labs Lab 07/22/15 0353 07/23/15 0440 07/24/15 0510 07/25/15 0510 07/26/15 0345  AST 58* 25 21  --   --   ALT 79* 48 35  --   --   ALKPHOS 253* 216* 198*  --   --   BILITOT 0.3 <0.1* 0.3  --   --   ALBUMIN 1.2* 1.1* 1.1* <1.0* 1.0*    Cardiac Enzymes  Recent Labs Lab 07/19/15 1536 07/19/15 2138  TROPONINI 0.06* 0.06*    Glucose  Recent Labs Lab 07/25/15 1207 07/25/15 1706 07/25/15 2002 07/25/15 2357 07/26/15 0406 07/26/15 0831  GLUCAP 163* 159* 161* 178* 175* 176*    Imaging Dg Chest Port 1 View  07/26/2015  CLINICAL DATA:  Acute respiratory failure EXAM: PORTABLE CHEST 1 VIEW COMPARISON:   07/24/2015 FINDINGS: Support devices are unchanged. Heart is normal size. Slight elevation of the right hemidiaphragm with right base atelectasis. No confluent opacity on the left. No effusions. IMPRESSION: Mild right base atelectasis, stable. Electronically Signed   By: Rolm Baptise M.D.   On: 07/26/2015 07:47     STUDIES:  CT ABD/Pelvis 5/28 (OSH): bowel perforation with large volume free air, free fluid, extraluminal enteric contrast in the pelvis, known intra-abdominal metastatic disease, bilateral adrenal masses, liver lesion on prior exam is not as well visualized, cholelithiasis with gallbladder distention.    PORT CXR 5/29:  Endotracheal tube in good position. R IJ CVL in right atrium. NGT coursing below stomach. EKG 5/29:  A fib w/ RVR. PORT CXR 5/30:  R IJ CVL in SVC. ETT in good position. PORT CXR 6/2:  R PICC w/ tip in atrium. ETT in good position. Low lung volumes with minimal atelectasis.  MICROBIOLOGY: MRSA PCR 5/28:  Negative   ANTIBIOTICS: Diflucan 5/28 (perf) - 5/31 Zosyn 5/28 >>   SIGNIFICANT EVENTS: 5/28  Re-Admit with abd pain, found to have perforation of anastomosis. Returned to ICU on vent post-op  LINES/TUBES: L FEM ART LINE 5/28 - 6/1 R IJ CVC 5/28 - 6/1 ETT 5/28 >>  NGT R NARE 5/28 >> FOLEY 5/28 >> R PICC 5/30>>>  ASSESSMENT / PLAN:  PULMONARY A: Acute Respiratory Failure No Cuff Leak  P:   Continue Full Vent Support SBT daily Intermittent Lasix for diuresis to help with cuff leak W\e will most likely need to extubate and observe her.  CARDIOVASCULAR A:  A fib w/ RVR Paroxysmal - No h/o A fib. Now SR H/O HTN  P:  Continuing amio gtt  Telemetry monitoring Cont hold home lopressor, lipitor   RENAL Lab Results  Component Value Date   CREATININE 1.67* 07/26/2015   CREATININE 1.78* 07/26/2015   CREATININE 1.65* 07/25/2015   CREATININE 1.4* 02/09/2015   CREATININE 1.5* 02/06/2014   CREATININE 1.3* 03/12/2012    Recent Labs Lab  07/24/15 0510 07/25/15 0510 07/26/15 0345  K 3.6 3.7 4.1  4.1     A:   Acute Renal Failure - Stable creatinine. Metabolic Acidosis - Stable. Hypokalemia - Resolved. Hypomagnesemia - Resolved.  P:   Trending electrolytes & renal function daily Trending UOP with Foley  GASTROINTESTINAL A:   Metastatic Carcinoma with Small Bowel Obstruction  Transaminitis - Resolved.  P:   Post Op Mgt per CCS NPO TPN advancing to 100cc/hr Protonix IV q24hr NGT to remain in place post extubation  HEMATOLOGIC/ONCOLOGIC  Recent Labs  07/24/15 0510 07/26/15 0345  HGB 8.4* 8.4*    A:   Anemia -  No signs of active bleeding. H/O Breast Cancer   P:  Monitor cell counts daily w/ CBC Transfuse per ICU guidelines Heparin [place] q8hr SCDs  INFECTIOUS A:   Peritonitis - s/p perforation of anastomosis in the setting of metastatic carcinoma   P:   Zosyn  Day #7 Trending leukocyte count & monitoring for fever  ENDOCRINE A:   Hypothyroidism  DM II  - BG better controlled but still elevated.  P:   Synthroid 17mcg IV daily Moderate dose SSI per algorithm Increase Lantus 17u QHS Hold home glipizide, actos TPN rate advancing to 100cc/hr  NEUROLOGIC A:   Acute Encephalopathy - Post op small bowel resection. Resolving.  P:   RASS goal: 0 Propofol gtt off for sedation  PRN fentanyl for pain  Plan to D/C Propofol post extubation PT/OT Consult post extubation  FAMILY  - Updates:  No family at bedside am 6/2.    - Inter-disciplinary family meet or Palliative Care meeting due by:  6/3   TODAY'S SUMMARY:  70 year old female with peritonitis status post resection and abdominal closure. Mental status remains stable. Patient is following commands. Patient has tolerated spontaneous breathing trial on pressure support 0/5 for greater than 30 minutes with stable medical ventilation and hemodynamics. Holding on extubation given absence of cuff leak & gentle diuresis. Increasing Lantus for  control of blood glucose. Plan to leave NG tube in place post extubation. She is weanable but has potential to fail extubation. May be able extubate her 6/4 with negative i/o.   Richardson Landry Minor ACNP Maryanna Shape PCCM Pager [phone] till 3 pm If no answer page [phone] 07/26/2015, 10:45 AM   Attending note: I have seen and examined the patient with nurse practitioner/resident and agree with the note. History, labs and imaging reviewed.  Still with no cuff leak. Continue lasix diuresis. On decadron for airway edema.  We may just need to extubate her and see if she does well. Will plan on doing it in AM.   Rest of plan as below. Critical care time- 35 mins. This represents my time independent of the NPs time taking care of the patient.  Marshell Garfinkel MD [place] [facility] Pager [phone] If no answer or after 3pm call: [phone] 07/26/2015, 4:32 PM

## 2015-07-26 NOTE — Progress Notes (Signed)
CCS/Thinh Cuccaro Progress Note 5 Days Post-Op  Subjective: Patient still ventilated, no cuff leak.  Has been getting steroids and diuretics.  Objective: Vital signs in last 24 hours: Temp:  [98.4 F (36.9 C)-100 F (37.8 C)] 98.4 F (36.9 C) (06/04 0833) Pulse Rate:  [78-106] 81 (06/04 0820) Resp:  [14-29] 15 (06/04 0820) BP: (124-161)/(59-89) 128/76 mmHg (06/04 0820) SpO2:  [96 %-100 %] 100 % (06/04 0820) FiO2 (%):  [40 %] 40 % (06/04 0820) Weight:  [86 kg (189 lb 9.5 oz)] 86 kg (189 lb 9.5 oz) (06/04 0545) Last BM Date: 07/26/15  Intake/Output from previous day: 06/03 0701 - 06/04 0700 In: 3460.8 [I.V.:610.8; IV Piggyback:450; TPN:2400] Out: [id number] [Urine:3625; Emesis/NG output:750] Intake/Output this shift: Total I/O In: 156.7 [I.V.:26.7; NG/GT:30; TPN:100] Out: 410 [Urine:310; Emesis/NG output:100]  General: No distress, calm on the ventilator  Lungs: Clear to auscultation  Abd: soft, excellent wound granulation tissue.  Extremities: No clinical signs or symptoms of DVT  Neuro: Intact on ventilator.  Lab Results:  @LABLAST2 (wbc:2,hgb:2,hct:2,plt:2) BMET ) Recent Labs  07/25/15 0510 07/26/15 0345  NA 138 138  137  K 3.7 4.1  4.1  CL 106 106  105  CO2 22 22  22   GLUCOSE 146* 155*  156*  BUN 46* 56*  57*  CREATININE 1.65* 1.78*  1.67*  CALCIUM 8.0* 8.2*  8.2*   PT/INR No results for input(s): LABPROT, INR in the last 72 hours. ABG No results for input(s): PHART, HCO3 in the last 72 hours.  Invalid input(s): PCO2, PO2  Studies/Results: Dg Chest Port 1 View  07/26/2015  CLINICAL DATA:  Acute respiratory failure EXAM: PORTABLE CHEST 1 VIEW COMPARISON:  07/24/2015 FINDINGS: Support devices are unchanged. Heart is normal size. Slight elevation of the right hemidiaphragm with right base atelectasis. No confluent opacity on the left. No effusions. IMPRESSION: Mild right base atelectasis, stable. Electronically Signed   By: Rolm Baptise M.D.   On: 07/26/2015  07:47    Anti-infectives: Anti-infectives    Start     Dose/Rate Route Frequency Ordered Stop   07/19/15 1000  piperacillin-tazobactam (ZOSYN) IVPB 3.375 g     3.375 g 12.5 mL/hr over 240 Minutes Intravenous Every 8 hours 07/19/15 0828     07/19/15 0608  dextrose 5 % with cefOXitin (MEFOXIN) ADS Med    Comments:  [name]   : cabinet override      07/19/15 [id number] 07/19/15 0629      Assessment/Plan: s/p Procedure(s): REEXPLORATION ABDOMINAL WOUND/POSSIBLE BOWEL ANASTAMOSIS Stable overall from a surgical standpoint.  Possible extubation.  LOS: 7 days   Kathryne Eriksson. Dahlia Bailiff, MD, FACS [phone] [facility] 07/26/2015

## 2015-07-26 NOTE — Progress Notes (Signed)
PARENTERAL NUTRITION CONSULT NOTE - Follow Up  Pharmacy Consult for TPN Indication: Prolonged ileus  No Known Allergies  Patient Measurements: Height: 5\' 5"  (165.1 cm) Weight: 189 lb 9.5 oz (86 kg) IBW/kg (Calculated) : 57 Adjusted Body Weight: 74 Usual Weight: 76kg preop on 5/28  Vital Signs: Temp: 99.5 F (37.5 C) (06/04 0400) Temp Source: Oral (06/04 0400) BP: 133/66 mmHg (06/04 0700) Pulse Rate: 93 (06/04 0700)  Medical History: Past Medical History  Diagnosis Date  . Cancer (HCC)     breast  . Hypertension   . Diabetes mellitus   . Arthritis   . Breast cancer ([place])   . CKD (chronic kidney disease)   . Hypothyroidism 06/22/2015    Insulin Requirements in the past 24 hours:  17 units of SSI 60 units of regular insulin in TPN 17 units of daily Lantus   Admit:  70yo female admitted 5/28 with abdominal pain s/p SBR for malignant SB obstruction (metastatic breast CA) on 5/19 and d/c to SNF 3 days prior.  She developed severe abdominal pain with CT (+)free air and free fluid.    Assessment:  Pt with mostly poor PO intake over 5/19 admission and expected same with development of abdominal discomfort and pain during her 3 days at SNF, as well as expectation for prolonged ileus.  Pt is on TPN  Surgeries/Procedures: 5/19  Small bowel resection for malignant SBO (primary: breast CA) 5/28  Emergent exp lap with resection of anastamosis, wound VAC 5/30 Reexploration with SB anastomosis and abdominal wall closure   GI:  Now s/p closure of abdominal wall with removal of wound VAC on 5/30, abd soft, hypoactive BS. Albumin is low at 1.0, prealbumin 2.5. NG output was 727ml yesterday. Pepcid IV Endo:  Hx DM, CBGs have improved since increasing insulin in TPN and adding Lantus. CBGs 150-170s in last 24 hours. Hypothyroid.  Levothyroxine IV. Lytes: K up to 4.1 after replacement (goal > 4) Mg 2.1 (goal > 2) and Phos up to 5.1. CoCa up to 10.7. Ca x Phos product borderline  high. Renal:  Acute on chronic kidney disease.  SCr stable at 1.67, CrCl ~10ml/min. UOP good at 1.72ml/kg/hr. NS at 57ml/hr Pulm:  Intubated, weaning. ETT was negative for leak yesterday. Started Decadron to help with inflammation. Cards:  Hx HTN.  AFib with RVR.  Neo gtt off, on amiodarone infusion, CVP 20  Hepatobil:  LFTs elevated but trending down. TBili wnl.  TG down to wnl Neuro: Propofol off, Fent PRN, RASS -1 (goal 0) ID:  Day #7 of Zosyn for peritonitis.  Afebrile, WBC up to 16.7  Best Practices:  Heparin SQ, mouthcare TPN Access:  PICC Triple Lumen 5/31 > TPN start date:  5/29 >>  Current Nutrition:  NPO Clinimix E 5/15 at 153ml/hr  Nutritional Goals: per RD recs on 6/1 KCal: 1668 Protein: 120-130 g  Plan:  Change to Clinimix 5/15 (w/o lytes) at 100 ml/hr Hold 20% lipid emulsion for first 7 days for ICU patients per ASPEN guidelines (Start date 6/5)  Continue NS at [place] provides 120 g of protein and 1704 kCals per day meeting 100% of protein and kCal needs Continue MVI and TE in TPN Add Pepcid 20mg  to TPN and stop Pepcid IV Continue 60 units of regular insulin in TPN Continue moderate SSI and adjust as needed Monitor TPN labs, daily renal function panel F/U improvement in ileus, extubation, advancement of diet  Monica Morrison, PharmD, BCPS Clinical Pharmacist Pager [phone] 07/26/2015 7:23  AM        

## 2015-07-26 NOTE — Progress Notes (Signed)
[facility] Progress Note Patient Name: Monica Morrison DOB: [birth year] MRN: [id number]   Date of Service  07/26/2015  HPI/Events of Note  Request for Gerhardt's butt cream.  eICU Interventions  Will order Gerhardt's butt cream.      Intervention Category Minor Interventions: Routine modifications to care plan (e.g. PRN medications for pain, fever)  Lysle Dingwall 07/26/2015, 6:47 PM

## 2015-07-27 ENCOUNTER — Inpatient Hospital Stay (HOSPITAL_COMMUNITY): Payer: Commercial Managed Care - HMO

## 2015-07-27 LAB — COMPREHENSIVE METABOLIC PANEL
ALBUMIN: 1.1 g/dL — AB (ref 3.5–5.0)
ALK PHOS: 193 U/L — AB (ref 38–126)
ALT: 24 U/L (ref 14–54)
ANION GAP: 9 (ref 5–15)
AST: 22 U/L (ref 15–41)
BILIRUBIN TOTAL: 0.3 mg/dL (ref 0.3–1.2)
BUN: 72 mg/dL — ABNORMAL HIGH (ref 6–20)
CALCIUM: 8.1 mg/dL — AB (ref 8.9–10.3)
CO2: 24 mmol/L (ref 22–32)
Chloride: 103 mmol/L (ref 101–111)
Creatinine, Ser: 1.98 mg/dL — ABNORMAL HIGH (ref 0.44–1.00)
GFR calc non Af Amer: 24 mL/min — ABNORMAL LOW (ref >60)
GFR, EST AFRICAN AMERICAN: 28 mL/min — AB (ref >60)
GLUCOSE: 164 mg/dL — AB (ref 65–99)
Potassium: 3.1 mmol/L — ABNORMAL LOW (ref 3.5–5.1)
Sodium: 136 mmol/L (ref 135–145)
TOTAL PROTEIN: 4.9 g/dL — AB (ref 6.5–8.1)

## 2015-07-27 LAB — DIFFERENTIAL
BASOS ABS: 0 10*3/uL (ref 0.0–0.1)
Band Neutrophils: 0 %
Basophils Relative: 0 %
Blasts: 0 %
EOS ABS: 0 10*3/uL (ref 0.0–0.7)
Eosinophils Relative: 0 %
LYMPHS PCT: 7 %
Lymphs Abs: 1.3 10*3/uL (ref 0.7–4.0)
Metamyelocytes Relative: 0 %
Monocytes Absolute: 1.3 10*3/uL — ABNORMAL HIGH (ref 0.1–1.0)
Monocytes Relative: 7 %
Myelocytes: 0 %
NEUTROS PCT: 86 %
Neutro Abs: 15.7 10*3/uL — ABNORMAL HIGH (ref 1.7–7.7)
OTHER: 0 %
PROMYELOCYTES ABS: 0 %
nRBC: 0 /100 WBC

## 2015-07-27 LAB — GLUCOSE, CAPILLARY
GLUCOSE-CAPILLARY: 130 mg/dL — AB (ref 65–99)
GLUCOSE-CAPILLARY: 133 mg/dL — AB (ref 65–99)
GLUCOSE-CAPILLARY: 169 mg/dL — AB (ref 65–99)
GLUCOSE-CAPILLARY: 201 mg/dL — AB (ref 65–99)
Glucose-Capillary: 164 mg/dL — ABNORMAL HIGH (ref 65–99)
Glucose-Capillary: 173 mg/dL — ABNORMAL HIGH (ref 65–99)
Glucose-Capillary: 159 mg/dL — ABNORMAL HIGH (ref 65–99)
Glucose-Capillary: 147 mg/dL — ABNORMAL HIGH (ref 65–99)

## 2015-07-27 LAB — CBC
HCT: 24.8 % — ABNORMAL LOW (ref 36.0–46.0)
Hemoglobin: 7.7 g/dL — ABNORMAL LOW (ref 12.0–15.0)
MCH: 24.4 pg — AB (ref 26.0–34.0)
MCHC: 31 g/dL (ref 30.0–36.0)
MCV: 78.5 fL (ref 78.0–100.0)
PLATELETS: 378 10*3/uL (ref 150–400)
RBC: 3.16 MIL/uL — AB (ref 3.87–5.11)
RDW: 18 % — AB (ref 11.5–15.5)
WBC: 18.3 10*3/uL — ABNORMAL HIGH (ref 4.0–10.5)

## 2015-07-27 LAB — PHOSPHORUS: Phosphorus: 4.6 mg/dL (ref 2.5–4.6)

## 2015-07-27 LAB — TRIGLYCERIDES: TRIGLYCERIDES: 105 mg/dL (ref <150)

## 2015-07-27 LAB — PREALBUMIN: PREALBUMIN: 11.1 mg/dL — AB (ref 18–38)

## 2015-07-27 LAB — MAGNESIUM: Magnesium: 1.8 mg/dL (ref 1.7–2.4)

## 2015-07-27 MED ORDER — MAGNESIUM SULFATE 2 GM/50ML IV SOLN
2.0000 g | Freq: Once | INTRAVENOUS | Status: AC
  Administered 2015-07-27: 2 g via INTRAVENOUS
  Filled 2015-07-27: qty 50

## 2015-07-27 MED ORDER — POTASSIUM CHLORIDE 10 MEQ/100ML IV SOLN
10.0000 meq | INTRAVENOUS | Status: AC
  Administered 2015-07-27 (×2): 10 meq via INTRAVENOUS
  Filled 2015-07-27 (×2): qty 100

## 2015-07-27 MED ORDER — IOPAMIDOL (ISOVUE-300) INJECTION 61%
INTRAVENOUS | Status: AC
  Filled 2015-07-27: qty 50

## 2015-07-27 MED ORDER — FAT EMULSION 20 % IV EMUL
240.0000 mL | INTRAVENOUS | Status: DC
  Administered 2015-07-27: 240 mL via INTRAVENOUS
  Filled 2015-07-27: qty 250

## 2015-07-27 MED ORDER — CETYLPYRIDINIUM CHLORIDE 0.05 % MT LIQD
7.0000 mL | Freq: Two times a day (BID) | OROMUCOSAL | Status: DC
  Administered 2015-07-28 – 2015-07-30 (×4): 7 mL via OROMUCOSAL

## 2015-07-27 MED ORDER — DIATRIZOATE MEGLUMINE & SODIUM 66-10 % PO SOLN
15.0000 mL | ORAL | Status: AC
  Administered 2015-07-27: 15 mL via ORAL
  Filled 2015-07-27: qty 30

## 2015-07-27 MED ORDER — POTASSIUM CHLORIDE 10 MEQ/50ML IV SOLN
10.0000 meq | Freq: Once | INTRAVENOUS | Status: AC
  Administered 2015-07-27: 10 meq via INTRAVENOUS
  Filled 2015-07-27: qty 50

## 2015-07-27 MED ORDER — CLINIMIX E/DEXTROSE (5/15) 5 % IV SOLN
INTRAVENOUS | Status: DC
  Administered 2015-07-27: 18:00:00 via INTRAVENOUS
  Filled 2015-07-27: qty 2400

## 2015-07-27 MED ORDER — DIATRIZOATE MEGLUMINE & SODIUM 66-10 % PO SOLN
ORAL | Status: AC
  Administered 2015-07-27: 10:00:00
  Filled 2015-07-27: qty 30

## 2015-07-27 MED ORDER — CHLORHEXIDINE GLUCONATE 0.12 % MT SOLN
15.0000 mL | Freq: Two times a day (BID) | OROMUCOSAL | Status: DC
  Administered 2015-07-27 – 2015-07-30 (×5): 15 mL via OROMUCOSAL
  Filled 2015-07-27 (×2): qty 15

## 2015-07-27 NOTE — Progress Notes (Signed)
Order for rectal tube received due to multiple watery stools.  Attempted x 3, poor tone, and pt then refused to have tube inserted.

## 2015-07-27 NOTE — Progress Notes (Signed)
Nutrition Follow Up  DOCUMENTATION CODES:   Not applicable  INTERVENTION:    TPN per pharmacy  NUTRITION DIAGNOSIS:   Inadequate oral intake related to inability to eat as evidenced by NPO status, ongoing  GOAL:   Patient will meet greater than or equal to 90% of their needs, met  MONITOR:   Diet advancement, Labs, Weight trends, Skin, I & O's  ASSESSMENT:   70 yo female admitted 5/28 with abdominal pain s/p SBR for malignant SB obstruction (metastatic breast CA) on 5/19 and d/c to SNF 3 days prior. She developed severe abdominal pain with CT (+)free air and free fluid. Pt is now s/p exp lap for perforated viscus with peritonitis- resection of anastamosis and placement of wound VAC.    Patient s/p procedures 5/30: REEXPLORATION ABDOMEN CREATION OF SMALL BOWEL ANASTOMOSIS ABDOMINAL WALL CLOSURE  Patient extubated this AM. Spoke with PharmD regarding TPN prescription.  NGT remains in place.  Patient is receiving TPN with Clinimix E 5/15 @ 100 ml/hr and lipids @ 10 ml/hr. Provides 2184 kcal and 120 grams protein per day. Meets 100% minimum estimated energy needs and 100% minimum estimated protein needs.  Diet Order:  TPN (CLINIMIX) Adult without lytes Diet NPO time specified  Skin:  Wound (see comment) (abdominal surgical incision with VAC)  Last BM:  6/4  Height:   Ht Readings from Last 1 Encounters:  07/22/15 _0  (1.651 m)    Weight:   Wt Readings from Last 1 Encounters:  07/26/15 189 lb 9.5 oz (86 kg)  Admission weight: 167 lb (75.751 kg); BMI=28.7  Ideal Body Weight:  54.5 kg  BMI:  Body mass index is 31.55 kg/(m^2).; 28.7 using admission weight  Estimated Nutritional Needs:   Kcal:  2000-2200  Protein:  120-130 gm  Fluid:  per MD  EDUCATION NEEDS:   No education needs identified at this time  Arthur Holms, RD, LDN Pager #: [phone] After-Hours Pager #: [phone]

## 2015-07-27 NOTE — Progress Notes (Signed)
PULMONARY / CRITICAL CARE MEDICINE   Name: Monica Morrison MRN: [id number] DOB: Feb 08, 1946    ADMISSION DATE:  07/19/2015 CONSULTATION DATE:  07/19/15  REFERRING MD:  Dr. Brantley Stage   CHIEF COMPLAINT:  Malignant Small Bowel Obstruction  HISTORY OF PRESENT ILLNESS:  [name] with PMH of HTN, DM II, CKD, HTN, arthritis, hypothyroidism, breast cancer and recent admission from 5/19-5/24 for a small bowel obstruction that was found to be metastatic carcinoma s/p exploratory lap / small bowel resection who presented to [facility] on 5/28 with complaints of severe abdominal pain.    The patient was seen in the ER at [facility] for evaluation of abdominal pain.  Initial CT of the abdomen showed free air, a large quantity of free fluid and oral contrast.  Labs - Na 140, K 3.3, Hgb 13.3 / Hct 39, ionized calcium 1.0.  She was transferred to [facility] for operative evaluation. The patient was taken urgently to the OR for exploratory laparotomy with small bowel anastomosis resection.  She returned to the ICU post-operatively on mechanical ventilation.  Post-operatively, she was noted to have runs of SVT & a right mainstem intubation.  Repeat labs pending.    SUBJECTIVE:  Tolerating PSV with VT > 500cc  VITAL SIGNS: BP 132/107 mmHg  Pulse 88  Temp(Src) 98.1 F (36.7 C) (Oral)  Resp 20  Ht 5\' 5"  (1.651 m)  Wt 86 kg (189 lb 9.5 oz)  BMI 31.55 kg/m2  SpO2 100%  HEMODYNAMICS:    VENTILATOR SETTINGS: Vent Mode:  [-] PSV;CPAP FiO2 (%):  [40 %] 40 % Set Rate:  [20 bmp] 20 bmp Vt Set:  [460 mL] 460 mL PEEP:  [5 cmH20] 5 cmH20 Pressure Support:  [5 cmH20-10 cmH20] 5 cmH20 Plateau Pressure:  [17 cmH20-21 cmH20] 19 cmH20  INTAKE / OUTPUT: I/O last 3 completed shifts: In: 4724.5 [I.V.:904.5; NG/GT:120; IV Piggyback:200] Out: [id number] [Urine:5035; Emesis/NG output:1350]  PHYSICAL EXAMINATION: General:  Eyes closed. No distress. Tracks and nods, follows commands Neuro:  Following commands. Moving all 4  extremities. Nods to questions. HEENT:  ETT in place. No scleral icterus or injection. Cardiovascular:  No edema. Sinus rhythm on telemetry. Regular rate. Lungs:  Clear on auscultation bilaterally.  Abdomen:  Soft. Hypoactive bowel sounds. Integument:  Warm & dry. No rash on exposed skin. Abdominal dressing clean & dry.   LABS:  BMET  Recent Labs Lab 07/25/15 0510 07/26/15 0345 07/27/15 0419  NA 138 138  137 136  K 3.7 4.1  4.1 3.1*  CL 106 106  105 103  CO2 22 22  22 24   BUN 46* 56*  57* 72*  CREATININE 1.65* 1.78*  1.67* 1.98*  GLUCOSE 146* 155*  156* 164*    Electrolytes  Recent Labs Lab 07/25/15 0510 07/26/15 0345 07/27/15 0419  CALCIUM 8.0* 8.2*  8.2* 8.1*  MG 1.9 2.1 1.8  PHOS 5.0* 5.1* 4.6    CBC  Recent Labs Lab 07/24/15 0510 07/26/15 0345 07/27/15 0419  WBC 12.2* 16.7* 18.3*  HGB 8.4* 8.4* 7.7*  HCT 26.7* 27.4* 24.8*  PLT 257 328 378    Coag's No results for input(s): APTT, INR in the last 168 hours.  Sepsis Markers No results for input(s): LATICACIDVEN, PROCALCITON, O2SATVEN in the last 168 hours.  ABG No results for input(s): PHART, PCO2ART, PO2ART in the last 168 hours.  Liver Enzymes  Recent Labs Lab 07/23/15 0440 07/24/15 0510 07/25/15 0510 07/26/15 0345 07/27/15 0419  AST 25 21  --   --  22  ALT 48 35  --   --  24  ALKPHOS 216* 198*  --   --  193*  BILITOT <0.1* 0.3  --   --  0.3  ALBUMIN 1.1* 1.1* <1.0* 1.0* 1.1*    Cardiac Enzymes No results for input(s): TROPONINI, PROBNP in the last 168 hours.  Glucose  Recent Labs Lab 07/26/15 1226 07/26/15 1620 07/26/15 1938 07/27/15 0020 07/27/15 0351 07/27/15 0825  GLUCAP 182* 194* 218* 201* 164* 173*    Imaging Dg Chest Port 1 View  07/27/2015  CLINICAL DATA:  Respiratory failure EXAM: PORTABLE CHEST 1 VIEW COMPARISON:  07/26/2015 FINDINGS: Support devices are stable. Heart is normal size. Mitral valve calcifications present. No confluent airspace opacity or  effusion. IMPRESSION: No acute cardiopulmonary disease. Electronically Signed   By: Rolm Baptise M.D.   On: 07/27/2015 07:23     STUDIES:  CT ABD/Pelvis 5/28 (OSH): bowel perforation with large volume free air, free fluid, extraluminal enteric contrast in the pelvis, known intra-abdominal metastatic disease, bilateral adrenal masses, liver lesion on prior exam is not as well visualized, cholelithiasis with gallbladder distention.    PORT CXR 5/29:  Endotracheal tube in good position. R IJ CVL in right atrium. NGT coursing below stomach. EKG 5/29:  A fib w/ RVR. PORT CXR 5/30:  R IJ CVL in SVC. ETT in good position. PORT CXR 6/2:  R PICC w/ tip in atrium. ETT in good position. Low lung volumes with minimal atelectasis.  MICROBIOLOGY: MRSA PCR 5/28:  Negative   ANTIBIOTICS: Diflucan 5/28 (perf) - 5/31 Zosyn 5/28 >>   SIGNIFICANT EVENTS: 5/28  Re-Admit with abd pain, found to have perforation of anastomosis. Returned to ICU on vent post-op  LINES/TUBES: L FEM ART LINE 5/28 - 6/1 R IJ CVC 5/28 - 6/1 ETT 5/28 >> 6/5 NGT R NARE 5/28 >> FOLEY 5/28 >> R PICC 5/30>>>  ASSESSMENT / PLAN:  PULMONARY A: Acute Respiratory Failure No Cuff Leak P:   Extubate 6/5 Intermittent Lasix, dose daily, none 6/5 given renal fxn  CARDIOVASCULAR A:  A fib w/ RVR Paroxysmal - No h/o A fib. Now SR H/O HTN P:  Continuing amio gtt  Telemetry monitoring Cont hold home lopressor, lipitor   RENAL Lab Results  Component Value Date   CREATININE 1.98* 07/27/2015   CREATININE 1.67* 07/26/2015   CREATININE 1.78* 07/26/2015   CREATININE 1.4* 02/09/2015   CREATININE 1.5* 02/06/2014   CREATININE 1.3* 03/12/2012    Recent Labs Lab 07/25/15 0510 07/26/15 0345 07/27/15 0419  K 3.7 4.1  4.1 3.1*  A:   Acute Renal Failure - note increase S Cr 6/5 after lasix Metabolic Acidosis - Stable. Hypokalemia - Resolved. Hypomagnesemia - Resolved. P:   Trending electrolytes & renal function Trending  UOP with Foley  GASTROINTESTINAL A:   Metastatic Carcinoma with Small Bowel Obstruction  Transaminitis - Resolved. P:   Post Op Mgt per CCS NPO TPN running NGT to remain in place post extubation  HEMATOLOGIC/ONCOLOGIC  Recent Labs  07/26/15 0345 07/27/15 0419  HGB 8.4* 7.7*   A:   Anemia - No signs of active bleeding. H/O Breast Cancer  P:  Monitor cell counts w/ CBC Transfuse per ICU guidelines Heparin [place] q8hr SCDs  INFECTIOUS A:   Peritonitis - s/p perforation of anastomosis in the setting of metastatic carcinoma   P:   Zosyn  Day #9 of 10 Trending leukocyte count & monitoring for fever  ENDOCRINE A:   Hypothyroidism  DM  II   P:   Synthroid 17mcg IV daily Moderate dose SSI per algorithm Lantus 17u QHS Hold home glipizide, actos TPN rate 100cc/hr  NEUROLOGIC A:   Acute Encephalopathy - Post op small bowel resection. Resolving. P:   RASS goal: 0 PT/OT Consult post extubation  FAMILY  - Updates:  No family at bedside am 6/5  - Inter-disciplinary family meet or Palliative Care meeting due by:  6/3  Independent CC time 53 minutes   Baltazar Apo, MD, PhD 07/27/2015, 9:48 AM [facility] [phone] or if no answer [phone]

## 2015-07-27 NOTE — Progress Notes (Signed)
[facility] ADULT ICU REPLACEMENT PROTOCOL FOR AM LAB REPLACEMENT ONLY  The patient does not apply for the [facility] Adult ICU Electrolyte Replacment Protocol based on the criteria listed below:    Is GFR >/= 40 ml/min? No.  Patient's GFR today is 24   Abnormal electrolyte(s):K3.1   If a panic level lab has been reported, has the CCM MD in charge been notified? Yes.  .   Physician:  Corrie Dandy, MD  Vear Clock 07/27/2015 6:10 AM

## 2015-07-27 NOTE — Progress Notes (Signed)
PARENTERAL NUTRITION CONSULT NOTE - Follow Up  Pharmacy Consult:  TPN Indication: Prolonged ileus  No Known Allergies  Patient Measurements: Height: 5' 5"  (165.1 cm) Weight: 189 lb 9.5 oz (86 kg) IBW/kg (Calculated) : 57 Adjusted Body Weight: 74 kg BMI = 31.5 Usual Weight: 76kg preop on 5/28  Vital Signs: Temp: 98.5 F (36.9 C) (06/05 0345) Temp Source: Oral (06/05 0345) BP: 137/64 mmHg (06/05 0500) Pulse Rate: 74 (06/05 0529)   Insulin Requirements in the past 24 hours:  20 units moderate SSI + 60 units of regular insulin in TPN +  Lantus 17 units daily   Assessment:  42 YOM with 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  She returned on 07/19/15 with abdominal pain and CT showed free air and fluid.  She underwent emergent ex-lap with resection of anastomosis and application of wound VAC on 07/19/15.  She then had re-exploration of SB anastomosis and abdominal wall closure on 07/21/15.  Pharmacy managing TPN for expected prolonged ileus.  She has had poor PO intake during the last admission.   GI: wound VAC removed on 5/30.  NG O/P 1051m, stool x3.  Bilious drainage 6/5 and Surgery suspects EC fistula > need CT scan.  Prealbumin 2.5 > 11.1.  Endo: hypothyroidism on IV Synthroid.  DM - CBGs uncontrolled on SSI + insulin in TPN + Lantus, off Decadron Lytes: no lytes in TPN on 6/4 - K+ 3.1 (3 runs ordered, Lasix yesterday), CoCa high normal at 10.42 (Ca x Phos = 47.9, goal < 55) Renal: AoC kidney disease - SCr up 1.98, CrCL 29 ml/min, BUN up to 72 - good UOP 1.4 ml/kg/hr, NS at [place] intubated, FiO2 40% - now with +cuff leak but extubated.  Decadron to help with inflammation. Cards: HTN, new Afib with RVR - VSS - amiodarone gtt, PRN Lasix Hepatobil: alk phos elevated, others WNL.  TG WNL. Heme/Onc: hx breast cancer - hgb low at 7.7, plts WNL Neuro: PRN Fentanyl - GCS 13, CPOT 0-4 ID: Zosyn D#9/10 for peritonitis - afebrile, WBC up 18.3 (on  steroid), no cultures Best Practices: heparin SQ, MC, Pepcid TPN Access: PICC triple lumen placed 07/22/15 TPN start date: 07/20/15 >>  Current Nutrition:  TPN  Nutritional Goals: Re-estimated needs post extubation per RD:  2000-2200 kCal and 120-130gm protein per day   Plan:  - Clinimix E 5/15 at 100 ml/hr + start lipids at 10 ml/hr.  TPN will provide 2184 kCal and 120gm of protein per day, meeting 100% of patient's needs.  Add back electrolytes today and watch Ca x Phos trend.  May need to alternate Clinimix with and without electrolytes. - Monitor BUN and may need to reduce protein provision in TPN, currently providing 1.6 g/kg/d using adjust body weight. - Daily multivitamin and trace elements in TPN - Pepcid 230mdaily in TPN (d/c bolus) - Continue with 60 units regular insulin in TPN + moderate SSI Q4H.  Watch CBGs as patient is off Decadron. - Mag sulfate 2gm IV x 1  - F/U EC fistula diagnosis and may add Vit C, Zinc to help with wound healing - F/U AM labs   Rigoberto Repass D. DaMina MarblePharmD, BCPS Pager:  [phone]/06/2015, 11:09 AM

## 2015-07-27 NOTE — Procedures (Signed)
Extubation Procedure Note  Patient Details:   Name: Monica Morrison DOB: Feb 29, 1945 MRN: [id number]   Airway Documentation:     Evaluation  O2 sats: stable throughout Complications: No apparent complications Patient did tolerate procedure well. Bilateral Breath Sounds: Clear, Diminished   Yes  4l/min [place] Cuff leak positive  Revonda Standard 07/27/2015, 10:09 AM

## 2015-07-27 NOTE — Care Management Important Message (Signed)
Important Message  Patient Details  Name: Monica Morrison MRN: [id number] Date of Birth: October 10, 1945   Medicare Important Message Given:  Yes    Nathen May 07/27/2015, 11:49 AM

## 2015-07-27 NOTE — Progress Notes (Signed)
RT note- cuff leak is positive.

## 2015-07-27 NOTE — Progress Notes (Signed)
6 Days Post-Op  Subjective: Pt awake and follows commands  On TNA   Objective: Vital signs in last 24 hours: Temp:  [98.1 F (36.7 C)-98.5 F (36.9 C)] 98.5 F (36.9 C) (06/05 0345) Pulse Rate:  [67-92] 74 (06/05 0529) Resp:  [13-27] 16 (06/05 0529) BP: (109-145)/(53-80) 137/64 mmHg (06/05 0500) SpO2:  [100 %] 100 % (06/05 0529) FiO2 (%):  [40 %] 40 % (06/05 0400) Last BM Date: 07/26/15  Intake/Output from previous day: 06/04 0701 - 06/05 0700 In: 3057.4 [I.V.:587.4; NG/GT:120; IV Piggyback:150; TPN:2200] Out: 4010 [Urine:2960; Emesis/NG output:1050] Intake/Output this shift:    Incision/Wound:open wound necrotic base with bilious drainage noted fascia intact   Lab Results:   Recent Labs  07/26/15 0345 07/27/15 0419  WBC 16.7* 18.3*  HGB 8.4* 7.7*  HCT 27.4* 24.8*  PLT 328 378   BMET  Recent Labs  07/26/15 0345 07/27/15 0419  NA 138  137 136  K 4.1  4.1 3.1*  CL 106  105 103  CO2 22  22 24   GLUCOSE 155*  156* 164*  BUN 56*  57* 72*  CREATININE 1.78*  1.67* 1.98*  CALCIUM 8.2*  8.2* 8.1*   PT/INR No results for input(s): LABPROT, INR in the last 72 hours. ABG No results for input(s): PHART, HCO3 in the last 72 hours.  Invalid input(s): PCO2, PO2  Studies/Results: Dg Chest Port 1 View  07/27/2015  CLINICAL DATA:  Respiratory failure EXAM: PORTABLE CHEST 1 VIEW COMPARISON:  07/26/2015 FINDINGS: Support devices are stable. Heart is normal size. Mitral valve calcifications present. No confluent airspace opacity or effusion. IMPRESSION: No acute cardiopulmonary disease. Electronically Signed   By: Rolm Baptise M.D.   On: 07/27/2015 07:23   Dg Chest Port 1 View  07/26/2015  CLINICAL DATA:  Acute respiratory failure EXAM: PORTABLE CHEST 1 VIEW COMPARISON:  07/24/2015 FINDINGS: Support devices are unchanged. Heart is normal size. Slight elevation of the right hemidiaphragm with right base atelectasis. No confluent opacity on the left. No effusions.  IMPRESSION: Mild right base atelectasis, stable. Electronically Signed   By: Rolm Baptise M.D.   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 07/27/2015

## 2015-07-27 NOTE — Progress Notes (Signed)
CT shows EC fistula Discussed with the patient and recommend surgical exploration She is refusing any more surgery at this time Will manage with NPO and TNA for now since she is not systemically ill Will need to be seen by palliative care for end of life discussion.

## 2015-07-28 DIAGNOSIS — Z515 Encounter for palliative care: Secondary | ICD-10-CM | POA: Insufficient documentation

## 2015-07-28 LAB — BASIC METABOLIC PANEL
ANION GAP: 14 (ref 5–15)
BUN: 77 mg/dL — ABNORMAL HIGH (ref 6–20)
CALCIUM: 8.4 mg/dL — AB (ref 8.9–10.3)
CO2: 23 mmol/L (ref 22–32)
Chloride: 102 mmol/L (ref 101–111)
Creatinine, Ser: 2.02 mg/dL — ABNORMAL HIGH (ref 0.44–1.00)
GFR calc Af Amer: 28 mL/min — ABNORMAL LOW (ref >60)
GFR, EST NON AFRICAN AMERICAN: 24 mL/min — AB (ref >60)
GLUCOSE: 105 mg/dL — AB (ref 65–99)
Potassium: 3.2 mmol/L — ABNORMAL LOW (ref 3.5–5.1)
Sodium: 139 mmol/L (ref 135–145)

## 2015-07-28 LAB — GLUCOSE, CAPILLARY
GLUCOSE-CAPILLARY: 107 mg/dL — AB (ref 65–99)
GLUCOSE-CAPILLARY: 92 mg/dL (ref 65–99)
GLUCOSE-CAPILLARY: 86 mg/dL (ref 65–99)
GLUCOSE-CAPILLARY: 145 mg/dL — AB (ref 65–99)
Glucose-Capillary: 94 mg/dL (ref 65–99)
Glucose-Capillary: 35 mg/dL — CL (ref 65–99)

## 2015-07-28 LAB — PHOSPHORUS: PHOSPHORUS: 4.7 mg/dL — AB (ref 2.5–4.6)

## 2015-07-28 LAB — ALBUMIN: Albumin: 1.2 g/dL — ABNORMAL LOW (ref 3.5–5.0)

## 2015-07-28 LAB — MAGNESIUM: MAGNESIUM: 2.3 mg/dL (ref 1.7–2.4)

## 2015-07-28 MED ORDER — ALTEPLASE 2 MG IJ SOLR: 2.0000 mg | Freq: Once | INTRAMUSCULAR | Status: DC

## 2015-07-28 MED ORDER — DEXTROSE 50 % IV SOLN
INTRAVENOUS | Status: AC
  Administered 2015-07-28: 50 mL
  Filled 2015-07-28: qty 50

## 2015-07-28 MED ORDER — POTASSIUM CHLORIDE 10 MEQ/50ML IV SOLN
10.0000 meq | INTRAVENOUS | Status: DC
  Administered 2015-07-28: 10 meq via INTRAVENOUS
  Filled 2015-07-28 (×2): qty 50

## 2015-07-28 MED ORDER — DEXTROSE-NACL 5-0.9 % IV SOLN
INTRAVENOUS | Status: DC
  Administered 2015-07-28 – 2015-07-29 (×2): via INTRAVENOUS

## 2015-07-28 MED ORDER — FENTANYL BOLUS VIA INFUSION
25.0000 ug | INTRAVENOUS | Status: DC | PRN
  Administered 2015-07-29 – 2015-07-30 (×6): 50 ug via INTRAVENOUS
  Filled 2015-07-28: qty 50

## 2015-07-28 MED ORDER — LORAZEPAM 2 MG/ML IJ SOLN
0.5000 mg | INTRAMUSCULAR | Status: DC | PRN
  Administered 2015-07-30: 1 mg via INTRAVENOUS
  Filled 2015-07-28: qty 1

## 2015-07-28 MED ORDER — NYSTATIN 100000 UNIT/GM EX POWD
Freq: Three times a day (TID) | CUTANEOUS | Status: DC
  Administered 2015-07-28: 1 via TOPICAL
  Administered 2015-07-28 (×2): via TOPICAL
  Administered 2015-07-29: 1 g via TOPICAL
  Administered 2015-07-30: 14:00:00 via TOPICAL
  Filled 2015-07-28: qty 15

## 2015-07-28 MED ORDER — FENTANYL CITRATE (PF) 2500 MCG/50ML IJ SOLN
10.0000 ug/h | INTRAMUSCULAR | Status: DC
  Administered 2015-07-28: 10 ug/h via INTRAVENOUS
  Filled 2015-07-28: qty 50

## 2015-07-28 MED ORDER — POTASSIUM CHLORIDE 10 MEQ/50ML IV SOLN: 10.0000 meq | INTRAVENOUS | Status: DC

## 2015-07-28 MED ORDER — INSULIN GLARGINE 100 UNIT/ML ~~LOC~~ SOLN
10.0000 [IU] | Freq: Every day | SUBCUTANEOUS | Status: DC
  Filled 2015-07-28: qty 0.1

## 2015-07-28 MED ORDER — POTASSIUM CHLORIDE 10 MEQ/100ML IV SOLN
10.0000 meq | INTRAVENOUS | Status: AC
  Administered 2015-07-28 (×3): 10 meq via INTRAVENOUS
  Filled 2015-07-28 (×3): qty 100

## 2015-07-28 MED ORDER — TRACE MINERALS CR-CU-MN-SE-ZN 10-1000-500-60 MCG/ML IV SOLN
INTRAVENOUS | Status: DC
  Filled 2015-07-28: qty 2400

## 2015-07-28 MED ORDER — FENTANYL CITRATE (PF) 100 MCG/2ML IJ SOLN
50.0000 ug | INTRAMUSCULAR | Status: DC | PRN
  Administered 2015-07-28: 50 ug via INTRAVENOUS
  Filled 2015-07-28: qty 2

## 2015-07-28 MED ORDER — FAT EMULSION 20 % IV EMUL
240.0000 mL | INTRAVENOUS | Status: DC
  Filled 2015-07-28: qty 250

## 2015-07-28 NOTE — Progress Notes (Signed)
PT Cancellation Note  Patient Details Name: Monica Morrison MRN: [id number] DOB: 1945-06-25   Cancelled Treatment:    Reason Eval/Treat Not Completed: Other (comment). Spoke with Will Creig Hines pt now with fistula and palliative medicine to see. Will await outcome of that meeting.   Reather Steller 07/28/2015, 10:12 AM Suanne Marker PT [phone]

## 2015-07-28 NOTE — Progress Notes (Signed)
Hypoglycemic Event  CBG: 36  Treatment: D50 IV 50 mL  Symptoms: Sweaty  Follow-up CBG: Time: 2003 CBG Result:145  Possible Reasons for Event: Medication regimen: D/C'd TPN, maintenance fluids do not contain dextrose.  Comments/MD notified: eLink notified, requesting maintenance fluids w/ dextrose.  Will continue to monitor closely.   Sherlie Ban

## 2015-07-28 NOTE — Progress Notes (Signed)
PARENTERAL NUTRITION CONSULT NOTE - Follow Up  Pharmacy Consult:  TPN Indication: Prolonged ileus + new EC fistula  No Known Allergies  Patient Measurements: Height: 5' 5"  (165.1 cm) Weight: 189 lb 9.5 oz (86 kg) IBW/kg (Calculated) : 57 Adjusted Body Weight: 74 kg BMI = 31.5 Usual Weight: 76kg preop on 5/28  Vital Signs: Temp: 97.4 F (36.3 C) (06/06 0359) Temp Source: Oral (06/06 0359) BP: 150/73 mmHg (06/06 0500) Pulse Rate: 98 (06/06 0500)   Insulin Requirements in the past 24 hours:  20 units moderate SSI + 60 units of regular insulin in TPN +  Lantus 17 units daily   Assessment:  24 YOM with 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  She returned on 07/19/15 with abdominal pain and CT showed free air and fluid.  She underwent emergent ex-lap with resection of anastomosis and application of wound VAC on 07/19/15.  She then had re-exploration of SB anastomosis and abdominal wall closure on 07/21/15.  Pharmacy managing TPN for expected prolonged ileus.  She has had poor PO intake during the last admission.   GI: wound VAC removed on 5/30.  Bilious drainage 6/5 > CT scan showed EC fistula, ascites and mesenteric edema > patient refusing anymore surgeries.  NG O/P reduced to 684m, stool x2.  Prealbumin 2.5 > 11.1.  Endo: hypothyroidism on IV Synthroid.  DM on Actos PTA - CBGs well controlled since off Decadron, on SSI + insulin in TPN + Lantus Lytes: no lytes in TPN on 6/4 - K+ 3.2 (2 runs ordered), Phos 4.7, CoCa high at 10.64 (Ca x Phos = 50, goal < 55) Renal: AoC kidney disease - SCr up 2.02, CrCL 28 ml/min, BUN up to 77 - good UOP 1.7 ml/kg/hr[facility]  [facility] shows mild left hydronephrosis Pulm: s/p Decadron to reduce inflammation, extubated 6/5 to 3L [place] Cards: HTN, new Afib with RVR - BP/HR trending up - amiodarone gtt, intermittent Lasix Hepatobil: alk phos elevated, others WNL.  TG WNL. Heme/Onc: hx breast cancer - hgb low at 7.7, plts  WNL Neuro: PRN Fentanyl - GCS 13, CPOT 0-4 ID: Zosyn D#10/10 for peritonitis - afebrile, WBC up 18.3 (on steroid), no cultures Best Practices: heparin SQ, MC, Pepcid TPN Access: PICC triple lumen placed 07/22/15 TPN start date: 07/20/15 >>  Current Nutrition:  TPN  Nutritional Goals: Re-estimated needs post extubation per RD:  2000-2200 kCal and 120-130gm protein per day   Plan:  - Continue Clinimix 5/15 at 100 ml/hr (remove lytes today d/t concern of Ca-Phos precipitation) and continue lipids at 10 ml/hr.  TPN provides 2184 kCal and 120gm of protein per day, meeting 100% of patient's needs.  Likely need to alternate Clinimix with and without electrolytes. - Monitor BUN and may need to reduce protein provision in TPN, currently providing 1.6 g/kg/d using adjust body weight. - Daily multivitamin and trace elements in TPN - Pepcid 221mdaily in TPN - KCL x3 additional runs (no electrolyte in TPN today) - Continue regular insulin in TPN at 60 units + moderate SSI Q4H + reduce Lantus to 10 units SQ QHS - Cycle TPN once insulin adjustment is adequate.  Would prefer to have insulin in TPN and taper Lantus off to simplify regimen for the outpatient setting.   Lugenia Assefa D. DaMina MarblePharmD, BCPS Pager:  [phone] 4492/07/2015, 7:38 AM

## 2015-07-28 NOTE — Progress Notes (Signed)
[facility] Progress Note Patient Name: Monica Morrison DOB: April 29, 1945 MRN: [id number]   Date of Service  07/28/2015  HPI/Events of Note    eICU Interventions       Intervention Category Intermediate Interventions: Electrolyte abnormality - evaluation and management  Raylene Miyamoto. 07/28/2015, 6:30 AM

## 2015-07-28 NOTE — Consult Note (Signed)
Consultation Note Date: 07/28/2015   Patient Name: Monica Morrison  DOB: 1945/12/01  MRN: [id number]  Age / Sex: 70 y.o., female  PCP: Antony Contras, MD Referring Physician: Nolon Nations, MD  Reason for Consultation: Establishing goals of care, Non pain symptom management, Pain control, Psychosocial/spiritual support and Terminal Care  HPI/Patient Profile: 70 y.o. 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 She underwent an exploratory lap.  07/19/2015 with severe abdominal pain. Per CT scan she had free air and was emergently taken to the OR. She developed complications that required ventilation post surgery. 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 Patient shares with her family as well as her surgeon that she wants no further surgeries.   Clinical Assessment and Goals of Care: Patient is somnolent but she readily awakens to voice  especially her family's. When I first introduce myself the first thing she says to me is  "no surgeries". She has told her sons that she does not wish to pursue any more treatment or surgeries. I did meet with both of her sons as well as her sister for goals of care discussion.  NEXT OF KIN sons, Christean Grief    SUMMARY OF RECOMMENDATIONS   DNR/DNI Stop TPN Continue IV fluids for now Continue antibiotics for now Leave NG tube in place Start fentanyl drip at 10 g an hour and 25-50 g every 30 minutes when necessary for pain or dyspnea Transfer to 6 N. out of ICU Code Status/Advance Care Planning:  DNR    Symptom Management:   Pain: Start fentanyl continuous infusion at 10 g an hour and 25-50 g every 30 minutes  when necessary monitor and titrate for effect  Secretions: We'll DC TPN and continue IV fluids at low dose of 50 and hour for now. Will add Robinul 0.2-0.4 mg every 4 hour when necessary for secretions  Anxiety: We'll leave Ativan 0.5-1 mg every 4 when necessary  Palliative Prophylaxis:   Aspiration, Delirium Protocol, Frequent Pain Assessment, Oral Care and Turn Reposition  Additional Recommendations (Limitations, Scope, Preferences):  Minimize Medications, No Artificial Feeding, No Blood Transfusions, No Chemotherapy, No Diagnostics, No Hemodialysis, No Radiation, No Surgical Procedures and No Tracheostomy  Psycho-social/Spiritual:   Desire for further Chaplaincy support:no  Additional Recommendations: Grief/Bereavement Support  Prognosis:   < 2 weeks in the setting of 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 Patient do not does not want any further surgeries  Discharge Planning: Anticipated Hospital Death      Primary Diagnoses: Present on Admission:  . Perforated bowel ([place]) . Peritonitis ([place])  I have reviewed the medical record, interviewed the patient and family, and examined the patient. The following aspects are pertinent.  Past Medical History  Diagnosis Date  . Cancer ([place])  breast  . Hypertension   . Diabetes mellitus   . Arthritis   . Breast cancer ([place])   . CKD (chronic kidney disease)   . Hypothyroidism 06/22/2015   Social History   Social History  . Marital Status: Divorced    Spouse Name: N/A  . Number of Children: N/A  . Years of Education: N/A   Social History Main Topics  . Smoking status: Never Smoker   . Smokeless tobacco: None  . Alcohol Use: No  . Drug Use: No  . Sexual Activity: Yes   Other Topics Concern  . None   Social History Narrative   Family History  Problem Relation Age of Onset  . Diabetes Mother   . Hypertension Mother   . Diabetes Sister   . Hypertension  Sister    Scheduled Meds: . alteplase  2 mg Intracatheter Once  . antiseptic oral rinse  7 mL Mouth Rinse q12n4p  . chlorhexidine  15 mL Mouth Rinse BID  . heparin subcutaneous  5,000 Units Subcutaneous Q8H  . insulin aspart  0-15 Units Subcutaneous Q4H  . levothyroxine  56 mcg Intravenous Daily  . nystatin   Topical TID  . piperacillin-tazobactam (ZOSYN)  IV  3.375 g Intravenous Q8H  . sodium chloride flush  10-40 mL Intracatheter Q12H   Continuous Infusions: . sodium chloride 50 mL/hr (07/28/15 1527)  . amiodarone 30 mg/hr (07/28/15 1028)  . fentaNYL infusion INTRAVENOUS 10 mcg/hr (07/28/15 1542)   PRN Meds:.fentaNYL, fentaNYL (SUBLIMAZE) injection, Gerhardt's butt cream, LORazepam, [DISCONTINUED] ondansetron **OR** ondansetron (ZOFRAN) IV, sodium chloride flush Medications Prior to Admission:  Prior to Admission medications   Medication Sig Start Date End Date Taking? Authorizing Provider  alendronate (FOSAMAX) 70 MG tablet Take 70 mg by mouth every 7 (seven) days. On Sunday   Yes Historical Provider, MD  ALPRAZolam (XANAX) 0.25 MG tablet Take 1 tablet (0.25 mg total) by mouth at bedtime as needed. Patient taking differently: Take 0.25 mg by mouth at bedtime as needed for anxiety or sleep.  07/15/15  Yes Emina Riebock, NP  atorvastatin (LIPITOR) 20 MG tablet Take 20 mg by mouth daily.   Yes Historical Provider, MD  bismuth subsalicylate (PEPTO BISMOL) 262 MG/15ML suspension Take 15 mLs by mouth 2 (two) times daily as needed for diarrhea or loose stools.   Yes Historical Provider, MD  Calcium Carbonate (CALCIUM 500 PO) Take 500 mg by mouth daily.    Yes Historical Provider, MD  escitalopram (LEXAPRO) 10 MG tablet Take 10 mg by mouth daily.  01/06/15  Yes Historical Provider, MD  levothyroxine (SYNTHROID, LEVOTHROID) 112 MCG tablet Take 112 mcg by mouth daily before breakfast.    Yes Historical Provider, MD  metoprolol tartrate (LOPRESSOR) 25 MG tablet Take 1 tablet (25 mg total) by  mouth 2 (two) times daily. 06/22/15  Yes Earnstine Regal, PA-C  Multiple Vitamins-Minerals (CERTAVITE/ANTIOXIDANTS) TABS Take 1 tablet by mouth daily.   Yes Historical Provider, MD  omeprazole (PRILOSEC OTC) 20 MG tablet Take 20 mg by mouth daily.   Yes Historical Provider, MD  pioglitazone (ACTOS) 15 MG tablet Take 15 mg by mouth daily.     Yes Historical Provider, MD  Protein (PROCEL 100) POWD Take 1 scoop by mouth 2 (two) times daily.   Yes Historical Provider, MD  Psyllium (METAMUCIL PO) Take 1 packet by mouth daily.   Yes Historical Provider, MD  saccharomyces boulardii (FLORASTOR) 250 MG capsule Take 250 mg by mouth daily. Stop date 07/31/15   Yes  Historical Provider, MD  ONE TOUCH ULTRA TEST test strip  01/03/13   Historical Provider, MD   No Known Allergies Review of Systems  Unable to perform ROS: Acuity of condition    Physical Exam  Constitutional: She appears well-developed.  HENT:  Head: Normocephalic and atraumatic.  Cardiovascular: Normal rate.   Pulmonary/Chest: Effort normal.  Abdominal:  Mid line abdominal incision  Genitourinary:  foley  Neurological:  somnolent but awakens easily to voice  Skin: Skin is warm.  Nursing note and vitals reviewed.   Vital Signs: BP 128/72 mmHg  Pulse 94  Temp(Src) 98.6 F (37 C) (Axillary)  Resp 24  Ht 5\' 5"  (1.651 m)  Wt 86 kg (189 lb 9.5 oz)  BMI 31.55 kg/m2  SpO2 100% Pain Assessment: 0-10 POSS *See Group Information*: 2-Acceptable,Slightly drowsy, easily aroused Pain Score: 0-No pain   SpO2: SpO2: 100 % O2 Device:SpO2: 100 % O2 Flow Rate: .O2 Flow Rate (L/min): 3 L/min  IO: Intake/output summary:  Intake/Output Summary (Last 24 hours) at 07/28/15 1620 Last data filed at 07/28/15 1000  Gross per 24 hour  Intake 2561.93 ml  Output   3346 ml  Net -784.07 ml    LBM: Last BM Date: 07/28/15 Baseline Weight: Weight: 75.751 kg (167 lb) Most recent weight: Weight: 86 kg (189 lb 9.5 oz)     Palliative  Assessment/Data:   Flowsheet Rows        Most Recent Value   Intake Tab    Referral Department  Hospitalist   Unit at Time of Referral  ICU   Palliative Care Primary Diagnosis  Sepsis/Infectious Disease   Date Notified  07/28/15   Palliative Care Type  New Palliative care   Reason for referral  Non-pain Symptom, Pain, Clarify Goals of Care, Counsel Regarding Hospice, End of Life Care Assistance   Date of Admission  07/19/15   Date first seen by Palliative Care  07/28/15   # of days Palliative referral response time  0 Day(s)   # of days IP prior to Palliative referral  9   Clinical Assessment    Palliative Performance Scale Score  30%   Pain Max last 24 hours  Not able to report   Pain Min Last 24 hours  Not able to report   Dyspnea Max Last 24 Hours  Not able to report   Dyspnea Min Last 24 hours  Not able to report   Nausea Max Last 24 Hours  Not able to report   Nausea Min Last 24 Hours  Not able to report   Anxiety Max Last 24 Hours  Not able to report   Anxiety Min Last 24 Hours  Not able to report   Other Max Last 24 Hours  Not able to report   Psychosocial & Spiritual Assessment    Palliative Care Outcomes    Patient/Family meeting held?  Yes   Who was at the meeting?  both sons, and sister   Palliative Care Outcomes  Improved pain interventions, Improved non-pain symptom therapy, Clarified goals of care, Provided end of life care assistance, Counseled regarding hospice, Provided psychosocial or spiritual support   Patient/Family wishes: Interventions discontinued/not started   Mechanical Ventilation, Tube feedings/TPN, NIPPV, BiPAP, Hemodialysis, Trach, PEG, Transfusion, Vasopressors, Transfer out of ICU   Palliative Care follow-up planned  Yes, Facility      Time In: 1430 Time Out: 1600 Time Total: 90 min Greater than 50%  of this time was spent counseling and coordinating care  related to the above assessment and plan.  Signed by: Dory Horn, NP   Please  contact Palliative Medicine Team phone at [phone] for questions and concerns.  For individual provider: See Shea Evans

## 2015-07-28 NOTE — Progress Notes (Signed)
PULMONARY / CRITICAL CARE MEDICINE   Name: Monica Morrison MRN: [id number] DOB: 07-23-1945    ADMISSION DATE:  07/19/2015 CONSULTATION DATE:  07/19/15  REFERRING MD:  Dr. Brantley Stage   CHIEF COMPLAINT:  Malignant Small Bowel Obstruction  HISTORY OF PRESENT ILLNESS:  [name] with PMH of HTN, DM II, CKD, HTN, arthritis, hypothyroidism, breast cancer and recent admission from 5/19-5/24 for a small bowel obstruction that was found to be metastatic carcinoma s/p exploratory lap / small bowel resection who presented to [facility] on 5/28 with complaints of severe abdominal pain.    The patient was seen in the ER at [facility] for evaluation of abdominal pain.  Initial CT of the abdomen showed free air, a large quantity of free fluid and oral contrast.  Labs - Na 140, K 3.3, Hgb 13.3 / Hct 39, ionized calcium 1.0.  She was transferred to [facility] for operative evaluation. The patient was taken urgently to the OR for exploratory laparotomy with small bowel anastomosis resection.  She returned to the ICU post-operatively on mechanical ventilation.  Post-operatively, she was noted to have runs of SVT & a right mainstem intubation.  Repeat labs pending.    SUBJECTIVE:  NAD extubated  VITAL SIGNS: BP 134/72 mmHg  Pulse 96  Temp(Src) 97.4 F (36.3 C) (Oral)  Resp 19  Ht 5\' 5"  (1.651 m)  Wt 189 lb 9.5 oz (86 kg)  BMI 31.55 kg/m2  SpO2 100%  HEMODYNAMICS:    VENTILATOR SETTINGS:    INTAKE / OUTPUT: I/O last 3 completed shifts: In: 6172.5 [I.V.:961.2; NG/GT:1030; IV Piggyback:450] Out: [id number] [Urine:4940; Emesis/NG output:1100]  PHYSICAL EXAMINATION: General:  Eyes open. No distress. Tracks and nods, follows commands Neuro:  Following commands. Moving all 4 extremities. Nods to questions. HEENT: Extubated No scleral icterus or injection. Cardiovascular:  No edema. Sinus rhythm on telemetry. Regular rate. Lungs:  Clear on auscultation bilaterally. Decreased in bases Abdomen:  Soft. Hypoactive  bowel sounds. Integument:  Warm & dry. No rash on exposed skin. Abdominal dressing clean & dry.   LABS:  BMET  Recent Labs Lab 07/26/15 0345 07/27/15 0419 07/28/15 0420  NA 138  137 136 139  K 4.1  4.1 3.1* 3.2*  CL 106  105 103 102  CO2 22  22 24 23   BUN 56*  57* 72* 77*  CREATININE 1.78*  1.67* 1.98* 2.02*  GLUCOSE 155*  156* 164* 105*    Electrolytes  Recent Labs Lab 07/26/15 0345 07/27/15 0419 07/28/15 0420  CALCIUM 8.2*  8.2* 8.1* 8.4*  MG 2.1 1.8 2.3  PHOS 5.1* 4.6 4.7*    CBC  Recent Labs Lab 07/24/15 0510 07/26/15 0345 07/27/15 0419  WBC 12.2* 16.7* 18.3*  HGB 8.4* 8.4* 7.7*  HCT 26.7* 27.4* 24.8*  PLT 257 328 378    Coag's No results for input(s): APTT, INR in the last 168 hours.  Sepsis Markers No results for input(s): LATICACIDVEN, PROCALCITON, O2SATVEN in the last 168 hours.  ABG No results for input(s): PHART, PCO2ART, PO2ART in the last 168 hours.  Liver Enzymes  Recent Labs Lab 07/23/15 0440 07/24/15 0510  07/26/15 0345 07/27/15 0419 07/28/15 0420  AST 25 21  --   --  22  --   ALT 48 35  --   --  24  --   ALKPHOS 216* 198*  --   --  193*  --   BILITOT <0.1* 0.3  --   --  0.3  --  ALBUMIN 1.1* 1.1*  < > 1.0* 1.1* 1.2*  < > = values in this interval not displayed.  Cardiac Enzymes No results for input(s): TROPONINI, PROBNP in the last 168 hours.  Glucose  Recent Labs Lab 07/27/15 0825 07/27/15 1143 07/27/15 1532 07/27/15 1948 07/27/15 2329 07/28/15 0358  GLUCAP 173* 159* 147* 133* 130* 107*    Imaging Ct Abdomen Pelvis Wo Contrast  07/27/2015  CLINICAL DATA:  Recent surgery for bowel perforation, drainage from the wound EXAM: CT ABDOMEN AND PELVIS WITHOUT CONTRAST TECHNIQUE: Multidetector CT imaging of the abdomen and pelvis was performed following the standard protocol without IV contrast. COMPARISON:  07/19/2015 FINDINGS: The study is limited without IV contrast. Small hiatal hernia is noted. Bilateral  small pleural effusion. Mitral valve calcifications are noted. Unenhanced liver shows no biliary ductal dilatation. Small perihepatic ascites. Mild distended gallbladder. Small layering calcified gallstones are noted within gallbladder. Unenhanced pancreas is unremarkable. Unenhanced spleen is unremarkable. Oral contrast material was given to the patient. No gastric outlet obstruction. There is a midline lower abdominal open wound. Contrast material is noted within the open wound. There are postsurgical changes in mid lower small bowel. Axial image 49 there is extravasation of the contrast material from small bowel in mid anterior small bowel mesentery and probable there is a fistulous tract into the open abdominal wound. Please see sagittal image 80. There is diffuse thickening of small bowel wall highly suspicious for diffuse enteritis. There is mesenteric edema and small free fluid noted within lower mesentery bilaterally. No evidence of small bowel obstruction. There is contrast material noted in descending colon and sigmoid colon. Few sigmoid colon diverticula. There is thickening of distal sigmoid colon and rectal wall. Findings highly suspicious for distal colitis. Thickening of urinary bladder wall. Cystitis cannot be excluded. The urinary bladder is decompressed. Probable Foley catheter within urinary bladder. There is anasarca infiltration of subcutaneous fat bilateral pelvic and lower abdominal wall. Again noted chronic right hydronephrosis. Study is limited without IV contrast. No ureteral calcified calculi are noted. Mild left hydronephrosis. The patient is status post hysterectomy. Degenerative changes are noted thoracolumbar spine IMPRESSION: 1. There is oral contrast material in open abdominal wound anterior abdomen. There is contrast extravasation from small bowel at the anastomosis site in mid anterior abdomen under abdominal fascia supraumbilical region please see sagittal image 80. See axial  image 48. There is probable fistulous tract connecting with the abdominal wound. Findings highly suspicious for enterocutaneous fistula. 2. No evidence of small bowel obstruction. There is diffuse thickening of small bowel wall highly suspicious for diffuse enteritis. 3. Contrast material is noted within distal colon and rectum. No evidence of distal colonic obstruction. There is thickening of distal sigmoid colon wall and rectum. Colitis or inflammatory process cannot be excluded. Clinical correlation is necessary. 4. Thickening of urinary bladder wall. Satellite cystitis cannot be excluded. 5. Again noted chronic mild right hydronephrosis. Mild left hydronephrosis. 6. Bilateral small pleural effusion with bilateral basilar posterior atelectasis. 7. Small perihepatic ascites. Small ascites and mesenteric edema in lower abdomen and pelvis. These results were called by telephone at the time of interpretation on 07/27/2015 at 2:18 pm to Dr. Erroll Luna , who verbally acknowledged these results. Electronically Signed   By: Lahoma Crocker M.D.   On: 07/27/2015 14:18     STUDIES:  CT ABD/Pelvis 5/28 (OSH): bowel perforation with large volume free air, free fluid, extraluminal enteric contrast in the pelvis, known intra-abdominal metastatic disease, bilateral adrenal masses, liver lesion on  prior exam is not as well visualized, cholelithiasis with gallbladder distention.    PORT CXR 5/29:  Endotracheal tube in good position. R IJ CVL in right atrium. NGT coursing below stomach. EKG 5/29:  A fib w/ RVR. PORT CXR 5/30:  R IJ CVL in SVC. ETT in good position. PORT CXR 6/2:  R PICC w/ tip in atrium. ETT in good position. Low lung volumes with minimal atelectasis.  MICROBIOLOGY: MRSA PCR 5/28:  Negative   ANTIBIOTICS: Diflucan 5/28 (perf) - 5/31 Zosyn 5/28 >> 6/6  SIGNIFICANT EVENTS: 5/28  Re-Admit with abd pain, found to have perforation of anastomosis. Returned to ICU on vent post-op  LINES/TUBES: L FEM  ART LINE 5/28 - 6/1 R IJ CVC 5/28 - 6/1 ETT 5/28 >> 6/5 NGT R NARE 5/28 >> FOLEY 5/28 >> R PICC 5/30>>>  ASSESSMENT / PLAN:  PULMONARY A: Acute Respiratory Failure  P:   Extubated 6/5 Intermittent Lasix, dose daily, none 6/5 or 6/6 given renal fxn Pulmonary toilet with flutter. IS and mobilize   CARDIOVASCULAR A:  A fib w/ RVR Paroxysmal - No h/o A fib. Now SR H/O HTN P:  Continuing amio gtt  Telemetry monitoring Cont hold home lopressor, lipitor   RENAL Lab Results  Component Value Date   CREATININE 2.02* 07/28/2015   CREATININE 1.98* 07/27/2015   CREATININE 1.67* 07/26/2015   CREATININE 1.78* 07/26/2015   CREATININE 1.4* 02/09/2015   CREATININE 1.5* 02/06/2014   CREATININE 1.3* 03/12/2012    Recent Labs Lab 07/26/15 0345 07/27/15 0419 07/28/15 0420  K 4.1  4.1 3.1* 3.2*  A:   Acute Renal Failure - note increase S Cr 6/5 after lasix Metabolic Acidosis - Stable. Hypokalemia - Resolved. Hypomagnesemia - Resolved. P:   Trending electrolytes & renal function Trending UOP with Foley  GASTROINTESTINAL A:   Metastatic Carcinoma with Small Bowel Obstruction  Transaminitis - Resolved. P:   Post Op Mgt per CCS NPO TPN running NGT to remain in place post extubation   HEMATOLOGIC/ONCOLOGIC  Recent Labs  07/26/15 0345 07/27/15 0419  HGB 8.4* 7.7*   A:   Anemia - No signs of active bleeding. H/O Breast Cancer  P:  Monitor cell counts w/ CBC Transfuse per ICU guidelines Heparin [place] q8hr SCDs  INFECTIOUS A:   Peritonitis - s/p perforation of anastomosis in the setting of metastatic carcinoma   P:   Zosyn completed 6/6 Trending leukocyte count & monitoring for fever  ENDOCRINE CBG (last 3)   Recent Labs  07/27/15 1948 07/27/15 2329 07/28/15 0358  GLUCAP 133* 130* 107*    A:   Hypothyroidism  DM II   P:   Synthroid 52mcg IV daily Moderate dose SSI per algorithm Lantus 17u QHS Hold home glipizide, actos TPN rate  100cc/hr  NEUROLOGIC A:   Acute Encephalopathy - Post op small bowel resection. Resolving. P:   RASS goal: 0 PT/OT Consult post extubation  FAMILY  - Updates:  No family at bedside am 6/6   - Inter-disciplinary family meet or Palliative Care meeting due by:  6/3  Note: She does not want to be intubated ever. Therefore needs to be DNR > orders placed   [facility] Minor ACNP Monica Morrison PCCM Pager [phone] till 3 pm If no answer page [phone] 07/28/2015, 8:10 AM  Attending Note:  I have examined patient, reviewed labs, studies and notes. I have discussed the case with S Minor, and I agree with the data and plans as amended above.  Tolerated  extubation. Total body volume overload but diuresis limited by renal fxn. She indicates that she would not want to be reintubated. DNR orders confirmed in chart.  Please call if we can assist further.   Baltazar Apo, MD, PhD 07/28/2015, 12:23 PM [facility] [phone] or if no answer [phone]

## 2015-07-28 NOTE — NC FL2 (Signed)
[place] LEVEL OF CARE SCREENING TOOL     IDENTIFICATION  Patient Name: Monica Morrison Birthdate: 17-Oct-1945 Sex: female Admission Date (Current Location): 07/19/2015  [facility] and [place] Number:  Herbalist and Address:  [facility] [place], [place] [address], [place], [place] [address]      Provider Number: [id number]  Attending Physician Name and Address:  Nolon Nations, MD  Relative Name and Phone Number:  Monica, Morrison [phone]    Current Level of Care: Hospital Recommended Level of Care: [place] Prior Approval Number:    Date Approved/Denied:   PASRR Number: [id number]  Discharge Plan: SNF    Current Diagnoses: Patient Active Problem List   Diagnosis Date Noted  . Perforated bowel ([place]) 07/19/2015  . Peritonitis ([place]) 07/19/2015  . Hypertension 06/30/2015  . Hypokalemia 06/30/2015  . Dehydration 06/30/2015  . Small bowel obstruction ([place]) 06/29/2015  . Hypothyroidism 06/22/2015  . Campylobacter enteritis 06/18/2015  . LFT elevation 06/18/2015  . CKD (chronic kidney disease) stage 3, GFR 30-59 ml/min 06/18/2015  . Acute-on-chronic kidney injury ([place]) 06/18/2015  . Chronic kidney disease 06/17/2015  . Diabetes mellitus with complication ([place])   . Malignant neoplasm of female breast ([place])   . SBO (small bowel obstruction) ([place]) 06/15/2015  . Breast cancer of lower-outer quadrant of left female breast ([place]) 03/07/2012    Orientation RESPIRATION BLADDER Height & Weight     Self, Time, Situation, Place  O2 (3L/Min) Indwelling catheter Weight: 189 lb 9.5 oz (86 kg) Height:  5\' 5"  (165.1 cm)  BEHAVIORAL SYMPTOMS/MOOD NEUROLOGICAL BOWEL NUTRITION STATUS   (none)  (none) Incontinent Diet (TPN)  AMBULATORY STATUS COMMUNICATION OF NEEDS Skin   Extensive Assist Verbally Surgical wounds (Incision Closed: Abdomen moist to dry gauze change twice a day)                       Personal Care Assistance  Level of Assistance  Bathing, Feeding, Dressing Bathing Assistance: Limited assistance Feeding assistance: Independent Dressing Assistance: Limited assistance     Functional Limitations Info  Sight, Hearing, Speech Sight Info: Adequate Hearing Info: Adequate Speech Info: Adequate    SPECIAL CARE FACTORS FREQUENCY  PT (By licensed PT), OT (By licensed OT)     PT Frequency: 5/ week OT Frequency: 5/ week            Contractures Contractures Info: Not present    Additional Factors Info  Insulin Sliding Scale (TPN,  piperacillin-tazobactam (ZOSYN) IVPB 3.375 g Dose: 3.375 g Freq: Every 8 hours Route: IV) Code Status Info: DNR Allergies Info: NKDA   Insulin Sliding Scale Info: insulin aspart (novoLOG) injection 0-15 Units Dose: 0-15 Units Freq: Every 4 hours Route: [place]       Current Medications (07/28/2015):  This is the current hospital active medication list Current Facility-Administered Medications  Medication Dose Route Frequency Provider Last Rate Last Dose  . 0.9 %  sodium chloride infusion   Intravenous Continuous Dory Horn, NP 10 mL/hr at 07/28/15 0800 10 mL/hr at 07/28/15 0800  . alteplase (CATHFLO ACTIVASE) injection 2 mg  2 mg Intracatheter Once Earnstine Regal, PA-C   2 mg at 07/28/15 1115  . amiodarone (NEXTERONE PREMIX) 360-4.14 MG/200ML-% (1.8 mg/mL) IV infusion  30 mg/hr Intravenous Continuous Javier Glazier, MD 16.7 mL/hr at 07/28/15 1028 30 mg/hr at 07/28/15 1028  . antiseptic oral rinse (CPC / CETYLPYRIDINIUM CHLORIDE 0.05%) solution 7 mL  7 mL Mouth Rinse  q12n4p Erroll Luna, MD   7 mL at 07/28/15 1300  . chlorhexidine (PERIDEX) 0.12 % solution 15 mL  15 mL Mouth Rinse BID Erroll Luna, MD   15 mL at 07/28/15 1049  . fentaNYL (SUBLIMAZE) 2,500 mcg in sodium chloride 0.9 % 250 mL (10 mcg/mL) infusion  10 mcg/hr Intravenous Continuous Dory Horn, NP      . fentaNYL (SUBLIMAZE) bolus via infusion 25-50 mcg  25-50 mcg Intravenous Q30 min  PRN Dory Horn, NP      . fentaNYL (SUBLIMAZE) injection 50 mcg  50 mcg Intravenous Q2H PRN Dory Horn, NP      . Gerhardt's butt cream   Topical QID PRN Anders Simmonds, MD      . heparin injection 5,000 Units  5,000 Units Subcutaneous Q8H Javier Glazier, MD   5,000 Units at 07/28/15 1311  . insulin aspart (novoLOG) injection 0-15 Units  0-15 Units Subcutaneous Q4H Donita Brooks, NP   2 Units at 07/27/15 2335  . levothyroxine (SYNTHROID, LEVOTHROID) injection 56 mcg  56 mcg Intravenous Daily Donita Brooks, NP   56 mcg at 07/28/15 1100  . LORazepam (ATIVAN) injection 0.5-1 mg  0.5-1 mg Intravenous Q4H PRN Dory Horn, NP      . nystatin (MYCOSTATIN) powder   Topical TID Earnstine Regal, PA-C   1 Bottle at 07/28/15 1048  . ondansetron (ZOFRAN) injection 4 mg  4 mg Intravenous Q6H PRN Erroll Luna, MD      . piperacillin-tazobactam (ZOSYN) IVPB 3.375 g  3.375 g Intravenous Q8H Collene Gobble, MD   3.375 g at 07/28/15 0100  . sodium chloride flush (NS) 0.9 % injection 10-40 mL  10-40 mL Intracatheter PRN Judeth Horn, MD      . sodium chloride flush (NS) 0.9 % injection 10-40 mL  10-40 mL Intracatheter Q12H Judeth Horn, MD   10 mL at 07/27/15 0744     Discharge Medications: Please see discharge summary for a list of discharge medications.  Relevant Imaging Results:  Relevant Lab Results:   Additional Information SSN [id number]     Samule Dry, LCSW

## 2015-07-28 NOTE — Consult Note (Signed)
WOC consulted for options for management of fistula drainage.  Today when I arrived dressing was not saturated with fistula material, only the bottom edge of gauze.  Skin has some redness but based on the pattern it appears that it is related to contact dermatitis from the tape on her skin. She does appear to have some fungal overgrowth in the bilateral groin and buttocks area, using Gerhardt's cream to buttocks. Will add antifungal powder for the groin.  2 Sons and sister at the bedside and we discussed options for controlling the drainage and that the drainage due to the acidity can irritate the skin.  The family is waiting on a Palliative Care meeting and seems a bit overwhelmed with the situation.  I noted that they are trying to get in touch with family to come to see patient.  They seem to think the patient will not survive very long.  I have explained that we can place a pouch over the wound and control the drainage with suction if we need to, however this is a little more complex plan than managing the fistula with dressing changes.  I will re-evaluate the patient tomorrow for the amount of dressing changes required to keep patient's skin dry and decide at that point if we should pouch or not. The family made it clear that they do not want their sister and mother to be in pain, I have explained that the process of pouching would not be painful for the patient, but certainly her skin getting raw from effluent may be uncomfortable.  I will make a decision tomorrow based on the drainage at that time and also based on the outcomes of the patient/family palliative meeting today.   Thanks  Rayson Rando Kellogg, [place] [phone])

## 2015-07-28 NOTE — Progress Notes (Signed)
7 Days Post-Op  Subjective: She is awake and talking.  Her dressing is soaked with drainage from the EC fistula.  This was not present on Saturday when I saw her.  She wants to be a DNR, she is already a limited DNR.    Objective: Vital signs in last 24 hours: Temp:  [97.4 F (36.3 C)-98.2 F (36.8 C)] 97.4 F (36.3 C) (06/06 0359) Pulse Rate:  [84-113] 96 (06/06 0600) Resp:  [12-33] 19 (06/06 0700) BP: (130-163)/(68-111) 134/72 mmHg (06/06 0700) SpO2:  [96 %-100 %] 100 % (06/06 0600) Last BM Date: 07/28/15 4572 IV 3750 urine 650 from the NG Afebrile, VSS Creatinine is up K+ is 3.2 Prealbumin 11.1, Mag 2.3  CT scan yesterday shows:  1. There is oral contrast material in open abdominal wound anterior abdomen. There is contrast extravasation from small bowel at the anastomosis site in mid anterior abdomen under abdominal fascia supraumbilical region please see sagittal image 80. See axial image 48. There is probable fistulous tract connecting with the abdominal wound. Findings highly suspicious for enterocutaneous fistula. 2. No evidence of small bowel obstruction. There is diffuse thickening of small bowel wall highly suspicious for diffuse enteritis. 3. Contrast material is noted within distal colon and rectum. No evidence of distal colonic obstruction. There is thickening of distal sigmoid colon wall and rectum. Colitis or inflammatory process cannot be excluded. Clinical correlation is necessary.  Intake/Output from previous day: 06/05 0701 - 06/06 0700 In: 4572.1 [I.V.:640.8; NG/GT:1000; IV Piggyback:400; TPN:2531.3] Out: [id number] [Urine:3570; Emesis/NG output:650] Intake/Output this shift: Total I/O In: 50 [IV Piggyback:50] Out: -   General appearance: alert, cooperative, no distress and tired Resp: clear to auscultation bilaterally and anterior exam GI: her dressing was soaked thru with bilious drainage.  Her abdomen is getting red from the moisture and the tape, she  has some candida lower abdomen.Marland Kitchen  Open wound looks worse, the white tissue at the base now has bilious darainage coming from it.    Lab Results:   Recent Labs  07/26/15 0345 07/27/15 0419  WBC 16.7* 18.3*  HGB 8.4* 7.7*  HCT 27.4* 24.8*  PLT 328 378    BMET  Recent Labs  07/27/15 0419 07/28/15 0420  NA 136 139  K 3.1* 3.2*  CL 103 102  CO2 24 23  GLUCOSE 164* 105*  BUN 72* 77*  CREATININE 1.98* 2.02*  CALCIUM 8.1* 8.4*   PT/INR No results for input(s): LABPROT, INR in the last 72 hours.   Recent Labs Lab 07/22/15 0353 07/23/15 0440 07/24/15 0510 07/25/15 0510 07/26/15 0345 07/27/15 0419 07/28/15 0420  AST 58* 25 21  --   --  22  --   ALT 79* 48 35  --   --  24  --   ALKPHOS 253* 216* 198*  --   --  193*  --   BILITOT 0.3 <0.1* 0.3  --   --  0.3  --   PROT 4.4* 4.5* 4.6*  --   --  4.9*  --   ALBUMIN 1.2* 1.1* 1.1* <1.0* 1.0* 1.1* 1.2*     Lipase     Component Value Date/Time   LIPASE 30 07/10/2015 0018     Studies/Results: Ct Abdomen Pelvis Wo Contrast  07/27/2015  CLINICAL DATA:  Recent surgery for bowel perforation, drainage from the wound EXAM: CT ABDOMEN AND PELVIS WITHOUT CONTRAST TECHNIQUE: Multidetector CT imaging of the abdomen and pelvis was performed following the standard protocol without IV contrast. COMPARISON:  07/19/2015 FINDINGS: The study is limited without IV contrast. Small hiatal hernia is noted. Bilateral small pleural effusion. Mitral valve calcifications are noted. Unenhanced liver shows no biliary ductal dilatation. Small perihepatic ascites. Mild distended gallbladder. Small layering calcified gallstones are noted within gallbladder. Unenhanced pancreas is unremarkable. Unenhanced spleen is unremarkable. Oral contrast material was given to the patient. No gastric outlet obstruction. There is a midline lower abdominal open wound. Contrast material is noted within the open wound. There are postsurgical changes in mid lower small bowel.  Axial image 49 there is extravasation of the contrast material from small bowel in mid anterior small bowel mesentery and probable there is a fistulous tract into the open abdominal wound. Please see sagittal image 80. There is diffuse thickening of small bowel wall highly suspicious for diffuse enteritis. There is mesenteric edema and small free fluid noted within lower mesentery bilaterally. No evidence of small bowel obstruction. There is contrast material noted in descending colon and sigmoid colon. Few sigmoid colon diverticula. There is thickening of distal sigmoid colon and rectal wall. Findings highly suspicious for distal colitis. Thickening of urinary bladder wall. Cystitis cannot be excluded. The urinary bladder is decompressed. Probable Foley catheter within urinary bladder. There is anasarca infiltration of subcutaneous fat bilateral pelvic and lower abdominal wall. Again noted chronic right hydronephrosis. Study is limited without IV contrast. No ureteral calcified calculi are noted. Mild left hydronephrosis. The patient is status post hysterectomy. Degenerative changes are noted thoracolumbar spine IMPRESSION: 1. There is oral contrast material in open abdominal wound anterior abdomen. There is contrast extravasation from small bowel at the anastomosis site in mid anterior abdomen under abdominal fascia supraumbilical region please see sagittal image 80. See axial image 48. There is probable fistulous tract connecting with the abdominal wound. Findings highly suspicious for enterocutaneous fistula. 2. No evidence of small bowel obstruction. There is diffuse thickening of small bowel wall highly suspicious for diffuse enteritis. 3. Contrast material is noted within distal colon and rectum. No evidence of distal colonic obstruction. There is thickening of distal sigmoid colon wall and rectum. Colitis or inflammatory process cannot be excluded. Clinical correlation is necessary. 4. Thickening of urinary  bladder wall. Satellite cystitis cannot be excluded. 5. Again noted chronic mild right hydronephrosis. Mild left hydronephrosis. 6. Bilateral small pleural effusion with bilateral basilar posterior atelectasis. 7. Small perihepatic ascites. Small ascites and mesenteric edema in lower abdomen and pelvis. These results were called by telephone at the time of interpretation on 07/27/2015 at 2:18 pm to Dr. Erroll Luna , who verbally acknowledged these results. Electronically Signed   By: Lahoma Crocker M.D.   On: 07/27/2015 14:18   Dg Chest Port 1 View  07/27/2015  CLINICAL DATA:  Respiratory failure EXAM: PORTABLE CHEST 1 VIEW COMPARISON:  07/26/2015 FINDINGS: Support devices are stable. Heart is normal size. Mitral valve calcifications present. No confluent airspace opacity or effusion. IMPRESSION: No acute cardiopulmonary disease. Electronically Signed   By: Rolm Baptise M.D.   On: 07/27/2015 07:23    Medications: . antiseptic oral rinse  7 mL Mouth Rinse q12n4p  . chlorhexidine  15 mL Mouth Rinse BID  . heparin subcutaneous  5,000 Units Subcutaneous Q8H  . insulin aspart  0-15 Units Subcutaneous Q4H  . insulin glargine  10 Units Subcutaneous QHS  . levothyroxine  56 mcg Intravenous Daily  . piperacillin-tazobactam (ZOSYN)  IV  3.375 g Intravenous Q8H  . potassium chloride  10 mEq Intravenous Q1 Hr x 3  . sodium  chloride flush  10-40 mL Intracatheter Q12H   . sodium chloride 10 mL/hr at 07/28/15 0700  . amiodarone 30 mg/hr (07/28/15 0700)  . Marland KitchenTPN (CLINIMIX-E) Adult 100 mL/hr at 07/28/15 0700   And  . fat emulsion 240 mL (07/28/15 0700)  . TPN (CLINIMIX) Adult without lytes     And  . 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 Grandville Silos 07/10/15 Breakdown of previous small bowel anastomosis and peritonitis  Exploratory laparotomy with  resection of anastomosis and placement of wound vac--Dr. Brantley Stage 07/19/15 POD#6 Re-exploration of abdomen, creation of small bowel anastomosis, abdominal wall closure--Dr. Redmond Pulling 5/30, POD#4 ID-Zosyn D#6/10 peritonitis, Severe PCM-prealbumin 2.5,PICC, TPN Acute respiratory failure-appreciate CCM management.Extubated on 07/27/15 Acute renal failure- Hypokalemia Metastatic breast cancer/carcinomatosis AF with RVR-NSR on amio VTE prophylaxis-SCD/heparin   Plan: We have called Palliative to see, I will leave making changes to DNR to them.  Add some mycostatin powder to lower abdomen.  Ask Wound care to see and help [place] with the drainage.  I did not put wet gauze in the wound, I just packed it with dry Kerlix to absorb some of the fluid, I will make her paper tape only.  i think we need to do more frequent dressing changes and may need to try [place] straps.      LOS: 9 days    Jery Hollern 07/28/2015 [phone]

## 2015-07-28 NOTE — Progress Notes (Signed)
eLink Physician-Brief Progress Note Patient Name: Monica Morrison DOB: 25-May-1945 MRN: [id number]   Date of Service  07/28/2015  HPI/Events of Note  Hypoglycemia - Blood glucose = 35. TPN D/Ced earlier today.   eICU Interventions  Will change IV fluid to D5 0.9 NaCl to run at 50 mL/hour.     Intervention Category Major Interventions: Other:  Sommer,Steven Cornelia Copa 07/28/2015, 8:14 PM

## 2015-07-29 ENCOUNTER — Inpatient Hospital Stay (HOSPITAL_COMMUNITY): Payer: Commercial Managed Care - HMO

## 2015-07-29 LAB — CBC
HCT: 25.8 % — ABNORMAL LOW (ref 36.0–46.0)
Hemoglobin: 8.2 g/dL — ABNORMAL LOW (ref 12.0–15.0)
MCH: 24.6 pg — AB (ref 26.0–34.0)
MCHC: 31.8 g/dL (ref 30.0–36.0)
MCV: 77.2 fL — AB (ref 78.0–100.0)
PLATELETS: 439 10*3/uL — AB (ref 150–400)
RBC: 3.34 MIL/uL — ABNORMAL LOW (ref 3.87–5.11)
RDW: 18.2 % — AB (ref 11.5–15.5)
WBC: 20.7 10*3/uL — AB (ref 4.0–10.5)

## 2015-07-29 LAB — GLUCOSE, CAPILLARY
GLUCOSE-CAPILLARY: 109 mg/dL — AB (ref 65–99)
GLUCOSE-CAPILLARY: 107 mg/dL — AB (ref 65–99)
Glucose-Capillary: 82 mg/dL (ref 65–99)
Glucose-Capillary: 82 mg/dL (ref 65–99)

## 2015-07-29 LAB — BASIC METABOLIC PANEL
Anion gap: 11 (ref 5–15)
BUN: 73 mg/dL — AB (ref 6–20)
CALCIUM: 8 mg/dL — AB (ref 8.9–10.3)
CO2: 23 mmol/L (ref 22–32)
CREATININE: 1.88 mg/dL — AB (ref 0.44–1.00)
Chloride: 105 mmol/L (ref 101–111)
GFR calc Af Amer: 30 mL/min — ABNORMAL LOW (ref >60)
GFR, EST NON AFRICAN AMERICAN: 26 mL/min — AB (ref >60)
GLUCOSE: 96 mg/dL (ref 65–99)
Potassium: 4.2 mmol/L (ref 3.5–5.1)
Sodium: 139 mmol/L (ref 135–145)

## 2015-07-29 MED ORDER — SALINE SPRAY 0.65 % NA SOLN
2.0000 | NASAL | Status: DC | PRN
  Administered 2015-07-29: 2 via NASAL
  Filled 2015-07-29: qty 44

## 2015-07-29 NOTE — Progress Notes (Signed)
Daily Progress Note   Patient Name: Monica Morrison       Date: 07/29/2015 DOB: Jul 08, 1945  Age: 70 y.o. MRN#: [id number] Attending Physician: Nolon Nations, MD Primary Care Physician: Gara Kroner, MD Admit Date: 07/19/2015  Reason for Consultation/Follow-up: Non pain symptom management, Pain control, Psychosocial/spiritual support and Withdrawal of life-sustaining treatment  Subjective: Patient more alert today, makes good eye contact, conversant. She is only had 2 boluses overnight which were in conjunction with dressing changes. Overall feels like her pain is better managed and she is able to be alert and speak with her family which was her primary goal. She is requesting ice chips which I did order (patient has an NG tube to suction). Her abdominal wound is still draining copious amounts of secretions but wound care has now attached to pouch to contain some of this drainage and minimize dressing changes.  Length of Stay: 10  Current Medications: Scheduled Meds:  . alteplase  2 mg Intracatheter Once  . antiseptic oral rinse  7 mL Mouth Rinse q12n4p  . chlorhexidine  15 mL Mouth Rinse BID  . heparin subcutaneous  5,000 Units Subcutaneous Q8H  . insulin aspart  0-15 Units Subcutaneous Q4H  . levothyroxine  56 mcg Intravenous Daily  . nystatin   Topical TID  . sodium chloride flush  10-40 mL Intracatheter Q12H    Continuous Infusions: . amiodarone 30 mg/hr (07/29/15 1200)  . dextrose 5 % and 0.9% NaCl 50 mL/hr at 07/29/15 1200  . fentaNYL infusion INTRAVENOUS 10 mcg/hr (07/29/15 1200)    PRN Meds: fentaNYL, fentaNYL (SUBLIMAZE) injection, Gerhardt's butt cream, LORazepam, [DISCONTINUED] ondansetron **OR** ondansetron (ZOFRAN) IV, sodium chloride flush  Physical Exam  Constitutional: She  appears well-developed.  HENT:  Head: Normocephalic and atraumatic.  Cardiovascular: Normal rate.   Pulmonary/Chest: Effort normal.  Abdominal:  Open abd incision now with pouch to collect drainage Drainage has been copious  Genitourinary:  foley  Neurological: She is alert.  Oriented to person, place and situation.   Skin: Skin is warm.  Psychiatric: She has a normal mood and affect.  Nursing note and vitals reviewed.           Vital Signs: BP 117/64 mmHg  Pulse 85  Temp(Src) 97.9 F (36.6 C) (Oral)  Resp 17  Ht 5\' 5"  (1.651 m)  Wt 86 kg (189 lb 9.5 oz)  BMI 31.55 kg/m2  SpO2 96% SpO2: SpO2: 96 % O2 Device: O2 Device: Not Delivered O2 Flow Rate: O2 Flow Rate (L/min): 1 L/min  Intake/output summary:  Intake/Output Summary (Last 24 hours) at 07/29/15 1300 Last data filed at 07/29/15 1200  Gross per 24 hour  Intake 1578.1 ml  Output   3675 ml  Net -2096.9 ml   LBM: Last BM Date: 07/29/15 Baseline Weight: Weight: 75.751 kg (167 lb) Most recent weight: Weight: 86 kg (189 lb 9.5 oz)       Palliative Assessment/Data:    Flowsheet Rows        Most Recent Value   Intake Tab    Referral Department  Hospitalist   Unit at Time of Referral  ICU   Palliative Care Primary Diagnosis  Sepsis/Infectious Disease   Date Notified  07/28/15   Palliative Care Type  New Palliative care   Reason for referral  Non-pain Symptom, Pain, Clarify Goals of Care, Counsel Regarding Hospice, End of [place]   Date of Admission  07/19/15   Date first seen by Palliative Care  07/28/15   # of days Palliative referral response time  0 Day(s)   # of days IP prior to Palliative referral  9   Clinical Assessment    Palliative Performance Scale Score  30%   Pain Max last 24 hours  Not able to report   Pain Min Last 24 hours  Not able to report   Dyspnea Max Last 24 Hours  Not able to report   Dyspnea Min Last 24 hours  Not able to report   Nausea Max Last 24 Hours  Not able to  report   Nausea Min Last 24 Hours  Not able to report   Anxiety Max Last 24 Hours  Not able to report   Anxiety Min Last 24 Hours  Not able to report   Other Max Last 24 Hours  Not able to report   Psychosocial & Spiritual Assessment    Palliative Care Outcomes    Patient/Family meeting held?  Yes   Who was at the meeting?  both sons, and sister   Palliative Care Outcomes  Improved pain interventions, Improved non-pain symptom therapy, Clarified goals of care, Provided end of life care assistance, Counseled regarding hospice, Provided psychosocial or spiritual support   Patient/Family wishes: Interventions discontinued/not started   Mechanical Ventilation, Tube feedings/TPN, NIPPV, BiPAP, Hemodialysis, Trach, PEG, Transfusion, Vasopressors, Transfer out of ICU   Palliative Care follow-up planned  Yes, Facility      Patient Active Problem List   Diagnosis Date Noted  . Palliative care encounter   . Perforated bowel ([place]) 07/19/2015  . Peritonitis ([place]) 07/19/2015  . Hypertension 06/30/2015  . Hypokalemia 06/30/2015  . Dehydration 06/30/2015  . Small bowel obstruction ([place]) 06/29/2015  . Hypothyroidism 06/22/2015  . Campylobacter enteritis 06/18/2015  . LFT elevation 06/18/2015  . CKD (chronic kidney disease) stage 3, GFR 30-59 ml/min 06/18/2015  . Acute-on-chronic kidney injury ([place]) 06/18/2015  . Chronic kidney disease 06/17/2015  . Diabetes mellitus with complication ([place])   . Malignant neoplasm of female breast ([place])   . SBO (small bowel obstruction) ([place]) 06/15/2015  . Breast cancer of lower-outer quadrant of left female breast ([place]) 03/07/2012    Palliative Care Assessment & Plan   Patient Profile: Patient is 70 year old female with a previous medical history of hypertension, diabetes type 2, chronic kidney disease, arthritis, breast cancer  with recent admission from [phone] for small bowel obstruction. She underwent exploratory lap with small bowel resection at that time.  She presented to [facility] on 528 with complaints of severe abdominal pain. CT scan of the abdomen revealed free air. She was transferred to [facility] for emergent surgery. 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 She wants no further surgeries were aggressive measures. Family trending towards comfort care but wants to allow time for friends and family to visit  Assessment: Patient is alert today. She states her pain is better managed. She is unable to quantify in terms of using the pain scale her current pain rating. There were only 2 boluses utilized and nose were in conjunction with dressing changes. She does still reiterate to me that she wants no further surgeries" I'm ready to go". I did ask her if she was afraid, and she stated no. She is sad about leaving her family however  Recommendations/Plan: Plan is to allow for family and friends to visit patient before further de-escalation of care. Per our conversation on 07/28/2015 I anticipate this occurring on 07/30/2015. At that point we'll approach family about discontinuation of amiodarone drip, blood thinners, antibiotics,. I anticipate IV fluids being one of the last interventions for family to let go of but patient has been very clear in her instructions to them that she is ready to die.  Pain: Continue with fentanyl drip at 10 g an hour and 25-50 g every 30 minutes when necessary. Anticipate need for dosage titration.  Dyspnea: No increased work of breathing noted. Continue with O2 via nasal cannula and opioids for severe symptom management  Dry mouth: We'll allow patient to have ice chips with a small amount of flavoring on ice chips such as ginger ale as long as patient is connected to NG tube. Would recommend leaving the NG tube and as long as she can tolerate this to avoid any vomiting  Anxiety: Seems improved with better pain management. Continue with Ativan when  necessary and monitor for need for scheduled dosing  Goals of Care and Additional Recommendations:  Limitations on Scope of Treatment: No Artificial Feeding, No Blood Transfusions, No Chemotherapy, No Diagnostics, No Hemodialysis, No Radiation, No Surgical Procedures and No Tracheostomy  Code Status:    Code Status Orders        Start     Ordered   07/28/15 1221  Do not attempt resuscitation (DNR)   Continuous    Question Answer Comment  In the event of cardiac or respiratory ARREST Do not call a "code blue"   In the event of cardiac or respiratory ARREST Do not perform Intubation, CPR, defibrillation or ACLS   In the event of cardiac or respiratory ARREST Use medication by any route, position, wound care, and other measures to relive pain and suffering. May use oxygen, suction and manual treatment of airway obstruction as needed for comfort.      07/28/15 1221    Code Status History    Date Active Date Inactive Code Status Order ID Comments User Context   07/19/2015 11:15 AM 07/28/2015 12:21 PM Partial Code [id number]  Erby Pian, NP Inpatient   07/19/2015  6:40 AM 07/19/2015 11:15 AM Full Code [id number]  Gabriel Rainwater, RN Inpatient   07/10/2015  1:49 AM 07/16/2015  5:38 PM Full Code [id number]  Etta Quill, DO ED   06/30/2015  1:25 AM 07/04/2015  4:14 PM Full Code [id number]  Edwin Dada, MD Inpatient   06/15/2015  2:53 PM 06/22/2015  2:22 PM Full Code [id number]  Earnstine Regal, PA-C ED    Advance Directive Documentation        Most Recent Value   Type of Advance Directive  Healthcare Power of Attorney, Out of facility DNR (pink MOST or yellow form)   Pre-existing out of facility DNR order (yellow form or pink MOST form)  Physician notified to receive inpatient order   "MOST" Form in Place?         Prognosis:   Hours - Days in the setting of metastatic breast cancer now with carcinomatosis status post small bowel obstruction with new EC fistula formation, bilateral  hydronephrosis.  Discharge Planning:  Anticipated Hospital Death  Care plan was discussed with son, Nataliya Dalrymple and Imogene Burn, [place]  Thank you for allowing the Palliative Medicine Team to assist in the care of this patient.   Time In: 1200 Time Out: 1300 Total Time 60 min Prolonged Time Billed  no       Greater than 50%  of this time was spent counseling and coordinating care related to the above assessment and plan.  Dory Horn, NP  Please contact Palliative Medicine Team phone at [phone] for questions and concerns.

## 2015-07-29 NOTE — Progress Notes (Signed)
Nutrition Brief Note  Chart reviewed. Pt now transitioning to comfort care.  No further nutrition interventions warranted at this time.  Please re-consult as needed.   Danyetta Gillham A. Ladina Shutters, RD, LDN, CDE Pager: [phone] After hours Pager: [phone]  

## 2015-07-29 NOTE — Progress Notes (Signed)
8 Days Post-Op  Subjective: Pt comfortable  Objective: Vital signs in last 24 hours: Temp:  [97.9 F (36.6 C)-98.6 F (37 C)] 97.9 F (36.6 C) (06/07 0400) Pulse Rate:  [73-96] 78 (06/07 0700) Resp:  [15-24] 16 (06/07 0700) BP: (91-149)/(52-102) 91/67 mmHg (06/07 0700) SpO2:  [100 %] 100 % (06/07 0700) Last BM Date: 07/28/15  Intake/Output from previous day: 06/06 0701 - 06/07 0700 In: 1683.1 [I.V.:1283.1; IV Piggyback:150; TPN:250] Out: 3125 [Urine:2525; Emesis/NG output:600] Intake/Output this shift:    Incision/Wound:open with succus drainage   Lab Results:   Recent Labs  07/27/15 0419 07/29/15 0400  WBC 18.3* 20.7*  HGB 7.7* 8.2*  HCT 24.8* 25.8*  PLT 378 439*   BMET  Recent Labs  07/28/15 0420 07/29/15 0400  NA 139 139  K 3.2* 4.2  CL 102 105  CO2 23 23  GLUCOSE 105* 96  BUN 77* 73*  CREATININE 2.02* 1.88*  CALCIUM 8.4* 8.0*   PT/INR No results for input(s): LABPROT, INR in the last 72 hours. ABG No results for input(s): PHART, HCO3 in the last 72 hours.  Invalid input(s): PCO2, PO2  Studies/Results: Ct Abdomen Pelvis Wo Contrast  07/27/2015  CLINICAL DATA:  Recent surgery for bowel perforation, drainage from the wound EXAM: CT ABDOMEN AND PELVIS WITHOUT CONTRAST TECHNIQUE: Multidetector CT imaging of the abdomen and pelvis was performed following the standard protocol without IV contrast. COMPARISON:  07/19/2015 FINDINGS: The study is limited without IV contrast. Small hiatal hernia is noted. Bilateral small pleural effusion. Mitral valve calcifications are noted. Unenhanced liver shows no biliary ductal dilatation. Small perihepatic ascites. Mild distended gallbladder. Small layering calcified gallstones are noted within gallbladder. Unenhanced pancreas is unremarkable. Unenhanced spleen is unremarkable. Oral contrast material was given to the patient. No gastric outlet obstruction. There is a midline lower abdominal open wound. Contrast material is  noted within the open wound. There are postsurgical changes in mid lower small bowel. Axial image 49 there is extravasation of the contrast material from small bowel in mid anterior small bowel mesentery and probable there is a fistulous tract into the open abdominal wound. Please see sagittal image 80. There is diffuse thickening of small bowel wall highly suspicious for diffuse enteritis. There is mesenteric edema and small free fluid noted within lower mesentery bilaterally. No evidence of small bowel obstruction. There is contrast material noted in descending colon and sigmoid colon. Few sigmoid colon diverticula. There is thickening of distal sigmoid colon and rectal wall. Findings highly suspicious for distal colitis. Thickening of urinary bladder wall. Cystitis cannot be excluded. The urinary bladder is decompressed. Probable Foley catheter within urinary bladder. There is anasarca infiltration of subcutaneous fat bilateral pelvic and lower abdominal wall. Again noted chronic right hydronephrosis. Study is limited without IV contrast. No ureteral calcified calculi are noted. Mild left hydronephrosis. The patient is status post hysterectomy. Degenerative changes are noted thoracolumbar spine IMPRESSION: 1. There is oral contrast material in open abdominal wound anterior abdomen. There is contrast extravasation from small bowel at the anastomosis site in mid anterior abdomen under abdominal fascia supraumbilical region please see sagittal image 80. See axial image 48. There is probable fistulous tract connecting with the abdominal wound. Findings highly suspicious for enterocutaneous fistula. 2. No evidence of small bowel obstruction. There is diffuse thickening of small bowel wall highly suspicious for diffuse enteritis. 3. Contrast material is noted within distal colon and rectum. No evidence of distal colonic obstruction. There is thickening of distal sigmoid colon wall  and rectum. Colitis or inflammatory  process cannot be excluded. Clinical correlation is necessary. 4. Thickening of urinary bladder wall. Satellite cystitis cannot be excluded. 5. Again noted chronic mild right hydronephrosis. Mild left hydronephrosis. 6. Bilateral small pleural effusion with bilateral basilar posterior atelectasis. 7. Small perihepatic ascites. Small ascites and mesenteric edema in lower abdomen and pelvis. These results were called by telephone at the time of interpretation on 07/27/2015 at 2:18 pm to Dr. Erroll Luna , who verbally acknowledged these results. Electronically Signed   By: Lahoma Crocker M.D.   On: 07/27/2015 14:18    Anti-infectives: Anti-infectives    Start     Dose/Rate Route Frequency Ordered Stop   07/19/15 1000  piperacillin-tazobactam (ZOSYN) IVPB 3.375 g     3.375 g 12.5 mL/hr over 240 Minutes Intravenous Every 8 hours 07/19/15 0828 07/28/15 1400   07/19/15 0608  dextrose 5 % with cefOXitin (MEFOXIN) ADS Med    Comments:  [name]   : cabinet override      07/19/15 0608 07/19/15 0629      Assessment/Plan: s/p Procedure(s): REEXPLORATION ABDOMINAL WOUND/POSSIBLE BOWEL ANASTAMOSIS (N/A) Family has opted for comfort care at this point To floor Appreciate palliative care input Focus on comfort   D/C TNA   LOS: 10 days    Harvey Lingo A. 07/29/2015

## 2015-07-29 NOTE — Progress Notes (Signed)
Pt transferred via bed, vss, receiving RN at bedside, palliative NP notified family of new room on [address], all pt belonging sent with patient. Sherrie Mustache

## 2015-07-29 NOTE — Progress Notes (Signed)
Attempted to call report to Baxter Flattery, RN on [employer], unable to take report at this time, a/w callback.  Kathleen Argue S 10:56 AM

## 2015-07-29 NOTE — Consult Note (Signed)
WOC wound follow up Wound type: midline surgical wound, open with EC fistula at the proximal end Measurement: 21cm x 6cm x 3.5cm  Wound bed: subcutaneous tissue, with EC fistula noted in the proximal end Drainage (amount, consistency, odor) green bilious effluent Periwound: some contact dermatitis noted from tape  Dressing procedure/placement/frequency: WOC placed large Eakin pouch today to LWS at 174mmHG.  Extra Eakin material placed along the left lateral edge of the wound and at the umbilicus to lessen likelihood of leakage. 46F Red Robinson catheter placed throught the spigot into the distal edge of the pouch to drain material from pouch while inpatient.  Could do same as outpatient (use Eakin fistula pouch) without suction.  Bedside nurse in to assist. Requested to monitor suction catheter for placement in pouch periodically.    [place] team will remain available as needed for assistance with fistula management. Nickolaos Brallier [place] RN,CWOCN [phone]

## 2015-07-29 NOTE — Clinical Social Work Note (Signed)
Clinical Social Work Assessment  Patient Details  Name: Monica Morrison MRN: [id number] Date of Birth: 1945-02-23  Date of referral:                  Reason for consult:  Discharge Planning                Permission sought to share information with:  Chartered certified accountant granted to share information::  Yes, Verbal Permission Granted  Name::     [place], Engineer, maintenance::  SNFS  Relationship::  Son  Sport and exercise psychologist Information:     Housing/Transportation Living arrangements for the past 2 months:  [place] of Information:  Patient, Adult Children Patient Interpreter Needed:  None Criminal Activity/Legal Involvement Pertinent to Current Situation/Hospitalization:  No - Comment as needed Significant Relationships:  Adult Children, Siblings Lives with:  Self Do you feel safe going back to the place where you live?  Yes Need for family participation in patient care:  No (Coment)  Care giving concerns:  Patient from SNF. Patient's family would like different SNF.   Social Worker assessment / plan:  CSW spoke with patient's son. CSW explained SNF search process. Patient's son would like Nutritional therapist. CSW to follow up with bed offers.   Employment status:  Retired Nurse, adult PT Recommendations:  [place] / Referral to community resources:  [place]  Patient/Family's Response to care:  The patient's son is happy with the care the patient has received.   Patient/Family's Understanding of and Emotional Response to Diagnosis, Current Treatment, and Prognosis: The patient's son has a good understanding of why the patient was admitted. He understands the care plan and what the patient will need post discharge. Emotional Assessment Appearance:  Appears stated age Attitude/Demeanor/Rapport:  Unable to Assess Affect (typically observed):  Unable to Assess Orientation:   (unable  to assess) Alcohol / Substance use:  Not Applicable Psych involvement (Current and /or in the community):  No (Comment)  Discharge Needs  Concerns to be addressed:  Discharge Planning Concerns Readmission within the last 30 days:  No Current discharge risk:  Physical Impairment Barriers to Discharge:  Continued Medical Work up   [employer], LCSW 07/29/2015, 8:51 AM

## 2015-07-29 NOTE — Progress Notes (Signed)
Report given to Baxter Flattery, RN on [employer].  Kathleen Argue S 11:44 AM

## 2015-07-30 DIAGNOSIS — Z515 Encounter for palliative care: Secondary | ICD-10-CM | POA: Insufficient documentation

## 2015-07-30 DIAGNOSIS — R21 Rash and other nonspecific skin eruption: Secondary | ICD-10-CM | POA: Insufficient documentation

## 2015-07-30 DIAGNOSIS — R451 Restlessness and agitation: Secondary | ICD-10-CM | POA: Insufficient documentation

## 2015-07-30 LAB — GLUCOSE, CAPILLARY: GLUCOSE-CAPILLARY: 36 mg/dL — AB (ref 65–99)

## 2015-07-30 MED ORDER — POLYVINYL ALCOHOL 1.4 % OP SOLN
1.0000 [drp] | Freq: Four times a day (QID) | OPHTHALMIC | Status: DC | PRN
  Filled 2015-07-30: qty 15

## 2015-07-30 MED ORDER — GLYCOPYRROLATE 0.2 MG/ML IJ SOLN: 0.2000 mg | INTRAMUSCULAR | Status: DC | PRN

## 2015-07-30 MED ORDER — DIPHENHYDRAMINE HCL 50 MG/ML IJ SOLN: 25.0000 mg | Freq: Three times a day (TID) | INTRAMUSCULAR | Status: DC

## 2015-07-30 MED ORDER — ACETAMINOPHEN 650 MG RE SUPP: 650.0000 mg | Freq: Four times a day (QID) | RECTAL | Status: DC | PRN

## 2015-07-30 MED ORDER — DIPHENHYDRAMINE HCL 50 MG/ML IJ SOLN
25.0000 mg | INTRAMUSCULAR | Status: DC
  Administered 2015-07-30: 25 mg via INTRAVENOUS
  Filled 2015-07-30: qty 1

## 2015-07-30 MED ORDER — HALOPERIDOL LACTATE 2 MG/ML PO CONC
0.5000 mg | ORAL | Status: DC | PRN
  Filled 2015-07-30: qty 0.3

## 2015-07-30 MED ORDER — BIOTENE DRY MOUTH MT LIQD: 15.0000 mL | OROMUCOSAL | Status: DC | PRN

## 2015-07-30 MED ORDER — GLYCOPYRROLATE 1 MG PO TABS
1.0000 mg | ORAL_TABLET | ORAL | Status: DC | PRN
  Filled 2015-07-30: qty 1

## 2015-07-30 MED ORDER — ACETAMINOPHEN 325 MG PO TABS: 650.0000 mg | ORAL_TABLET | Freq: Four times a day (QID) | ORAL | Status: DC | PRN

## 2015-07-30 MED ORDER — LORAZEPAM 2 MG/ML IJ SOLN
1.0000 mg | Freq: Four times a day (QID) | INTRAMUSCULAR | Status: DC
  Administered 2015-07-30: 1 mg via INTRAVENOUS
  Filled 2015-07-30: qty 1

## 2015-07-30 MED ORDER — HYDROMORPHONE BOLUS VIA INFUSION
1.0000 mg | INTRAVENOUS | Status: DC | PRN
  Filled 2015-07-30: qty 1

## 2015-07-30 MED ORDER — HALOPERIDOL LACTATE 5 MG/ML IJ SOLN: 0.5000 mg | INTRAMUSCULAR | Status: DC | PRN

## 2015-07-30 MED ORDER — LORAZEPAM 2 MG/ML PO CONC: 1.0000 mg | ORAL | Status: DC | PRN

## 2015-07-30 MED ORDER — SODIUM CHLORIDE 0.9 % IV SOLN
1.0000 mg/h | INTRAVENOUS | Status: DC
  Administered 2015-07-30: 1 mg/h via INTRAVENOUS
  Filled 2015-07-30: qty 5

## 2015-07-30 MED ORDER — SODIUM CHLORIDE 0.9% FLUSH: 3.0000 mL | Freq: Two times a day (BID) | INTRAVENOUS | Status: DC

## 2015-07-30 MED ORDER — SODIUM CHLORIDE 0.9 % IV SOLN: 250.0000 mL | INTRAVENOUS | Status: DC | PRN

## 2015-07-30 MED ORDER — HALOPERIDOL 1 MG PO TABS: 0.5000 mg | ORAL_TABLET | ORAL | Status: DC | PRN

## 2015-07-30 MED ORDER — LORAZEPAM 1 MG PO TABS: 1.0000 mg | ORAL_TABLET | ORAL | Status: DC | PRN

## 2015-07-30 MED ORDER — LORAZEPAM 2 MG/ML IJ SOLN: 1.0000 mg | INTRAMUSCULAR | Status: DC | PRN

## 2015-07-30 MED ORDER — SODIUM CHLORIDE 0.9% FLUSH: 3.0000 mL | INTRAVENOUS | Status: DC | PRN

## 2015-07-30 NOTE — Clinical Social Work Note (Signed)
Clinical Education officer, museum received a call from [facility] indicating that family wishes for patient to transition to residential hospice facility. Family and hospice facility aware of possible hospital death. Attending PA, agreeable to transition patient to [facility]. PA to complete discharge summary and place d/c orders.   CSW contacted pt's son, Christean Grief in regards to d/c planning. Pt's son expressed that transitioning patient to [facility] would allow patient to be closer to family. No further concerns reported at this time.   CSW has completed [facility] transport forms and awaiting d/c information.   Clinical Social Worker facilitated patient discharge including contacting patient family and facility to confirm patient discharge plans.  Clinical information faxed to facility and family agreeable with plan.  CSW arranged ambulance transport via [facility] to [facility].  RN to call report prior to discharge.  Clinical Social Worker will sign off for now as social work intervention is no longer needed. Please consult [place] again if new need arises.  Glendon Axe, MSW, LCSWA [phone] 07/30/2015 2:58 PM

## 2015-07-30 NOTE — Discharge Summary (Signed)
[facility] Discharge Summary   Patient ID: Monica Morrison MRN: [id number] DOB/AGE: [birth year] 71 y.o.  Admit date: 07/19/2015 Discharge date: 07/30/2015  Admitting Diagnosis: perforated viscus secondary to small bowel resection with anastomotic leak  Discharge Diagnosis Patient Active Problem List   Diagnosis Date Noted  . Rash and nonspecific skin eruption   . Agitation   . Terminal care   . Palliative care encounter   . Perforated bowel ([place]) 07/19/2015  . Peritonitis ([place]) 07/19/2015  . Hypertension 06/30/2015  . Hypokalemia 06/30/2015  . Dehydration 06/30/2015  . Small bowel obstruction ([place]) 06/29/2015  . Hypothyroidism 06/22/2015  . Campylobacter enteritis 06/18/2015  . LFT elevation 06/18/2015  . CKD (chronic kidney disease) stage 3, GFR 30-59 ml/min 06/18/2015  . Acute-on-chronic kidney injury ([place]) 06/18/2015  . Chronic kidney disease 06/17/2015  . Diabetes mellitus with complication ([place])   . Malignant neoplasm of female breast ([place])   . SBO (small bowel obstruction) ([place]) 06/15/2015  . Breast cancer of lower-outer quadrant of left female breast ([place]) 03/07/2012    Consultants [place], [place], MD [place], NP  Imaging: Dg Chest Port 1 View  07/29/2015  CLINICAL DATA:  Respiratory failure EXAM: PORTABLE CHEST 1 VIEW COMPARISON:  09/26/2015 FINDINGS: Cardiomediastinal silhouette is stable. Endotracheal tube has been removed. Stable NG tube position. Study is limited by poor inspiration. Mild basilar atelectasis. No pulmonary edema. Mitral valve calcifications. Degenerative changes bilateral shoulders. Stable right arm PICC line position with tip in right atrium. IMPRESSION: NG tube in place. Endotracheal tube has been removed. No active disease. Electronically Signed   By: Lahoma Crocker M.D.   On: 07/29/2015 08:17   07/27/15: CT ABD/PELV W/OUT CONTRAST: There is oral contrast material in open  abdominal wound anterior abdomen. There is contrast extravasation from small bowel at the anastomosis site in mid anterior abdomen under abdominal fascia supraumbilical region.There is probable fistulous tract connecting with the abdominal wound. Findings highly suspicious for enterocutaneous fistula.2. No evidence of small bowel obstruction. There is diffuse thickening of small bowel wall highly suspicious for diffuse enteritis.  Procedures 07/19/15- Dr. Tobe Sos - exploratory laparotomy, small bowel anastomosis resection  07/21/15 - Dr. Greer Pickerel - re-exploration of abdomen, creation of small bowel anastamosis, abdominal wall closure  Hospital Course:  70 y/o female with a PMH of metastatic breast cancer and carcinomatosis s/p small bowel resection on 07/10/15 who was transferred to [facility] with abdominal pain. Patient was discharged from hospital 3 days prior (07/16/15) but developed severe abdominal pain bringing her back to the hospital. CT scan in [facility] revealed free air and free fluid with oral contrast, suspect breakdown of prior small bowel anastomosis from her operation on 07/10/15. Patient exhibited diffuse abdominal pain with rebound tenderness and guarding (peritoneal signs). Patient was placed on IV antibiotics and taken emergently to OR for the procedure above.  Abdomen left open and wound vac was placed in the OR and the patient was taken to ICU, intubated and in critical condition. NG tube and foley catheter in place. Patient taken back to the OR 2 days later for the above procedure before going back to the ICU in stable condition. PICC line placed 5/30. TPN was started on 07/22/15 for protein calorie malnutrition. Attempts to wean of the vent were stopped due to concern for tracheal edema but pt successfully extubated on 6/517. 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 Patient denied and further surgical intervention  and was seen by palliative care where she made it clear that she was  ready to die. [place] nurse consulted for management of fistula drainage and a pouch was placed. Palliative care helped slowly de-escalate patients care in order for family to see the patient prior to death. Patient was made total comfort measures on 07/30/15 and is being discharged to [place].    Medication List    STOP taking these medications        alendronate 70 MG tablet  Commonly known as:  FOSAMAX     ALPRAZolam 0.25 MG tablet  Commonly known as:  XANAX     atorvastatin 20 MG tablet  Commonly known as:  LIPITOR     bismuth subsalicylate [id number] suspension  Commonly known as:  PEPTO BISMOL     CALCIUM [id number] PO     CERTAVITE/ANTIOXIDANTS Tabs     escitalopram 10 MG tablet  Commonly known as:  LEXAPRO     levothyroxine 112 MCG tablet  Commonly known as:  SYNTHROID, LEVOTHROID     METAMUCIL PO     metoprolol tartrate 25 MG tablet  Commonly known as:  LOPRESSOR     omeprazole 20 MG tablet  Commonly known as:  PRILOSEC OTC     ONE TOUCH ULTRA TEST test strip  Generic drug:  glucose blood     pioglitazone 15 MG tablet  Commonly known as:  ACTOS     PROCEL 100 Powd     saccharomyces boulardii 250 MG capsule  Commonly known as:  FLORASTOR          Signed: Obie Dredge, PA-C [facility] 07/30/2015, 3:14 PM Pager: [phone] Mon-Fri 7:00 am-4:30 pm Sat-Sun 7:00 am-11:30 am

## 2015-07-30 NOTE — Progress Notes (Addendum)
Daily Progress Note   Patient Name: Monica Morrison       Date: 07/30/2015 DOB: 12-Feb-1946  Age: 70 y.o. MRN#: [id number] Attending Physician: Nolon Nations, MD Primary Care Physician: Gara Kroner, MD Admit Date: 07/19/2015  Reason for Consultation/Follow-up: Non pain symptom management, Pain control, Psychosocial/spiritual support and Terminal Care  Subjective: Patient with altered mental status.  States she wants her gown off.  The patient's sister and daughter are at bedside this morning.  They tell me that patient had a very difficult night.  She was anxious and restless and wants her gown off.  None of the three of the slept last night.  Jana Half and Lynelle Smoke are very clear that they want the patient made comfortable.  Jana Half asks for her to be transferred to the Hospice home in [facility].  Changes were made to the patient's medications to improve her comfort.    Son, Christean Grief and dtr in Estate manager/land agent arrived.  Christean Grief is HCPOA.  Christean Grief also states he wants the patient to be made comfortable and he wants her transferred to [place] on [address] in [place].  Length of Stay: 11  Current Medications: Scheduled Meds:  . alteplase  2 mg Intracatheter Once  . antiseptic oral rinse  7 mL Mouth Rinse q12n4p  . chlorhexidine  15 mL Mouth Rinse BID  . diphenhydrAMINE  25 mg Intravenous Q8H  . LORazepam  1 mg Intravenous Q6H  . nystatin   Topical TID  . sodium chloride flush  10-40 mL Intracatheter Q12H  . sodium chloride flush  3 mL Intravenous Q12H    Continuous Infusions: . HYDROmorphone 1 mg/hr (07/30/15 0907)    PRN Meds: sodium chloride, acetaminophen **OR** acetaminophen, antiseptic oral rinse, antiseptic oral rinse, Gerhardt's butt cream, glycopyrrolate **OR** glycopyrrolate  **OR** glycopyrrolate, haloperidol **OR** haloperidol **OR** haloperidol lactate, HYDROmorphone, LORazepam **OR** LORazepam **OR** LORazepam, [DISCONTINUED] ondansetron **OR** ondansetron (ZOFRAN) IV, polyvinyl alcohol, sodium chloride, sodium chloride flush, sodium chloride flush  Physical Exam  Chronically ill appearing female, N/G in place with dark liquid in the tube CV rrr Resp NAD Abdomen  Open wound bandaged - bandage appears soaked with dark body fluid.  The fluid is also pooled in the center of the wound area Extremities  2+ edema Skin -erythematous and generally angry appearing non papular rash  on the body thorax.         Vital Signs: BP 135/67 mmHg  Pulse 93  Temp(Src) 98.9 F (37.2 C) (Oral)  Resp 20  Ht 5\' 5"  (1.651 m)  Wt 86 kg (189 lb 9.5 oz)  BMI 31.55 kg/m2  SpO2 93% SpO2: SpO2: 93 % O2 Device: O2 Device: Not Delivered O2 Flow Rate: O2 Flow Rate (L/min): 1 L/min  Intake/output summary:   Intake/Output Summary (Last 24 hours) at 07/30/15 1414 Last data filed at 07/30/15 1050  Gross per 24 hour  Intake   1117 ml  Output   3175 ml  Net  -2058 ml   LBM: Last BM Date: 07/29/15 Baseline Weight: Weight: 75.751 kg (167 lb) Most recent weight: Weight: 86 kg (189 lb 9.5 oz)       Palliative Assessment/Data:    Flowsheet Rows        Most Recent Value   Intake Tab    Referral Department  Hospitalist   Unit at Time of Referral  ICU   Palliative Care Primary Diagnosis  Sepsis/Infectious Disease   Date Notified  07/28/15   Palliative Care Type  New Palliative care   Reason for referral  Non-pain Symptom, Pain, Clarify Goals of Care, Counsel Regarding Hospice, End of Life Care Assistance   Date of Admission  07/19/15   Date first seen by Palliative Care  07/28/15   # of days Palliative referral response time  0 Day(s)   # of days IP prior to Palliative referral  9   Clinical Assessment    Palliative Performance Scale Score  10%   Pain Max last 24 hours  Not  able to report   Pain Min Last 24 hours  Not able to report   Dyspnea Max Last 24 Hours  Not able to report   Dyspnea Min Last 24 hours  Not able to report   Nausea Max Last 24 Hours  Not able to report   Nausea Min Last 24 Hours  Not able to report   Anxiety Max Last 24 Hours  Not able to report   Anxiety Min Last 24 Hours  Not able to report   Other Max Last 24 Hours  Not able to report   Psychosocial & Spiritual Assessment    Palliative Care Outcomes    Patient/Family meeting held?  Yes   Who was at the meeting?  dtr and sister with patient   Palliative Care Outcomes  Improved pain interventions, Improved non-pain symptom therapy, Changed to focus on comfort, Provided end of life care assistance   Patient/Family wishes: Interventions discontinued/not started   Mechanical Ventilation, Tube feedings/TPN, NIPPV, BiPAP, Hemodialysis, Trach, PEG, Transfusion, Vasopressors, Transfer out of ICU   Palliative Care follow-up planned  Yes, Facility      Patient Active Problem List   Diagnosis Date Noted  . Palliative care encounter   . Perforated bowel ([place]) 07/19/2015  . Peritonitis ([place]) 07/19/2015  . Hypertension 06/30/2015  . Hypokalemia 06/30/2015  . Dehydration 06/30/2015  . Small bowel obstruction ([place]) 06/29/2015  . Hypothyroidism 06/22/2015  . Campylobacter enteritis 06/18/2015  . LFT elevation 06/18/2015  . CKD (chronic kidney disease) stage 3, GFR 30-59 ml/min 06/18/2015  . Acute-on-chronic kidney injury ([place]) 06/18/2015  . Chronic kidney disease 06/17/2015  . Diabetes mellitus with complication ([place])   . Malignant neoplasm of female breast ([place])   . SBO (small bowel obstruction) ([place]) 06/15/2015  . Breast cancer of lower-outer quadrant of left female  breast ([place]) 03/07/2012    Palliative Care Assessment & Plan   Patient Profile: Patient is 70 year old female with a previous medical history of hypertension, diabetes type 2, chronic kidney disease, arthritis, breast  cancer with recent admission from [phone] for small bowel obstruction. She underwent exploratory lap with small bowel resection at that time. She presented to [facility] regional on 528 with complaints of severe abdominal pain. CT scan of the abdomen revealed free air. She was transferred to [facility] for emergent surgery. 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   Assessment: Patient appears as though she is beginning the process of actively dying.  New erythematous rash on thorax.    Recommendations/Plan:  Rotate opioids - fentanyl to dilaudid for increased pain relief  Diphenhydramine - q8 iv for erythematous rash  Scheduled ativan - for severe agitation.  Goals of Care and Additional Recommendations:  Limitations on Scope of Treatment: Full Comfort Care  Code Status:DNR  Prognosis:   Hours - Days  Discharge Planning:  Hospice facility vs Hospital death  Care plan was discussed with Family, RN at bedside, Kathlee Nations -Surgery PA, and [place].  Thank you for allowing the Palliative Medicine Team to assist in the care of this patient.   Time In: 7:45 Time Out: 8:30 Total Time 45 min Prolonged Time Billed no      Greater than 50%  of this time was spent counseling and coordinating care related to the above assessment and plan.  Melton Alar, PA-C  Please contact Palliative Medicine Team phone at [phone] for questions and concerns.

## 2015-07-30 NOTE — Progress Notes (Signed)
Report called to [facility]. Patient belongings sent with family. [facility] in route to transfer patient. Patient ready for discharge.

## 2015-07-30 NOTE — Care Management Important Message (Signed)
Important Message  Patient Details  Name: Monica Morrison MRN: [id number] Date of Birth: [birth year]   Medicare Important Message Given:  Yes    Loann Quill 07/30/2015, 10:23 AM

## 2015-07-30 NOTE — Progress Notes (Signed)
Wasted 75 mL of 100 mL Dilaudid gtt in sink. Witnessed by Blanch Media, RN AD.

## 2015-07-30 NOTE — Progress Notes (Signed)
[facility] Progress Note  9 Days Post-Op  Subjective: Pt laying in bed in NAD. Two family members in the room with her. Patient denies pain or nausea. Family members report that the patient was restless over night - moving her legs and wanting to take her gown off. Family reports skin "blister" concerns around cardiac telemetry adhesives.   Palliative Care team also in the room with the patient - report the patient has decided on complete comfort measures. All unnecessary lines and medications will be removed, scheduled dilaudid, ativan, and benadryl.   Objective: Vital signs in last 24 hours: Temp:  [97.9 F (36.6 C)-98.9 F (37.2 C)] 98.9 F (37.2 C) (06/08 0513) Pulse Rate:  [78-93] 93 (06/08 0513) Resp:  [14-20] 20 (06/08 0513) BP: (103-135)/(54-69) 135/67 mmHg (06/08 0513) SpO2:  [93 %-100 %] 93 % (06/08 0513) Last BM Date: 07/29/15  Intake/Output from previous day: 06/07 0701 - 06/08 0700 In: 1525.5 [P.O.:60; I.V.:1405.5; NG/GT:60] Out: 3975 [Urine:1875; Emesis/NG output:1800] Intake/Output this shift:    PE: Gen:   NAD, pleasant Card:  RRR, no M/G/R heard Pulm:  CTA, no W/R/R; Notable skin irritation/contact dermatitis over chest, especially around cardiac telemetry leads and over breasts. Abd: Soft, NT/ND, +BS, Laparotomy incision with pouch collecting copious abdominal drainage. To suction. Skin irritation/erythema over abdomen, without blistering. Ext:  No edema or tenderness   Lab Results:   Recent Labs  07/29/15 0400  WBC 20.7*  HGB 8.2*  HCT 25.8*  PLT 439*   BMET  Recent Labs  07/28/15 0420 07/29/15 0400  NA 139 139  K 3.2* 4.2  CL 102 105  CO2 23 23  GLUCOSE 105* 96  BUN 77* 73*  CREATININE 2.02* 1.88*  CALCIUM 8.4* 8.0*   PT/INR No results for input(s): LABPROT, INR in the last 72 hours. CMP     Component Value Date/Time   NA 139 07/29/2015 0400   NA 141 02/09/2015 1120   K 4.2 07/29/2015 0400   K 4.5 02/09/2015 1120   CL 105 07/29/2015 0400   CL 97* 03/12/2012 1310   CO2 23 07/29/2015 0400   CO2 27 02/09/2015 1120   GLUCOSE 96 07/29/2015 0400   GLUCOSE 127 02/09/2015 1120   GLUCOSE 131* 03/12/2012 1310   BUN 73* 07/29/2015 0400   BUN 30.1* 02/09/2015 1120   CREATININE 1.88* 07/29/2015 0400   CREATININE 1.4* 02/09/2015 1120   CALCIUM 8.0* 07/29/2015 0400   CALCIUM 10.3 02/09/2015 1120   PROT 4.9* 07/27/2015 0419   PROT 7.4 02/09/2015 1120   ALBUMIN 1.2* 07/28/2015 0420   ALBUMIN 3.5 02/09/2015 1120   AST 22 07/27/2015 0419   AST 17 02/09/2015 1120   ALT 24 07/27/2015 0419   ALT 13 02/09/2015 1120   ALKPHOS 193* 07/27/2015 0419   ALKPHOS 74 02/09/2015 1120   BILITOT 0.3 07/27/2015 0419   BILITOT 0.35 02/09/2015 1120   GFRNONAA 26* 07/29/2015 0400   GFRAA 30* 07/29/2015 0400   Lipase     Component Value Date/Time   LIPASE 30 07/10/2015 0018       Studies/Results: Dg Chest Port 1 View  07/29/2015  CLINICAL DATA:  Respiratory failure EXAM: PORTABLE CHEST 1 VIEW COMPARISON:  09/26/2015 FINDINGS: Cardiomediastinal silhouette is stable. Endotracheal tube has been removed. Stable NG tube position. Study is limited by poor inspiration. Mild basilar atelectasis. No pulmonary edema. Mitral valve calcifications. Degenerative changes bilateral shoulders. Stable right arm PICC line position with tip in right atrium. IMPRESSION: NG tube in  place. Endotracheal tube has been removed. No active disease. Electronically Signed   By: Lahoma Crocker M.D.   On: 07/29/2015 08:17    Anti-infectives: Anti-infectives    Start     Dose/Rate Route Frequency Ordered Stop   07/19/15 1000  piperacillin-tazobactam (ZOSYN) IVPB 3.375 g     3.375 g 12.5 mL/hr over 240 Minutes Intravenous Every 8 hours 07/19/15 0828 07/28/15 1400   07/19/15 0608  dextrose 5 % with cefOXitin (MEFOXIN) ADS Med    Comments:  [name]   : cabinet override      07/19/15 [id number] 07/19/15 0629       Assessment/Plan Metastatic Labular  Breast Carcinoma EC fistula 07/27/15 Re-exploration of abdomen, creation of small bowel anastomosis, abdominal wall closure 07/21/15 (Dr.Wilson) Ex lap with resection of anastomosis and placement of wound vac 07/19/15 (Dr. Brantley Stage) Ex lap with SBR 07/10/15 (Dr. [place] Silos) Carcinomatosis  - complete comfort per palliative care: scheduled IV dilaudid, ativan, and benadryl   Skin irritation/contact dermatitis - remove gown and telemetry leads, scheduled benadryl.  Appreciate palliative care's support.   LOS: 11 days    Jill Alexanders , [facility] 07/30/2015, 8:40 AM Pager: [phone] Mon-Fri 7:00 am-4:30 pm Sat-Sun 7:00 am-11:30 am

## 2015-07-30 NOTE — Care Management Note (Signed)
Case Management Note  Patient Details  Name: Aleksa Arambul MRN: [id number] Date of Birth: [birth year]  Subjective/Objective:                    Action/Plan:  Comfort care . Dilaudid infusion started . Will follow  Expected Discharge Date:                  Expected Discharge Plan:     In-House Referral:  Chaplain, Hospice / Palliative Care  Discharge planning Services     Post Acute Care Choice:    Choice offered to:     DME Arranged:    DME Agency:     HH Arranged:    HH Agency:     Status of Service:  In process, will continue to follow  Medicare Important Message Given:  Yes Date Medicare IM Given:    Medicare IM give by:    Date Additional Medicare IM Given:    Additional Medicare Important Message give by:     If discussed at [place] of Stay Meetings, dates discussed:    Additional Comments:  Marilu Favre, RN 07/30/2015, 10:01 AM

## 2015-07-30 NOTE — Progress Notes (Signed)
Fentanyl gtt 150 mL wasted in sink with Gabriel Rainwater, RN.

## 2015-08-22 DEATH — deceased

## 2016-06-17 IMAGING — CT CT ABD-PELV W/O CM
2 of 4 series · 11 of 46 positions shown, 12 images · non-contrast
Comparison: 07/19/2015

CLINICAL DATA: Recent surgery for bowel perforation, drainage from
the wound

EXAM:
CT ABDOMEN AND PELVIS WITHOUT CONTRAST
TECHNIQUE: Multidetector CT imaging of the abdomen and pelvis was performed
following the standard protocol without IV contrast.

[Series 201: routine, idose (2) · axial · 0.84mm/px · z∈[+105,+495]mm · 8 of 94 slices shown, 9 images]
[im 8/94  soft-tissue]
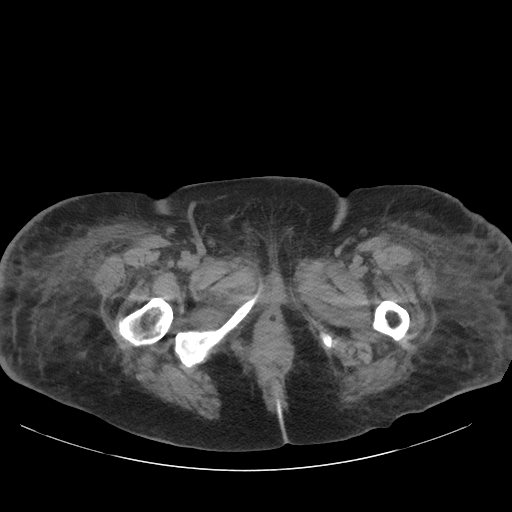
[im 8/94  bone]
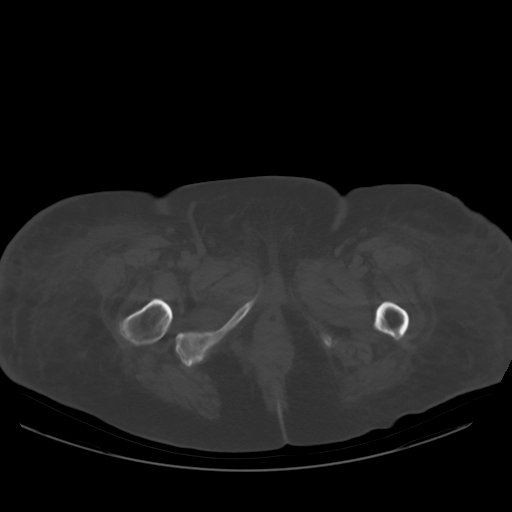
[im 19/94  soft-tissue]
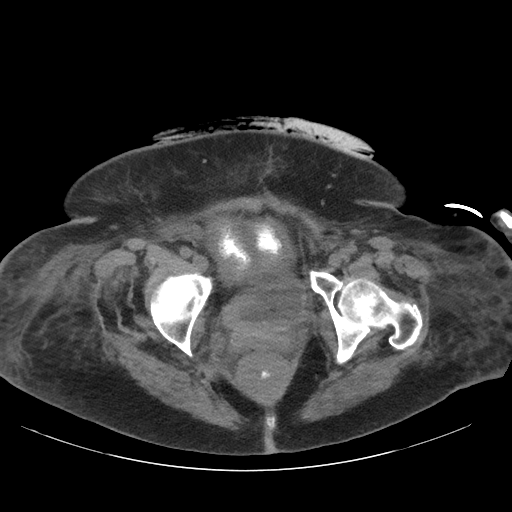
[im 30/94  soft-tissue]
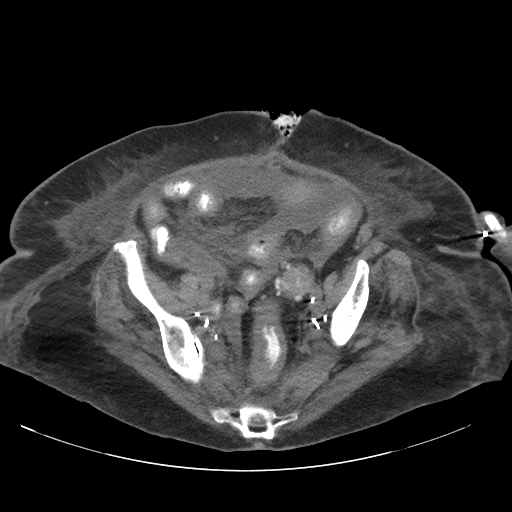
[im 41/94  soft-tissue]
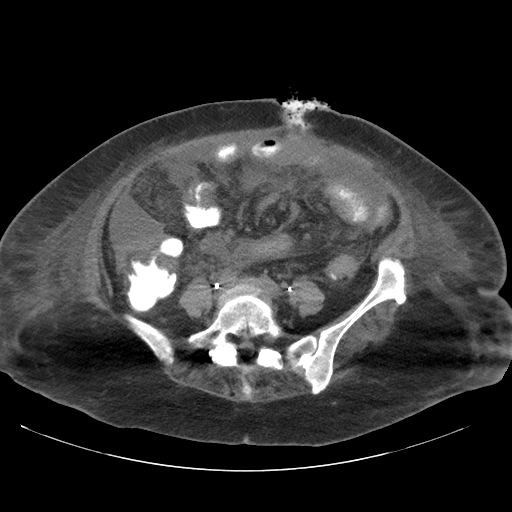
[im 53/94  soft-tissue]
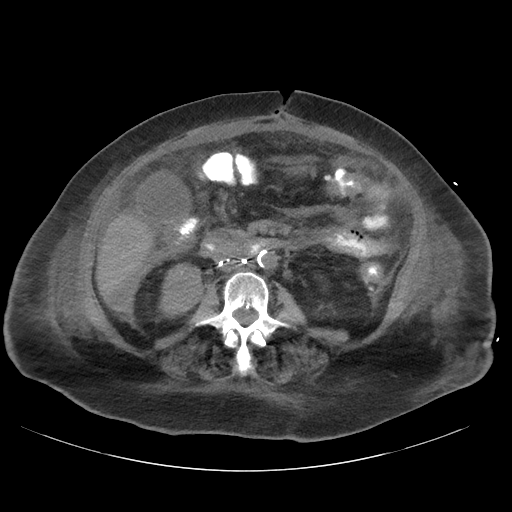
[im 64/94  soft-tissue]
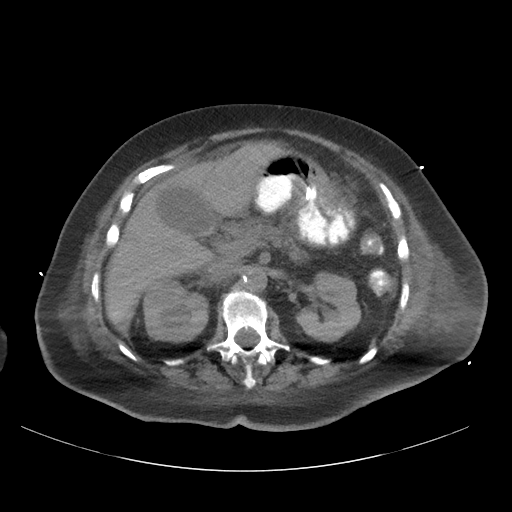
[im 75/94  soft-tissue]
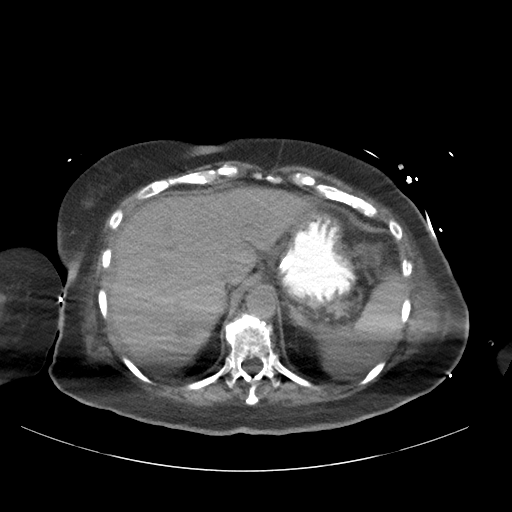
[im 86/94  soft-tissue]
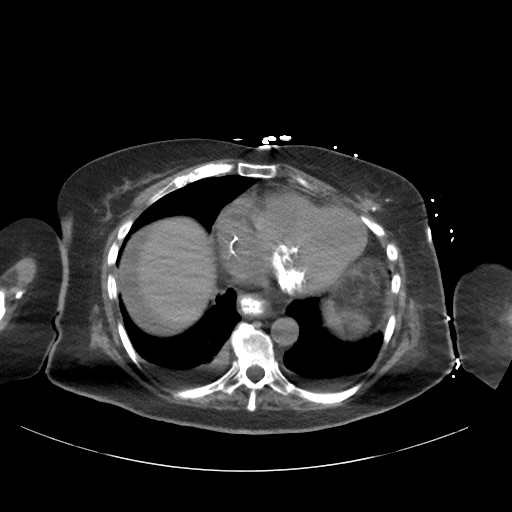

[Series 203: coronals, idose (2) · coronal · 0.45mm/px · 3 of 130 slices shown]
[im 44/130  soft-tissue]
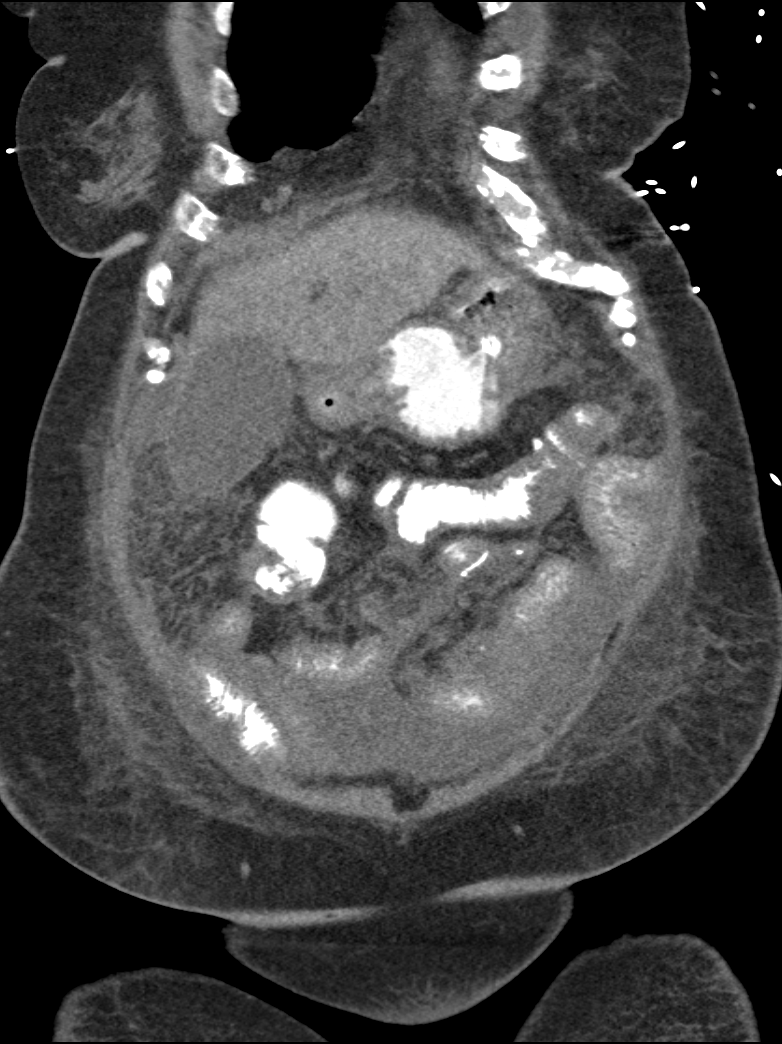
[im 58/130  soft-tissue]
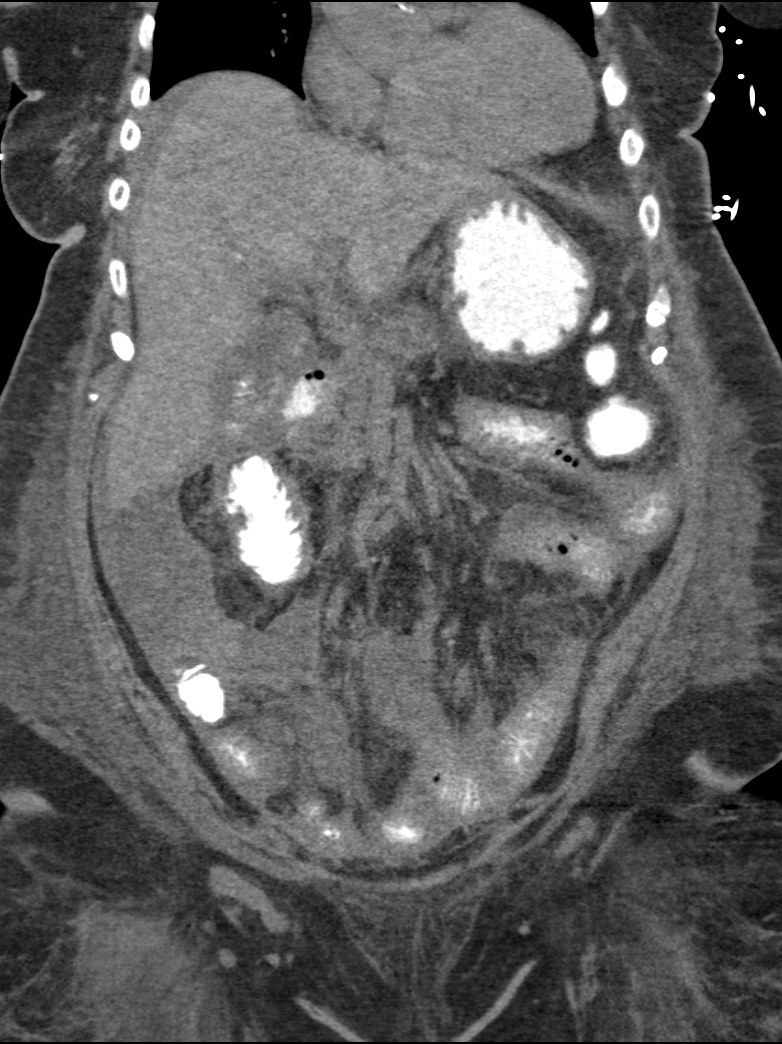
[im 72/130  soft-tissue]
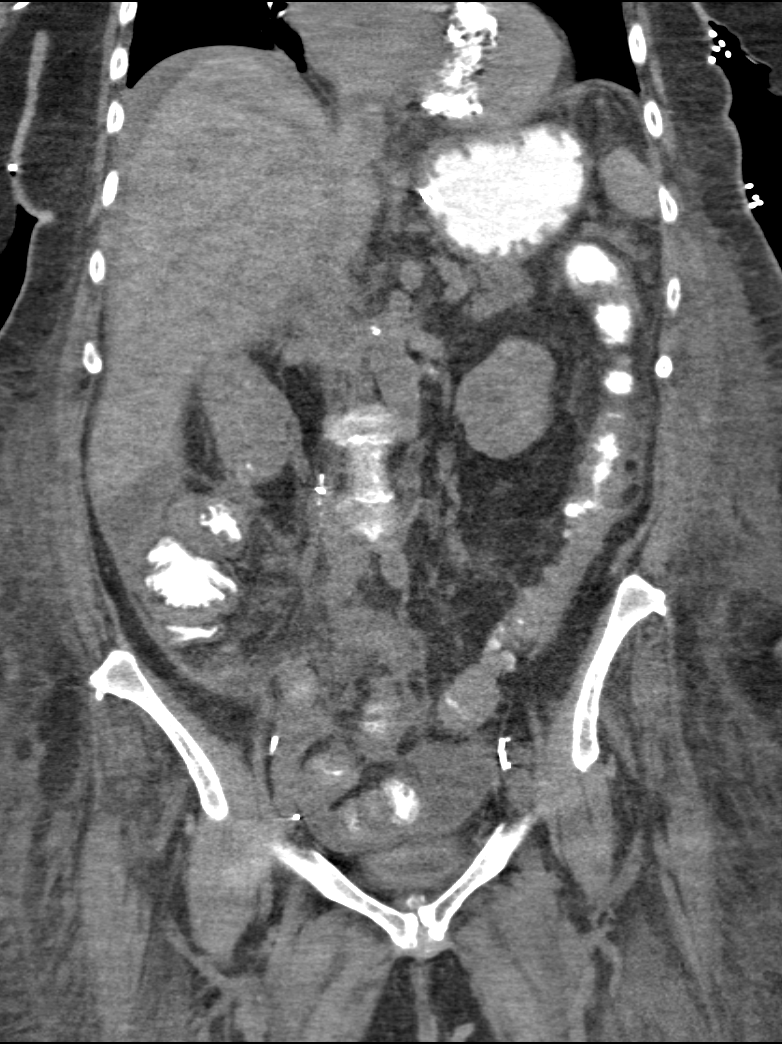

[11 of 46 positions shown; findings below may reference images not displayed]

FINDINGS: The study is limited without IV contrast. Small hiatal hernia is
noted. Bilateral small pleural effusion. Mitral valve calcifications
are noted.

Unenhanced liver shows no biliary ductal dilatation. Small
perihepatic ascites. Mild distended gallbladder. Small layering
calcified gallstones are noted within gallbladder.

Unenhanced pancreas is unremarkable.

Unenhanced spleen is unremarkable.

Oral contrast material was given to the patient. No gastric outlet
obstruction. There is a midline lower abdominal open wound. Contrast
material is noted within the open wound. There are postsurgical
changes in mid lower small bowel. Axial image 49 there is
extravasation of the contrast material from small bowel in mid
anterior small bowel mesentery and probable there is a fistulous
tract into the open abdominal wound. Please see sagittal image 80.
There is diffuse thickening of small bowel wall highly suspicious
for diffuse enteritis. There is mesenteric edema and small free
fluid noted within lower mesentery bilaterally. No evidence of small
bowel obstruction. There is contrast material noted in descending
colon and sigmoid colon. Few sigmoid colon diverticula. There is
thickening of distal sigmoid colon and rectal wall. Findings highly
suspicious for distal colitis. Thickening of urinary bladder wall.
Cystitis cannot be excluded. The urinary bladder is decompressed.
Probable Foley catheter within urinary bladder.

There is anasarca infiltration of subcutaneous fat bilateral pelvic
and lower abdominal wall.

Again noted chronic right hydronephrosis. Study is limited without
IV contrast. No ureteral calcified calculi are noted. Mild left
hydronephrosis.

The patient is status post hysterectomy. Degenerative changes are
noted thoracolumbar spine
IMPRESSION: 1. There is oral contrast material in open abdominal wound anterior
abdomen. There is contrast extravasation from small bowel at the
anastomosis site in mid anterior abdomen under abdominal fascia
supraumbilical region please see sagittal image 80. See axial image
48. There is probable fistulous tract connecting with the abdominal
wound. Findings highly suspicious for enterocutaneous fistula.
2. No evidence of small bowel obstruction. There is diffuse
thickening of small bowel wall highly suspicious for diffuse
enteritis.
3. Contrast material is noted within distal colon and rectum. No
evidence of distal colonic obstruction. There is thickening of
distal sigmoid colon wall and rectum. Colitis or inflammatory
process cannot be excluded. Clinical correlation is necessary.
4. Thickening of urinary bladder wall. Satellite cystitis cannot be
excluded.
5. Again noted chronic mild right hydronephrosis. Mild left
hydronephrosis.
6. Bilateral small pleural effusion with bilateral basilar posterior
atelectasis.
7. Small perihepatic ascites. Small ascites and mesenteric edema in
lower abdomen and pelvis.
These results were called by telephone at the time of interpretation
on 07/27/2015 at [DATE] to Dr. DANITZA ZOE , who verbally
acknowledged these results.

## 2016-06-17 IMAGING — CR DG CHEST 1V PORT
1 series · 1 of 1 positions shown · non-contrast
Comparison: 07/26/2015

CLINICAL DATA: Respiratory failure

EXAM:
PORTABLE CHEST 1 VIEW

[AP]
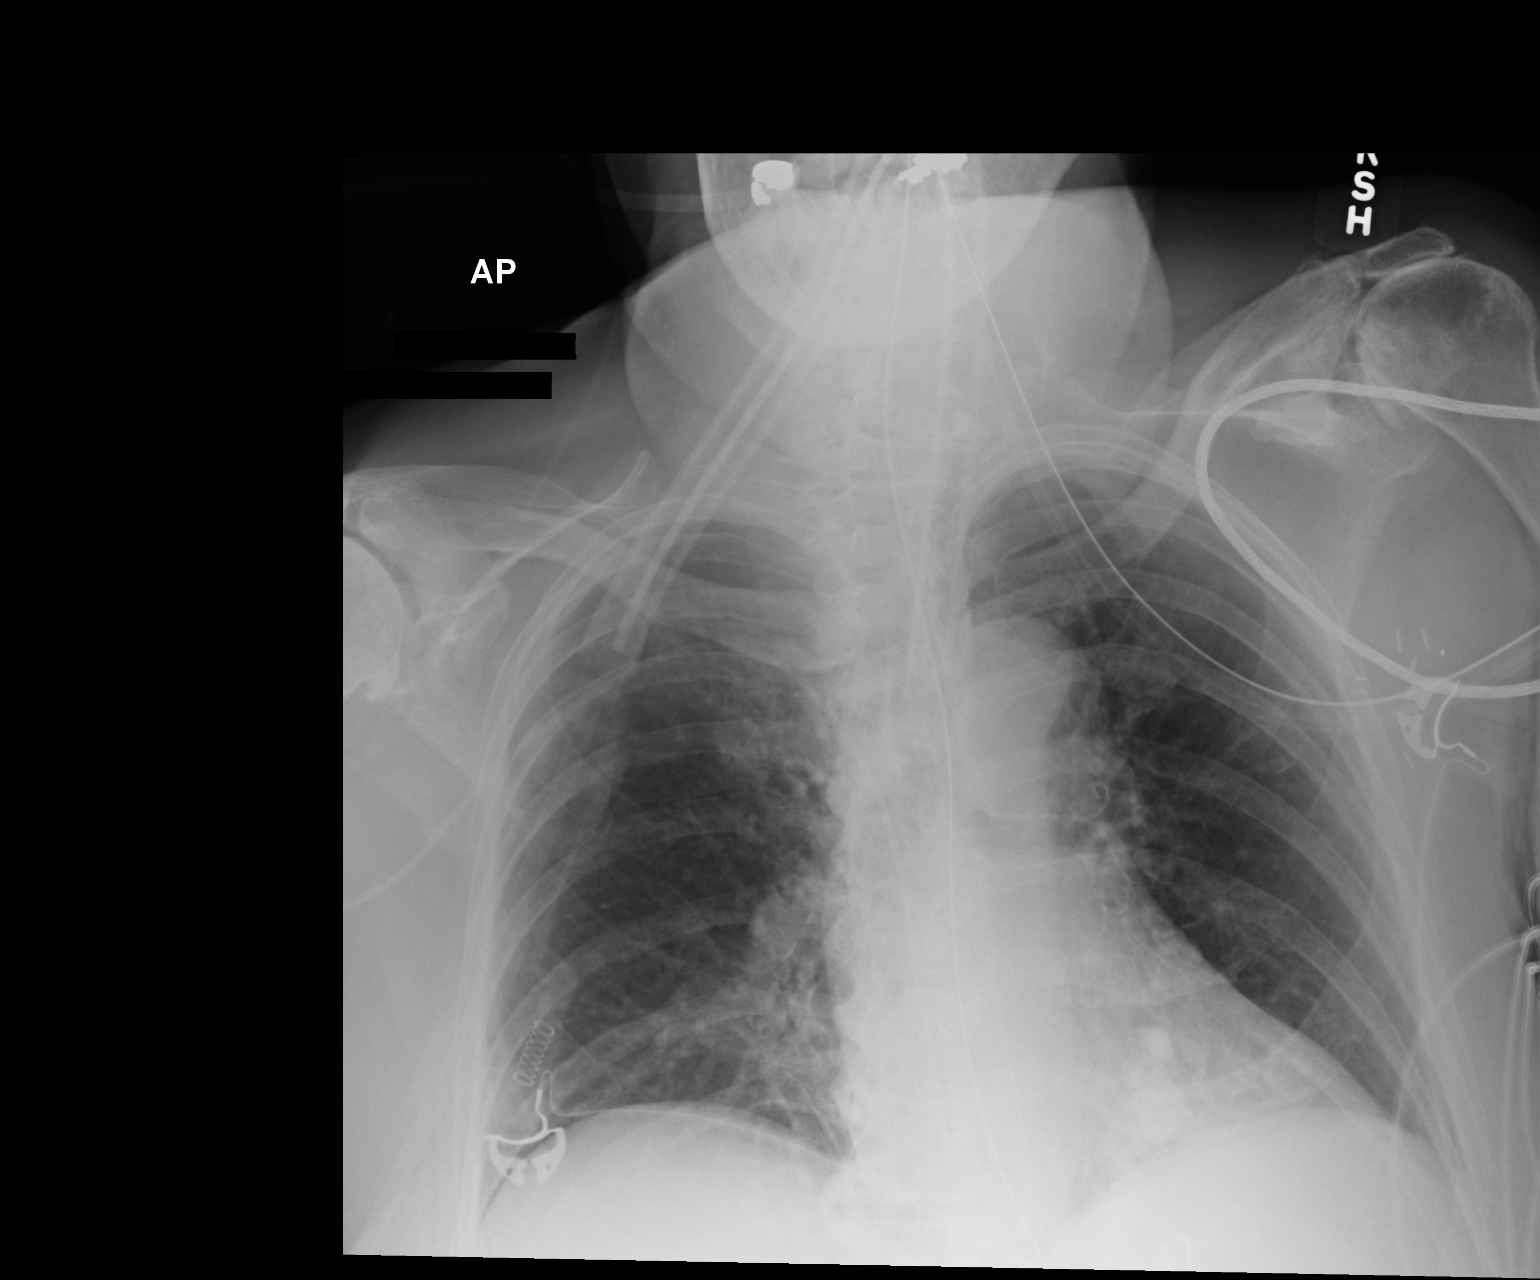

[1 of 1 positions shown; findings below may reference images not displayed]

FINDINGS: Support devices are stable. Heart is normal size. Mitral valve
calcifications present. No confluent airspace opacity or effusion.
IMPRESSION: No acute cardiopulmonary disease.

## 2016-06-19 IMAGING — CR DG CHEST 1V PORT
1 series · 1 of 1 positions shown · non-contrast
Comparison: 09/26/2015

CLINICAL DATA: Respiratory failure

EXAM:
PORTABLE CHEST 1 VIEW

[AP]
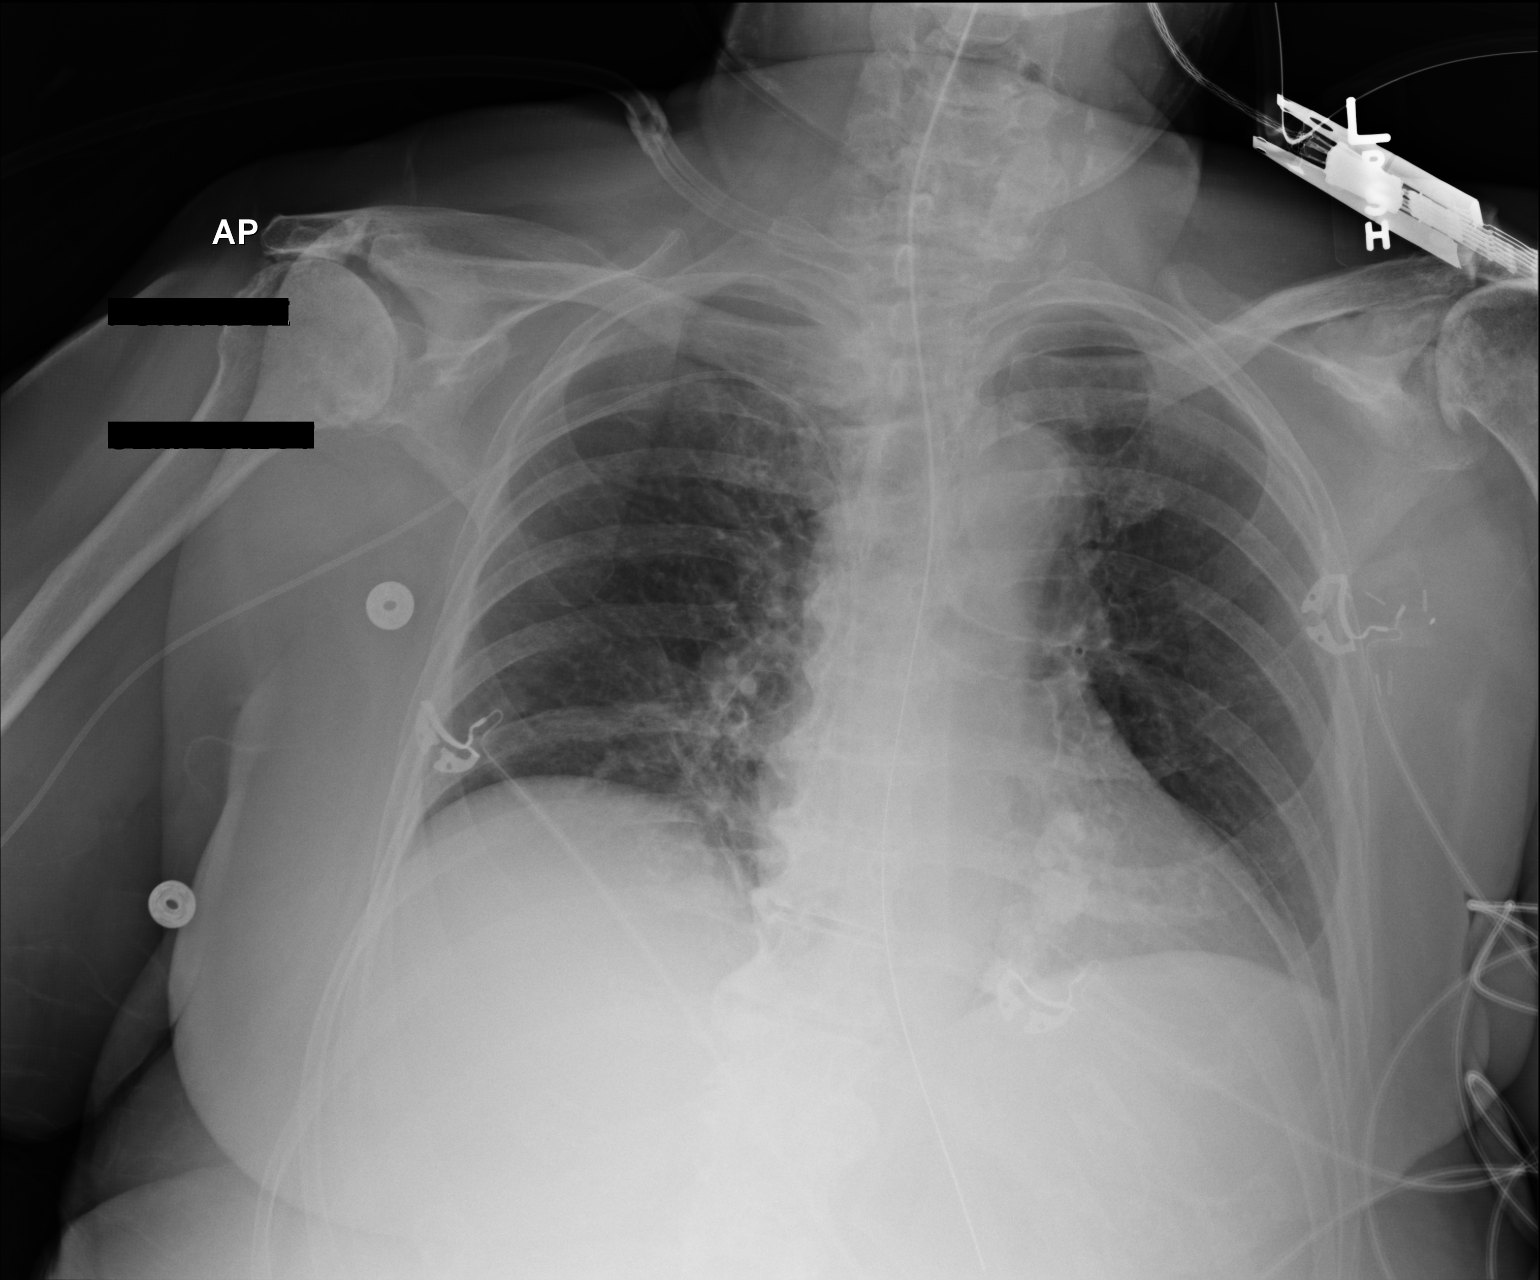

[1 of 1 positions shown; findings below may reference images not displayed]

FINDINGS: Cardiomediastinal silhouette is stable. Endotracheal tube has been
removed. Stable NG tube position. Study is limited by poor
inspiration. Mild basilar atelectasis. No pulmonary edema. Mitral
valve calcifications. Degenerative changes bilateral shoulders.
Stable right arm PICC line position with tip in right atrium.
IMPRESSION: NG tube in place. Endotracheal tube has been removed. No active
disease.
